# Patient Record
Sex: Male | Born: 1940 | Race: White | Hispanic: No | Marital: Married | Smoking: Former smoker
Health system: Southern US, Community
[De-identification: ages and names within clinical notes are randomized; demographics above are authoritative.]

## PROBLEM LIST (undated history)

## (undated) DIAGNOSIS — Z951 Presence of aortocoronary bypass graft: Secondary | ICD-10-CM

## (undated) DIAGNOSIS — E785 Hyperlipidemia, unspecified: Secondary | ICD-10-CM

## (undated) DIAGNOSIS — IMO0002 Reserved for concepts with insufficient information to code with codable children: Secondary | ICD-10-CM

## (undated) DIAGNOSIS — IMO0001 Reserved for inherently not codable concepts without codable children: Secondary | ICD-10-CM

## (undated) DIAGNOSIS — I350 Nonrheumatic aortic (valve) stenosis: Secondary | ICD-10-CM

## (undated) DIAGNOSIS — Z9289 Personal history of other medical treatment: Secondary | ICD-10-CM

## (undated) DIAGNOSIS — E119 Type 2 diabetes mellitus without complications: Secondary | ICD-10-CM

## (undated) DIAGNOSIS — C159 Malignant neoplasm of esophagus, unspecified: Secondary | ICD-10-CM

## (undated) DIAGNOSIS — E039 Hypothyroidism, unspecified: Secondary | ICD-10-CM

## (undated) DIAGNOSIS — K8020 Calculus of gallbladder without cholecystitis without obstruction: Secondary | ICD-10-CM

## (undated) DIAGNOSIS — I10 Essential (primary) hypertension: Secondary | ICD-10-CM

## (undated) DIAGNOSIS — I2510 Atherosclerotic heart disease of native coronary artery without angina pectoris: Secondary | ICD-10-CM

## (undated) HISTORY — DX: Hyperlipidemia, unspecified: E78.5

## (undated) HISTORY — DX: Type 2 diabetes mellitus without complications: E11.9

## (undated) HISTORY — DX: Atherosclerotic heart disease of native coronary artery without angina pectoris: I25.10

## (undated) HISTORY — DX: Personal history of other medical treatment: Z92.89

## (undated) HISTORY — DX: Hypothyroidism, unspecified: E03.9

## (undated) HISTORY — DX: Presence of aortocoronary bypass graft: Z95.1

## (undated) HISTORY — DX: Essential (primary) hypertension: I10

## (undated) HISTORY — DX: Nonrheumatic aortic (valve) stenosis: I35.0

## (undated) HISTORY — DX: Calculus of gallbladder without cholecystitis without obstruction: K80.20

---

## 1999-04-12 ENCOUNTER — Encounter: Payer: Self-pay | Admitting: *Deleted

## 1999-04-12 ENCOUNTER — Ambulatory Visit (HOSPITAL_COMMUNITY): Admission: RE | Admit: 1999-04-12 | Discharge: 1999-04-12 | Payer: Self-pay | Admitting: *Deleted

## 2000-07-09 HISTORY — PX: CORONARY ARTERY BYPASS GRAFT: SHX141

## 2000-11-01 ENCOUNTER — Ambulatory Visit (HOSPITAL_COMMUNITY): Admission: RE | Admit: 2000-11-01 | Discharge: 2000-11-01 | Payer: Self-pay | Admitting: *Deleted

## 2000-11-01 ENCOUNTER — Encounter: Payer: Self-pay | Admitting: *Deleted

## 2001-01-08 ENCOUNTER — Encounter: Payer: Self-pay | Admitting: Surgery

## 2001-01-13 ENCOUNTER — Inpatient Hospital Stay (HOSPITAL_COMMUNITY): Admission: RE | Admit: 2001-01-13 | Discharge: 2001-01-17 | Payer: Self-pay | Admitting: Surgery

## 2001-01-13 ENCOUNTER — Encounter: Payer: Self-pay | Admitting: Surgery

## 2001-01-14 ENCOUNTER — Encounter: Payer: Self-pay | Admitting: Surgery

## 2001-02-18 ENCOUNTER — Encounter (HOSPITAL_COMMUNITY): Admission: RE | Admit: 2001-02-18 | Discharge: 2001-05-19 | Payer: Self-pay | Admitting: *Deleted

## 2001-10-09 ENCOUNTER — Encounter: Payer: Self-pay | Admitting: *Deleted

## 2001-10-09 ENCOUNTER — Ambulatory Visit (HOSPITAL_COMMUNITY): Admission: RE | Admit: 2001-10-09 | Discharge: 2001-10-09 | Payer: Self-pay | Admitting: *Deleted

## 2007-03-20 ENCOUNTER — Ambulatory Visit: Payer: Self-pay | Admitting: Cardiology

## 2007-03-31 ENCOUNTER — Encounter: Payer: Self-pay | Admitting: Cardiology

## 2007-03-31 ENCOUNTER — Ambulatory Visit: Payer: Self-pay

## 2007-03-31 ENCOUNTER — Encounter: Payer: Self-pay | Admitting: Cardiovascular Disease

## 2007-04-02 ENCOUNTER — Ambulatory Visit: Payer: Self-pay

## 2008-02-03 ENCOUNTER — Ambulatory Visit: Payer: Self-pay | Admitting: Gastroenterology

## 2008-02-17 ENCOUNTER — Encounter: Payer: Self-pay | Admitting: Family Medicine

## 2008-02-17 ENCOUNTER — Ambulatory Visit: Payer: Self-pay | Admitting: Gastroenterology

## 2008-02-17 ENCOUNTER — Encounter: Payer: Self-pay | Admitting: Gastroenterology

## 2008-02-20 ENCOUNTER — Encounter: Payer: Self-pay | Admitting: Gastroenterology

## 2008-03-22 ENCOUNTER — Ambulatory Visit: Payer: Self-pay | Admitting: Cardiology

## 2008-04-13 DIAGNOSIS — I2510 Atherosclerotic heart disease of native coronary artery without angina pectoris: Secondary | ICD-10-CM

## 2008-04-13 DIAGNOSIS — I10 Essential (primary) hypertension: Secondary | ICD-10-CM

## 2008-04-13 HISTORY — DX: Essential (primary) hypertension: I10

## 2008-04-13 HISTORY — DX: Atherosclerotic heart disease of native coronary artery without angina pectoris: I25.10

## 2008-10-25 ENCOUNTER — Ambulatory Visit: Payer: Self-pay | Admitting: Internal Medicine

## 2009-01-25 ENCOUNTER — Encounter (INDEPENDENT_AMBULATORY_CARE_PROVIDER_SITE_OTHER): Payer: Self-pay | Admitting: *Deleted

## 2009-03-13 ENCOUNTER — Encounter: Payer: Self-pay | Admitting: Family Medicine

## 2009-04-05 ENCOUNTER — Encounter: Payer: Self-pay | Admitting: Family Medicine

## 2009-10-31 ENCOUNTER — Ambulatory Visit: Payer: Self-pay | Admitting: Family Medicine

## 2010-05-08 ENCOUNTER — Ambulatory Visit: Payer: Self-pay | Admitting: Family Medicine

## 2010-05-08 DIAGNOSIS — E039 Hypothyroidism, unspecified: Secondary | ICD-10-CM

## 2010-05-08 DIAGNOSIS — E7800 Pure hypercholesterolemia, unspecified: Secondary | ICD-10-CM

## 2010-05-08 DIAGNOSIS — E119 Type 2 diabetes mellitus without complications: Secondary | ICD-10-CM

## 2010-05-08 DIAGNOSIS — E785 Hyperlipidemia, unspecified: Secondary | ICD-10-CM

## 2010-05-08 DIAGNOSIS — E1169 Type 2 diabetes mellitus with other specified complication: Secondary | ICD-10-CM

## 2010-05-08 HISTORY — DX: Type 2 diabetes mellitus without complications: E11.9

## 2010-05-08 HISTORY — DX: Hyperlipidemia, unspecified: E78.5

## 2010-05-08 HISTORY — DX: Hypothyroidism, unspecified: E03.9

## 2010-05-08 LAB — CONVERTED CEMR LAB
Cholesterol, target level: 200 mg/dL
HDL goal, serum: 40 mg/dL
LDL Goal: 70 mg/dL

## 2010-05-11 ENCOUNTER — Telehealth: Payer: Self-pay | Admitting: Family Medicine

## 2010-05-15 ENCOUNTER — Ambulatory Visit: Payer: Self-pay | Admitting: Family Medicine

## 2010-05-15 LAB — CONVERTED CEMR LAB
ALT: 22 units/L (ref 0–53)
AST: 31 units/L (ref 0–37)
Albumin: 4.2 g/dL (ref 3.5–5.2)
Alkaline Phosphatase: 50 units/L (ref 39–117)
BUN: 22 mg/dL (ref 6–23)
Basophils Absolute: 0 10*3/uL (ref 0.0–0.1)
Basophils Relative: 0.6 % (ref 0.0–3.0)
Bilirubin Urine: NEGATIVE
Bilirubin, Direct: 0.2 mg/dL (ref 0.0–0.3)
Blood in Urine, dipstick: NEGATIVE
CO2: 28 meq/L (ref 19–32)
Calcium: 9.7 mg/dL (ref 8.4–10.5)
Chloride: 105 meq/L (ref 96–112)
Cholesterol: 110 mg/dL (ref 0–200)
Creatinine, Ser: 1.2 mg/dL (ref 0.4–1.5)
Eosinophils Absolute: 0.2 10*3/uL (ref 0.0–0.7)
Eosinophils Relative: 2.5 % (ref 0.0–5.0)
GFR calc non Af Amer: 67.39 mL/min (ref >60)
Glucose, Bld: 88 mg/dL (ref 70–99)
Glucose, Urine, Semiquant: NEGATIVE
HCT: 41.7 % (ref 39.0–52.0)
HDL: 31.3 mg/dL — ABNORMAL LOW (ref >39.00)
Hemoglobin: 14.2 g/dL (ref 13.0–17.0)
Ketones, urine, test strip: NEGATIVE
LDL Cholesterol: 54 mg/dL (ref 0–99)
Lymphocytes Relative: 20.2 % (ref 12.0–46.0)
Lymphs Abs: 1.4 10*3/uL (ref 0.7–4.0)
MCHC: 34.1 g/dL (ref 30.0–36.0)
MCV: 92.3 fL (ref 78.0–100.0)
Monocytes Absolute: 0.8 10*3/uL (ref 0.1–1.0)
Monocytes Relative: 10.7 % (ref 3.0–12.0)
Neutro Abs: 4.7 10*3/uL (ref 1.4–7.7)
Neutrophils Relative %: 66 % (ref 43.0–77.0)
Nitrite: NEGATIVE
PSA: 1.18 ng/mL (ref 0.10–4.00)
Platelets: 189 10*3/uL (ref 150.0–400.0)
Potassium: 4.3 meq/L (ref 3.5–5.1)
Protein, U semiquant: NEGATIVE
RBC: 4.52 M/uL (ref 4.22–5.81)
RDW: 12.9 % (ref 11.5–14.6)
Sodium: 142 meq/L (ref 135–145)
Specific Gravity, Urine: <1.005
TSH: 0.89 microintl units/mL (ref 0.35–5.50)
Total Bilirubin: 0.9 mg/dL (ref 0.3–1.2)
Total CHOL/HDL Ratio: 4
Total Protein: 6.9 g/dL (ref 6.0–8.3)
Triglycerides: 125 mg/dL (ref 0.0–149.0)
Urobilinogen, UA: 0.2
VLDL: 25 mg/dL (ref 0.0–40.0)
WBC Urine, dipstick: NEGATIVE
WBC: 7.1 10*3/uL (ref 4.5–10.5)
pH: 5

## 2010-05-22 ENCOUNTER — Ambulatory Visit: Payer: Self-pay | Admitting: Family Medicine

## 2010-07-14 ENCOUNTER — Encounter: Payer: Self-pay | Admitting: Family Medicine

## 2010-07-18 ENCOUNTER — Encounter: Payer: Self-pay | Admitting: Family Medicine

## 2010-07-31 ENCOUNTER — Ambulatory Visit: Admit: 2010-07-31 | Payer: Self-pay | Admitting: Family Medicine

## 2010-08-08 NOTE — Assessment & Plan Note (Signed)
Summary: BRAND NEW PT/TO EST/CJR   Vital Signs:  Patient profile:   70 year old male Height:      70.75 inches Weight:      268 pounds BMI:     37.78 Temp:     97.9 degrees F oral Pulse rate:   60 / minute Pulse rhythm:   regular Resp:     12 per minute BP sitting:   150 / 80  (left arm) Cuff size:   large  Vitals Entered By: Sid Falcon LPN (May 08, 2010 2:07 PM)  Nutrition Counseling: Patient's BMI is greater than 25 and therefore counseled on weight management options. CC: Hypertension Management, Lipid Management   History of Present Illness: Patient seen to establish care.  Past medical history significant for CAD with history of coronary artery bypass graft 2002. He also has history of hypothyroidism, hypertension, hyperlipidemia. Patient takes several medications and these are reviewed. He is compliant with all. Also prior history of left cataract surgery.  Family history reviewed. Significant for hyperlipidemia and hypertension. Mother with type 2 diabetes.  Patient also relates history of type 2 diabetes which has been managed with diet alone. last CPE about one year ago and old records reviewed.  He is a retired Charity fundraiser. He is married. Nonsmoker.  Hypertension History:      He denies headache, chest pain, palpitations, dyspnea with exertion, orthopnea, peripheral edema, visual symptoms, neurologic problems, syncope, and side effects from treatment.        Positive major cardiovascular risk factors include male age 42 years old or older, diabetes, hyperlipidemia, and hypertension.  Negative major cardiovascular risk factors include non-tobacco-user status.        Positive history for target organ damage include ASHD (either angina/prior MI/prior CABG).  Further assessment for target organ damage reveals no history of stroke/TIA or peripheral vascular disease.    Lipid Management History:      Positive NCEP/ATP III risk factors include male age 75 years old or  older, diabetes, hypertension, and ASHD (either angina/prior MI/prior CABG).  Negative NCEP/ATP III risk factors include non-tobacco-user status, no prior stroke/TIA, no peripheral vascular disease, and no history of aortic aneurysm.      Preventive Screening-Counseling & Management  Alcohol-Tobacco     Smoking Status: quit < 6 months     Packs/Day: 1.5     Year Started: 1962     Year Quit: 2001  Allergies (verified): No Known Drug Allergies  Past History:  Past Surgical History: Last updated: 04/13/2008 CABG 2002  Family History: Last updated: 05/08/2010 Family History of Coronary Artery Disease: in brother and mother Family History of Alcoholism/Addiction, colon CA,father Mother, diabetes type ll, hypertension  Social History: Last updated: 05/08/2010 Tobacco Use - Former. Quit 2002 Alcohol Use - yes occasional Retired Charity fundraiser Alcohol use-yes Quit smoking 2001 Married  Risk Factors: Smoking Status: quit < 6 months (05/08/2010) Packs/Day: 1.5 (05/08/2010)  Past Medical History: CAD Hyperlipidemia Hypertension Hypothyroidism radiation Type 2 DM diet managed PMH-FH-SH reviewed for relevance  Family History: Family History of Coronary Artery Disease: in brother and mother Family History of Alcoholism/Addiction, colon CA,father Mother, diabetes type ll, hypertension  Social History: Tobacco Use - Former. Quit 2002 Alcohol Use - yes occasional Retired Charity fundraiser Alcohol use-yes Quit smoking 2001 Married Smoking Status:  quit < 6 months Packs/Day:  1.5  Review of Systems  The patient denies anorexia, fever, weight loss, vision loss, chest pain, syncope, dyspnea on exertion, peripheral edema, prolonged cough, headaches, hemoptysis, abdominal  pain, melena, hematochezia, severe indigestion/heartburn, hematuria, muscle weakness, and depression.    Physical Exam  General:  Well-developed,well-nourished,in no acute distress; alert,appropriate and cooperative  throughout examination Eyes:  pupils equal, pupils round, and pupils reactive to light.   Mouth:  Oral mucosa and oropharynx without lesions or exudates.  Teeth in good repair. Neck:  No deformities, masses, or tenderness noted. Lungs:  Normal respiratory effort, chest expands symmetrically. Lungs are clear to auscultation, no crackles or wheezes. Heart:  normal rate, regular rhythm, and no gallop.  2/6 systolic murmur right upper sternal border and left sternal border Extremities:  No clubbing, cyanosis, edema, or deformity noted with normal full range of motion of all joints.   Neurologic:  alert & oriented X3 and cranial nerves II-XII intact.   Psych:  normally interactive, good eye contact, not anxious appearing, and not depressed appearing.     Impression & Recommendations:  Problem # 1:  CAD, NATIVE VESSEL (ICD-414.01)  His updated medication list for this problem includes:    Diovan Hct 80-12.5 Mg Tabs (Valsartan-hydrochlorothiazide) ..... Once daily    Metoprolol Tartrate 50 Mg Tabs (Metoprolol tartrate) ..... Once daily    Amlodipine Besy-benazepril Hcl 5-20 Mg Caps (Amlodipine besy-benazepril hcl) ..... Once daily  Problem # 2:  HYPERTENSION, BENIGN (ICD-401.1)  His updated medication list for this problem includes:    Diovan Hct 80-12.5 Mg Tabs (Valsartan-hydrochlorothiazide) ..... Once daily    Metoprolol Tartrate 50 Mg Tabs (Metoprolol tartrate) ..... Once daily    Amlodipine Besy-benazepril Hcl 5-20 Mg Caps (Amlodipine besy-benazepril hcl) ..... Once daily  Problem # 3:  HYPERLIPIDEMIA (ICD-272.4) refilled med and he will schedule lab. His updated medication list for this problem includes:    Vytorin 10-40 Mg Tabs (Ezetimibe-simvastatin) ..... Once daily  Problem # 4:  HYPOTHYROIDISM (ICD-244.9)  His updated medication list for this problem includes:    Synthroid 50 Mcg Tabs (Levothyroxine sodium) ..... Once daily    Synthroid 200 Mcg Tabs (Levothyroxine sodium)  ..... Once daily  Problem # 5:  DM (ICD-250.00)  His updated medication list for this problem includes:    Diovan Hct 80-12.5 Mg Tabs (Valsartan-hydrochlorothiazide) ..... Once daily    Amlodipine Besy-benazepril Hcl 5-20 Mg Caps (Amlodipine besy-benazepril hcl) ..... Once daily  Complete Medication List: 1)  Synthroid 50 Mcg Tabs (Levothyroxine sodium) .... Once daily 2)  Synthroid 200 Mcg Tabs (Levothyroxine sodium) .... Once daily 3)  Diovan Hct 80-12.5 Mg Tabs (Valsartan-hydrochlorothiazide) .... Once daily 4)  Vytorin 10-40 Mg Tabs (Ezetimibe-simvastatin) .... Once daily 5)  Metoprolol Tartrate 50 Mg Tabs (Metoprolol tartrate) .... Once daily 6)  Betamethasone Dipropionate 0.05 % Lotn (Betamethasone dipropionate) .... As needed 7)  Amlodipine Besy-benazepril Hcl 5-20 Mg Caps (Amlodipine besy-benazepril hcl) .... Once daily  Hypertension Assessment/Plan:      The patient's hypertensive risk group is category C: Target organ damage and/or diabetes.  Today's blood pressure is 150/80.    Lipid Assessment/Plan:      Based on NCEP/ATP III, the patient's risk factor category is "history of coronary disease, peripheral vascular disease, cerebrovascular disease, or aortic aneurysm along with either diabetes, current smoker, or LDL > 130 plus HDL < 40 plus triglycerides > 200".  The patient's lipid goals are as follows: Total cholesterol goal is 200; LDL cholesterol goal is 70; HDL cholesterol goal is 40; Triglyceride goal is 150.    Patient Instructions: 1)  Schedule complete physical examination Prescriptions: VYTORIN 10-40 MG TABS (EZETIMIBE-SIMVASTATIN) once daily  #90 x 3  Entered and Authorized by:   Evelena Peat MD   Signed by:   Evelena Peat MD on 05/08/2010   Method used:   Faxed to ...       [facility]       [address] [place], [place]  [address]       Ph: [phone]       Fax: [phone]   RxID:   [id number]    Orders Added: 1)  New  Patient Level III [84132]    Preventive Care Screening  Last Pneumovax:    Date:  07/09/2008    Results:  given   Last Tetanus Booster:    Date:  07/09/2008    Results:  Historical

## 2010-08-08 NOTE — Assessment & Plan Note (Signed)
Summary: cpx/ccm   Vital Signs:  Patient profile:   70 year old male Height:      71 inches Weight:      264 pounds Temp:     97.8 degrees F oral Pulse rate:   60 / minute Pulse rhythm:   regular Resp:     12 per minute BP sitting:   140 / 70  (left arm) Cuff size:   large  Vitals Entered By: Sid Falcon LPN (May 22, 2010 11:39 AM)  History of Present Illness: Here for CPE.  Has received flu vaccine.  Colonoscopy 2009. Recently started exercising 20-30 minutes most days of week. Has lost about 4-5 pounds since last visit.  Tetanus and PVX up to date.  PMH reviewed.  Hx CABG 2002.  Hyperlipidemia treated for several years with Vytorin 40 mg. Also on amlodipine but pt has never had any problems with myalgias on this combo.  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  Type 2 diabetes which has been diet controlled.  Hypertension and meds reviewed.  Compliant with all with no side effects.  Diabetes Management History:      He has not been enrolled in the "Diabetic Education Program".  He states understanding of dietary principles and is following his diet appropriately.  No sensory loss is reported.  Self foot exams are being performed.  He is checking home blood sugars.  He says that he is exercising.        Hypoglycemic symptoms are not occurring.  No hyperglycemic symptoms are reported.    Hypertension History:      He denies headache, chest pain, palpitations, dyspnea with exertion, orthopnea, PND, peripheral edema, visual symptoms, neurologic problems, syncope, and side effects from treatment.        Positive major cardiovascular risk factors include male age 45 years old or older, diabetes, hyperlipidemia, and hypertension.  Negative major cardiovascular risk factors include non-tobacco-user status.        Positive history for target organ damage include ASHD (either angina/prior MI/prior CABG).  Further assessment for target organ damage reveals no  history of stroke/TIA or peripheral vascular disease.    Lipid Management History:      Positive NCEP/ATP III risk factors include male age 68 years old or older, diabetes, HDL cholesterol less than 40, hypertension, and ASHD (either angina/prior MI/prior CABG).  Negative NCEP/ATP III risk factors include non-tobacco-user status, no prior stroke/TIA, no peripheral vascular disease, and no history of aortic aneurysm.      Preventive Screening-Counseling & Management  Caffeine-Diet-Exercise     Does Patient Exercise: yes  Allergies (verified): No Known Drug Allergies  Past History:  Past Medical History: Last updated: 05/08/2010 CAD Hyperlipidemia Hypertension Hypothyroidism radiation Type 2 DM diet managed  Past Surgical History: Last updated: 04/13/2008 CABG 2002  Family History: Last updated: 05/08/2010 Family History of Coronary Artery Disease: in brother and mother Family History of Alcoholism/Addiction, colon CA,father Mother, diabetes type ll, hypertension  Social History: Last updated: 05/08/2010 Tobacco Use - Former. Quit 2002 Alcohol Use - yes occasional Retired Charity fundraiser Alcohol use-yes Quit smoking 2001 Married  Risk Factors: Smoking Status: quit < 6 months (05/08/2010) Packs/Day: 1.5 (05/08/2010) PMH-FH-SH reviewed for relevance  Social History: Does Patient Exercise:  yes  Review of Systems  The patient denies anorexia, fever, weight gain, vision loss, decreased hearing, hoarseness, chest pain, syncope, dyspnea on exertion, peripheral edema, prolonged cough, headaches, hemoptysis, abdominal pain, melena, hematochezia, severe indigestion/heartburn, hematuria, incontinence, muscle weakness,  suspicious skin lesions, transient blindness, depression, and enlarged lymph nodes.    Physical Exam  General:  Well-developed,well-nourished,in no acute distress; alert,appropriate and cooperative throughout examination Head:  Normocephalic and atraumatic without  obvious abnormalities. No apparent alopecia or balding. Eyes:  pupils equal, pupils round, pupils reactive to light, no optic disk abnormalities, and no retinal abnormalitiies.   Ears:  External ear exam shows no significant lesions or deformities.  Otoscopic examination reveals clear canals, tympanic membranes are intact bilaterally without bulging, retraction, inflammation or discharge. Hearing is grossly normal bilaterally. Mouth:  Oral mucosa and oropharynx without lesions or exudates.  Teeth in good repair. Neck:  No deformities, masses, or tenderness noted. Lungs:  Normal respiratory effort, chest expands symmetrically. Lungs are clear to auscultation, no crackles or wheezes. Heart:  normal rate and regular rhythm.   Abdomen:  soft, non-tender, normal bowel sounds, no distention, no masses, no hepatomegaly, and no splenomegaly.   Rectal:  No external abnormalities noted. Normal sphincter tone. No rectal masses or tenderness. Prostate:  Prostate gland firm and smooth, no enlargement, nodularity, tenderness, mass, asymmetry or induration. Msk:  No deformity or scoliosis noted of thoracic or lumbar spine.   Extremities:  No clubbing, cyanosis, edema, or deformity noted with normal full range of motion of all joints.   Neurologic:  alert & oriented X3 and cranial nerves II-XII intact.   Skin:  no suspicious lesions.   Cervical Nodes:  No lymphadenopathy noted Psych:  normally interactive, good eye contact, not anxious appearing, and not depressed appearing.    Diabetes Management Exam:    Foot Exam (with socks and/or shoes not present):       Sensory-Pinprick/Light touch:          Left medial foot (L-4): normal          Left dorsal foot (L-5): normal          Left lateral foot (S-1): normal          Right medial foot (L-4): normal          Right dorsal foot (L-5): normal          Right lateral foot (S-1): normal       Sensory-Monofilament:          Left foot: normal          Right foot:  normal       Inspection:          Left foot: normal          Right foot: normal       Nails:          Left foot: normal          Right foot: normal    Eye Exam:       Eye Exam done here today          Results: normal   Impression & Recommendations:  Problem # 1:  Preventive Health Care (ICD-V70.0) immunizations up to date and recent colonoscopy.  Labs reviewed with pt. Needs to lose more weight and is encouraged to cont regular exercise. Discussed weight loss strategies.  Problem # 2:  DM (ICD-250.00)  His updated medication list for this problem includes:    Diovan Hct 80-12.5 Mg Tabs (Valsartan-hydrochlorothiazide) ..... Once daily    Amlodipine Besy-benazepril Hcl 5-20 Mg Caps (Amlodipine besy-benazepril hcl) ..... Once daily    Aspirin 325 Mg Tabs (Aspirin) ..... Once daily  Problem # 3:  HYPERLIPIDEMIA (ICD-272.4) discussed with pt.  Since no myalgias on this regimen for years, elected to  make no changes in this med. His updated medication list for this problem includes:    Vytorin 10-40 Mg Tabs (Ezetimibe-simvastatin) ..... Once daily  Problem # 4:  HYPOTHYROIDISM (ICD-244.9)  His updated medication list for this problem includes:    Synthroid 50 Mcg Tabs (Levothyroxine sodium) ..... Once daily    Synthroid 200 Mcg Tabs (Levothyroxine sodium) ..... Once daily  Problem # 5:  HYPERTENSION, BENIGN (ICD-401.1)  His updated medication list for this problem includes:    Diovan Hct 80-12.5 Mg Tabs (Valsartan-hydrochlorothiazide) ..... Once daily    Metoprolol Tartrate 50 Mg Tabs (Metoprolol tartrate) ..... Once daily    Amlodipine Besy-benazepril Hcl 5-20 Mg Caps (Amlodipine besy-benazepril hcl) ..... Once daily  Problem # 6:  CAD, NATIVE VESSEL (ICD-414.01)  His updated medication list for this problem includes:    Diovan Hct 80-12.5 Mg Tabs (Valsartan-hydrochlorothiazide) ..... Once daily    Metoprolol Tartrate 50 Mg Tabs (Metoprolol tartrate) ..... Once daily     Amlodipine Besy-benazepril Hcl 5-20 Mg Caps (Amlodipine besy-benazepril hcl) ..... Once daily    Aspirin 325 Mg Tabs (Aspirin) ..... Once daily  Complete Medication List: 1)  Synthroid 50 Mcg Tabs (Levothyroxine sodium) .... Once daily 2)  Synthroid 200 Mcg Tabs (Levothyroxine sodium) .... Once daily 3)  Diovan Hct 80-12.5 Mg Tabs (Valsartan-hydrochlorothiazide) .... Once daily 4)  Vytorin 10-40 Mg Tabs (Ezetimibe-simvastatin) .... Once daily 5)  Metoprolol Tartrate 50 Mg Tabs (Metoprolol tartrate) .... Once daily 6)  Betamethasone Dipropionate 0.05 % Lotn (Betamethasone dipropionate) .... As needed 7)  Amlodipine Besy-benazepril Hcl 5-20 Mg Caps (Amlodipine besy-benazepril hcl) .... Once daily 8)  Aspirin 325 Mg Tabs (Aspirin) .... Once daily 9)  Multi Vitamin Mens Tabs (Multiple vitamin) .... Once daily  Diabetes Management Assessment/Plan:      The following lipid goals have been established for the patient: Total cholesterol goal of 200; LDL cholesterol goal of 70; HDL cholesterol goal of 40; Triglyceride goal of 150.    Hypertension Assessment/Plan:      The patient's hypertensive risk group is category C: Target organ damage and/or diabetes.  Today's blood pressure is 140/70.    Lipid Assessment/Plan:      Based on NCEP/ATP III, the patient's risk factor category is "history of coronary disease, peripheral vascular disease, cerebrovascular disease, or aortic aneurysm along with either diabetes, current smoker, or LDL > 130 plus HDL < 40 plus triglycerides > 200".  The patient's lipid goals are as follows: Total cholesterol goal is 200; LDL cholesterol goal is 70; HDL cholesterol goal is 40; Triglyceride goal is 150.    Patient Instructions: 1)  Please schedule a follow-up appointment in 3 months .  2)  HgBA1c prior to visit  ICD-9: 250.00 3)  It is important that you exercise reguarly at least 20 minutes 5 times a week. If you develop chest pain, have severe difficulty breathing, or  feel very tired, stop exercising immediately and seek medical attention.  4)  You need to lose weight. Consider a lower calorie diet and regular exercise.  Prescriptions: AMLODIPINE BESY-BENAZEPRIL HCL 5-20 MG CAPS (AMLODIPINE BESY-BENAZEPRIL HCL) once daily  #90 x 3   Entered and Authorized by:   Evelena Peat MD   Signed by:   Evelena Peat MD on 05/22/2010   Method used:   Faxed to ...       [facility] ([facility]       [address]  [place], [place]  [address]       Ph: [phone]       Fax: [phone]   RxID:   [id number] METOPROLOL TARTRATE 50 MG TABS (METOPROLOL TARTRATE) once daily  #90 x 3   Entered and Authorized by:   Evelena Peat MD   Signed by:   Evelena Peat MD on 05/22/2010   Method used:   Faxed to ...       [facility] ([facility])       [address] [place], [place]  [address]       Ph: [phone]       Fax: [phone]   RxID:   [id number] DIOVAN HCT 80-12.5 MG TABS (VALSARTAN-HYDROCHLOROTHIAZIDE) once daily  #90 x 3   Entered and Authorized by:   Evelena Peat MD   Signed by:   Evelena Peat MD on 05/22/2010   Method used:   Faxed to ...       [facility] (mail[facility]order)       [address] [place], [place]  [address]       Ph: [phone]       Fax: [phone]   RxID:   [id number] SYNTHROID 200 MCG TABS (LEVOTHYROXINE SODIUM) once daily  #90 x 3   Entered and Authorized by:   Evelena Peat MD   Signed by:   Evelena Peat MD on 05/22/2010   Method used:   Faxed to ...       [facility] ([facility])       [address] [place], [place]  [address]       Ph: [phone]       Fax: [phone]   RxID:   [id number] SYNTHROID 50 MCG TABS (LEVOTHYROXINE SODIUM) once daily  #90 x 3   Entered and Authorized by:   Evelena Peat MD   Signed by:   Evelena Peat MD on 05/22/2010   Method used:   Faxed to ...       [facility] (mail[facility]order)       [address] [place], [place]  [address]       Ph:  [phone]       Fax: [phone]   RxID:   [id number]    Orders Added: 1)  Est. Patient 65& > [99397] 2)  Est. Patient Level IV [19509]   Immunization History:  Influenza Immunization History:    Influenza:  historical (04/19/2010)   Immunization History:  Influenza Immunization History:    Influenza:  Historical (04/19/2010)

## 2010-08-08 NOTE — Progress Notes (Signed)
Summary: [facility] med question  Phone Note From Pharmacy   Caller: [facility] Call For: Araina Butrick  Summary of Call: VM from Pharmacy calling with drug interaction precaution notice, Vytorin & Amlodipine.  Is it OK to take both together? [phone] Ref # [id number] Initial call taken by: Sid Falcon LPN,  May 11, 2010 1:15 PM  Follow-up for Phone Call        pt came to [place] on this regimen.  Since he has had no myalgias, I think he is OK to stay on this regimen. Follow-up by: Evelena Peat MD,  May 11, 2010 5:25 PM  Additional Follow-up for Phone Call Additional follow up Details #1::        Pharmacy informed, rx filled Additional Follow-up by: Sid Falcon LPN,  May 12, 2010 12:03 PM

## 2010-08-08 NOTE — Procedures (Signed)
Summary: Colonoscopy Report/[facility]  Colonoscopy Report/ [facility]   Imported By: Maryln Gottron 05/11/2010 09:50:33  _____________________________________________________________________  External Attachment:    Type:   Image     Comment:   External Document

## 2010-08-08 NOTE — Letter (Signed)
Summary: Annual Physical Exam/Dr. Bradd Canary  Annual Physical Exam/Dr. Bradd Canary   Imported By: Maryln Gottron 05/11/2010 09:53:45  _____________________________________________________________________  External Attachment:    Type:   Image     Comment:   External Document

## 2010-09-13 ENCOUNTER — Other Ambulatory Visit: Payer: Self-pay

## 2010-09-20 ENCOUNTER — Ambulatory Visit: Payer: Self-pay | Admitting: Family Medicine

## 2010-11-21 NOTE — Assessment & Plan Note (Signed)
[facility]                            CARDIOLOGY OFFICE NOTE   NAME:[name] [name] [name]                     MRN:          [id number]  DATE:03/20/2007                            DOB:          1941-01-02    The patient is a very pleasant 70 year old gentleman with a past medical  history of coronary artery disease, hypertension, hyperlipidemia, who  presents for re-establishment.  The patient does have a cardiac history.  In 2002, the patient underwent coronary artery bypass grafting secondary  to coronary artery disease.  At that time, he had a LIMA to the LAD,  saphenous vein graft to the obtuse marginal, and a sequential saphenous  vein graft to the posterior descending artery and posterior lateral  branch.  His most recent Myoview was performed on September 26, 2001.  The  ejection fraction was 55%.  There was a question of slight ischemia at  the apex.  His most recent echo was performed on October 25, 2000 and  showed normal LV function and aortic sclerosis.  The patient has not  been seen in this office since March of 2003.  He recently saw Dr. Artis [name]  and it was recommended that he reestablish.  Note, the patient denies  any dyspnea on exertion, orthopnea, PND, pedal edema, palpitations, pre-  syncope, syncope, or exertional chest pain.  He has no known drug  allergies.   PRESENT MEDICATIONS:  1. Diovan/hydrochlorothiazide 80/12.5 mg p.o. daily.  2. Aspirin 325 mg p.o. daily.  3. Multivitamin.  4. Amlodipine/benazepril 5/20 daily.  5. Synthroid 200 mcg p.o. daily.  6. Vytorin 10/40 daily.  7. Toprol 50 mg p.o. daily.   SOCIAL HISTORY:  He does not smoke at present, although he did up until  2002 at the time of his bypass surgery.  He does occasionally consume  alcohol.   FAMILY HISTORY:  Positive for coronary artery disease in his brother and  his mother.   PAST MEDICAL HISTORY:  Significant for hypertension, hyperlipidemia, but  there is no  diabetes mellitus.  He has a history of hyperthyroidism  treated with radiation, and now is hypothyroid.  He has a history of  coronary artery disease, as outlined in the HPI.  There is no other past  medical history noted.   REVIEW OF SYSTEMS:  He denies any headaches, fevers, or chills.  There  is no productive cough or hemoptysis.  There is no dysphagia,  odynophagia, melena, or hematochezia.  There is no dysuria or hematuria.  There is no rash or seizure activity.  There is no orthopnea, PND, or  pedal edema.  There is no claudication noted.  The remaining systems are  negative.   PHYSICAL EXAM:  Blood pressure 145/83, pulse 67.  He weighs 295 pounds.  He is well-developed and obese.  He is in no acute distress.  SKIN:  Warm and dry.  He does not appear to be depressed and there is no  peripheral clubbing.  BACK:  Normal.  HEENT:  Normal with normal eyelids.  NECK:  Supple with a normal upstroke bilaterally and I cannot  appreciate  bruits.  There is no jugular venous distension and no thyromegaly is  noted.  CHEST:  Clear to auscultation with normal expansion.  CARDIOVASCULAR:  Regular rate and rhythm.  There is a 2/6 systolic  murmur at the left sternal border.  S2 is not diminished.  There is no  S3 or S4.  ABDOMEN:  Nontender, nondistended.  Positive bowel sounds.  No  hepatosplenomegaly.  No mass appreciated.  There is no abdominal bruit.  He has 2+ femoral pulses bilaterally.  No bruits.  EXTREMITIES:  No edema and I can palpate no cords.  His distal pulses  are diminished.  NEUROLOGIC:  Grossly intact.   ELECTROCARDIOGRAM:  Sinus rhythm at a rate of 63.  The axis is normal.  There are inferior lateral T wave changes, which is new compared to July  2003.   DIAGNOSES:  1. Coronary artery disease status post coronary artery bypass grafting      - the patient is not having chest pain or shortness of breath, but      his electrocardiogram is much different compared to 5  years ago.      It has also been 6 years since his previous bypass surgery.  We      will plan to proceed with a stress Myoview.  If it shows normal      perfusion, then we will continue with medical therapy, including      his aspirin, ACE inhibitor, beta blocker, and Statin.  2. Hypertension - his blood pressure is borderline and we will track      this and adjust his regimen as indicated.  3. Hyperlipidemia - he will continue on his Vytorin for now.  Note, he      did have recent laboratories checked by Dr. Artis [name].  At that time,      his renal function was normal.  He also had normal liver functions.      Total cholesterol was 135 with an LDL of 62.  His HDL was minimally      decreased at 30.  4. Murmur - we will check an echocardiogram.   The patient will continue with risk factor modification.  We discussed  the importance of diet and exercise.  If the above studies are  unremarkable, then we will see him back in 1 year.     [name] Frieze Jens Som, MD, [facility]  Electronically Signed    BSC/MedQ  DD: 03/20/2007  DT: 03/20/2007  Job #: [id number]   cc:   [name] [name]. [name] [name], M.D.

## 2010-11-21 NOTE — Assessment & Plan Note (Signed)
[facility]                            CARDIOLOGY OFFICE NOTE   NAME:Fier, Burnis Kingfisher                     MRN:          [id number]  DATE:03/22/2008                            DOB:          03/29/1941    Mr. Axel is a very pleasant 70 year old gentleman who has a history of  coronary artery disease status post coronary artery bypassing graft,  hypertension, and hyperlipidemia.  When I last saw him on March 20, 2007, we scheduled him to have a followup Myoview.  This was performed  on March 31, 2007.  This showed an ejection fraction of 51%, and  there was no ischemia or infarction.  The patient also had an  echocardiogram performed secondary to a murmur at that time.  He was  found to have normal LV function and mild aortic stenosis with a mean  gradient of 10 mmHg.  There was trivial aortic insufficiency.  The left  atrium was mildly dilated.  Since that time, he is doing well from a  symptomatic standpoint.  He denies any dyspnea, chest pain,  palpitations, or syncope.  There is no pedal edema.   MEDICATIONS:  1. Diovan HCT 80/12.5 mg p.o. daily.  2. Aspirin 325 mg p.o. daily.  3. Multivitamin daily.  4. Amlodipine.  5. Benazepril 5/20 daily.  6. Synthroid 200 mcg p.o. daily.  7. Vytorin 10/40 daily.  8. Toprol 50 mg p.o. daily.  9. ACTOplus met 15/850 b.i.d.   PHYSICAL EXAMINATION:  VITAL SIGNS:  Today, shows a blood pressure of  136/68 and his pulse is 49.  HEENT:  Normal.  NECK:  Supple.  There are no bruits noted.  CHEST:  Clear.  CARDIOVASCULAR:  Regular.  ABDOMEN:  No tenderness.  EXTREMITIES:  Shows no edema.   His electrocardiogram shows a sinus bradycardia rate of 49.  The axis is  normal.  There are nonspecific T-wave changes.   DIAGNOSES:  1. Coronary artery disease status post coronary bypassing graft - Mr.      Chabot is doing well with no chest pain or shortness of breath.      His Myoview approximately 1 year  ago showed no ischemia.  We will      continue with medical therapy to include his aspirin, angiotensin-      converting enzyme inhibitor, beta-blocker, and statin.  2. Hypertension - His blood pressure is adequately controlled on his      present medications.  Dr. Artis Flock is following his renal function.  3. Hyperlipidemia - He will continue his Vytorin and this is being      followed by Dr. Artis Flock as well.  A goal LDL should be less than 70      given his history of coronary disease.  4. History of mild aortic stenosis - He will need a follow up      echocardiogram in 1 year when I see him back.  5. He will continue his risk factor modification including diet and      exercise.  He should not smoke.     Madolyn Frieze Crenshaw,  MD, [facility]  Electronically Signed    BSC/MedQ  DD: 03/22/2008  DT: 03/23/2008  Job #: [id number]   cc:   Quita Skye. Artis Flock, M.D.

## 2010-11-24 NOTE — Cardiovascular Report (Signed)
[place]  Patient:    [name], [name]                      MRN: [id number] Proc. Date: 11/01/00 Adm. Date:  [id number] Disc. Date: [id number] Attending:  Daisey Must CC:         Einar Crow, M.D.  Cecil Cranker, M.D. [facility]  Cardiac Catheterization Laboratory   Cardiac Catheterization  PROCEDURES PERFORMED:  Left heart catheterization with coronary angiography and left ventriculography.  INDICATIONS:  [name] is a 70 year old male with a history of coronary artery disease.  He has been asymptomatic but has had abnormal stress test in the past.  He recently underwent a stress Cardiolite scan on April 19.  This showed significant ECG abnormalities with exercise.  There is also mild left ventricular dilatation and dysfunction and evidence of ischemia in the distal inferolateral wall and apex.  This was felt to be a significant interval change compared to previous stress test.  He was thus referred for cardiac catheterization.  DESCRIPTION OF PROCEDURE:  A 6 French sheath was placed in the right femoral artery.  Standard Judkins 6 French catheters were utilized.  Contrast was Omnipaque.  There were no complications.  RESULTS:  HEMODYNAMICS:  Left ventricular pressure 140/24.  Aortic pressure 140/65.  No aortic valve gradient.  LEFT VENTRICULOGRAM:  Wall motion is normal.  Ejection fraction is calculated at 66%.  There is no mitral regurgitation.  CORONARY ARTERIOGRAPHY:  (Right dominant).  Left main:  Left main has moderate diffuse disease with 30% stenosis proximally and 30% stenosis distally.  In some views there appears to be an eccentric diffuse 50% stenosis, however, in other views the extent of disease in the left main does not appear as severe.  Left anterior descending:  The left anterior descending artery has a 40% stenosis at its origin.  The distal vessel has a 20% stenosis.  The LAD gives rise to three  relatively normal sized diagonal branches.  Left circumflex:  The left circumflex has a shelflike plaque at its origin with a 60% stenosis proximally.  There appears to be a stump of what may be an occluded ramus intermedius here, however, there is no collateral filling see of any distal vessel.  Therefore, this may be a portion of just plaque in the left circumflex.  The circumflex gives rise to a large branching first obtuse marginal which has a tubular 60% stenosis.  The second obtuse marginal is small.  Right coronary artery:  The right coronary artery is diffusely diseased. There is 60% stenosis in the proximal vessel, a long 70% stenosis in the mid vessel and a 70% stenosis in the distal vessel at the acute margin.  The right coronary artery gives rise to a normal sized acute marginal, normal sized posterior descending artery and a small posterolateral branch.  IMPRESSIONS: 1. Normal left ventricular systolic function. 2. Diffuse but moderate three-vessel coronary artery disease as    described.  PLAN:  The results of the patients stress Cardiolite are worrisome suggestive of three-vessel disease.  However, the severity of the patients disease appears to be moderate angiographically.  Furthermore, the patient has been asymptomatic.  At this point, the plan is to review the images with Dr. Corinda Gubler to determine the best long-term therapy.  Alternatives will be continued medical therapy versus coronary artery bypass surgery. DD:  11/01/00 TD:  11/03/00 Job: [id number]

## 2010-11-24 NOTE — Discharge Summary (Signed)
[place]. [facility]  Patient:    [name], [name]                      MRN: [id number] Adm. Date:  [id number] Disc. Date: 01/17/01 Attending:  Cleatrice Burke Dictator:   Tollie Pizza. Thomasena Edis, P.A.-C. CC:         Cecil Cranker, M.D. [facility]  Dr. Dareen Piano, [facility], [place], [place]   Discharge Summary  ADMITTING DIAGNOSES: 1. Three-vessel coronary artery disease. 2. Left main coronary disease. 3. Hypertension. 4. Hypercholesterolemia. 5. 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  PROCEDURE:  Coronary artery bypass graft x 4 with the following grafts: Saphenous vein graft sequentially to posterior descending and posterolateral arteries; left internal mammary artery to the left anterior descending.  HISTORY OF PRESENT ILLNESS:  The patient is a 70 year old male patient of Dr. Graceann Congress who was noted approximately 18 months ago to have an abnormal EKG on routine physical exam.  He had been asymptomatic, but further workup was undertaken.  He underwent a stress Cardiolite study in April 2002, which showed significant EKG changes with exercise.  He went for cardiac catheterization on November 01, 2000, and was found to have moderate left main coronary disease along with three-vessel coronary artery disease.  Since he has been asymptomatic, he has been followed since then and underwent a stress echocardiogram in May 2002, which showed dramatic ST changes with exercise. It was felt that it would be in his best interest to proceed with surgical intervention at this time in light of his significant left main disease despite the fact that he had been asymptomatic.  He was seen in the office by Dr. Laneta Simmers and was scheduled for the OR.  HOSPITAL COURSE:  He was admitted on January 13, 2001, and taken to the operating room where he underwent CABG x 4 with the above-noted grafts.  He tolerated the procedure well and was  transferred to SICU in stable condition.  He was extubated shortly after surgery.  On postop day #1, he was hemodynamically stable, awake, alert and was able to be moved to the floor.  Since that time, he has had an uneventful postoperative course.  He has been ambulating in the halls without difficulty.  He is tolerating a regular diet and is having normal bowel and bladder function.  He has remained afebrile and all vital signs have been stable.  His incisions are healing well at this time and his labs are within normal limits.  He is back to within two pounds of his preoperative weight.  It is felt that if he continues stable, he may be discharged in the a.m. on postop day #4.  DISCHARGE MEDICATIONS: 1. Enteric coated aspirin 325 mg daily. 2. Lopressor 50 mg b.i.d. 3. Zocor 40 mg q.d. 4. Synthroid 225 mcg q.d. 5. Tylox one to two q.4h. p.r.n. pain.  ACTIVITY:  He should refrain from driving or heavy lifting of anything more than 10 pounds.  He may continue daily walking and deep breathing exercises.  DIET:  Continue low fat, low sodium diet.  SPECIAL INSTRUCTIONS:  He may shower and clean his incision daily with soap and water.  He is asked to call our office if he experiences any redness, swelling or drainage of the incision sites or fever greater than 101.  His discharge instructions have been reviewed with him and a written copy will be sent home with him as  well.  He asked to call our office with any difficulties in the interim.  FOLLOWUP:  He will see Dr. Hardie Lora. on January 27, 2001, at 2:30 p.m.   He will have a chest x-ray done at that time.  He will then follow up with Dr. Laneta Simmers on February 11, 2001, at 9:15 a.m. in the [facility] office. DD:  01/16/01 TD:  01/16/01 Job: [id number]

## 2010-11-24 NOTE — Op Note (Signed)
[place]. [facility]  Patient:    [name], [name]                      MRN: [id number] Adm. Date:  [id number] Attending:  Cleatrice Burke CC:         Cecil Cranker, M.D. [facility] Cardiac Catheterization Lab   Operative Report  PREOPERATIVE DIAGNOSIS:  Left main and severe three-vessel coronary artery disease.  POSTOPERATIVE DIAGNOSIS:  Left main and severe three-vessel coronary artery disease.  PROCEDURE:  Median sternotomy, extracorporeal circulation, coronary artery bypass graft surgery x 4 using a left internal mammary artery graft to the left anterior descending coronary artery, with a saphenous vein graft to the obtuse marginal branch of the left circumflex coronary artery, and a sequential saphenous vein graft to the posterior descending and posterolateral branches of the right coronary artery.  SURGEON:  Alleen Borne, M.D.  ASSISTANT:  Adair Patter, P.A.  ANESTHESIA:  General endotracheal.  CLINICAL HISTORY:  This patient is a 70 year old gentleman with a history of coronary disease diagnosed about 18 months ago.  He was asymptomatic but had an abnormal electrocardiogram that prompted further workup.  He underwent a stress Cardiolite exam on October 25, 2000, which showed significant EKG abnormalities with exercise.  There was also mild left ventricular dilatation and dysfunction and evidence of ischemia in the distal inferolateral wall and apex that were felt to be different than his previous stress test.  He subsequently underwent cardiac catheterization on November 01, 2000.  This showed moderate left main and three-vessel disease.  The left main had about 30% proximal stenosis and 30% distal stenosis.  In some views there may have been up to 50% stenosis.  The LAD had 40% ostial stenosis.  The left circumflex had 60% ostial stenosis.  There was a large first marginal branch that had 60% stenosis.  The right coronary artery was  diffusely diseased with 60% proximal, 70% mid-, and 70% distal stenosis.  Left ventricular ejection fraction was 66% with no mitral regurgitation.  Since he was asymptomatic with only moderate stenoses, he underwent a stress echocardiogram on Nov 30, 2000.  This showed dramatic ST segment abnormalities during exercise with nondiagnostic echocardiographic changes.  After review of the angiogram and the other studies, it was felt that the best treatment was to proceed with coronary artery bypass graft surgery.  I discussed the operative procedure with him and his wife, including the alternatives, benefits, and risks, including bleeding, possible blood transfusion, infection, stroke, myocardial infarction, and death.  They understood and agreed to proceed.  DESCRIPTION OF PROCEDURE:  The patient was taken to the operating room and placed on the table in supine position.  After induction of general endotracheal anesthesia, a Foley catheter was placed in the bladder using sterile technique.  Then the chest, abdomen, and both lower extremities were prepped and draped in the usual sterile manner.  The chest was entered through a median sternotomy incision and the pericardium opened in the midline. Examination of the heart showed good ventricular contractility.  The ascending aorta had no palpable plaques in it.  Then the left internal mammary artery was harvested from the chest wall as a pedicle graft.  This was a medium-caliber vessel with excellent blood flow through it.  At the same time, a segment of greater saphenous vein was harvested from the right lower leg.  This vein was of medium size and good quality.  Then  the patient was heparinized and when an adequate activated clotting time was achieved, the distal ascending aorta was cannulated using a 22 French aortic cannula for aortic inflow.  Venous outflow was achieved using a two-stage venous cannula through the right atrial appendage.   An antegrade cardioplegia and vent cannula was inserted in the aortic root.  The patient was placed on cardiopulmonary bypass and distal coronary arteries identified.  The LAD was a large, graftable vessel.  The obtuse marginal vessel was a medium-sized, graftable vessel.  The posterior descending and posterolateral branches of the right coronary artery were medium-sized, graftable vessels.  Then the aorta was crossclamped and 500 cc of cold blood antegrade cardioplegia was administered in the aortic root with quick arrest of the heart.  Systemic hypothermia at 20 degrees Centigrade and topical hypothermia with iced saline was used.  A temperature probe was placed in the septum and an insulating pad in the pericardium.  The first distal anastomosis was performed to the obtuse marginal branch.  The internal diameter was 1.6 mm.  The conduit used was a segment of greater saphenous vein and the anastomosis performed in an end-to-side manner using continuous 7-0 Prolene suture.  Flow was measured through the graft and was excellent.  Then a dose of cardioplegia was given down the vein graft and in the aortic root.  The second distal anastomosis was performed to the posterior descending branch of the right coronary artery.  The internal diameter was 1.6 mm.  The conduit used was a second segment of greater saphenous vein and the anastomosis performed in a sequential side-to-side manner using continuous 7-0 Prolene suture.  Flow was measured through the graft and was excellent.  The third distal anastomosis was performed to the posterolateral branch of the right coronary artery.  The internal diameter was 1.6 mm.  The conduit used was the same segment of greater saphenous vein and the anastomosis performed in a sequential end-to-side manner using continuous 7-0 Prolene suture.  Flow was measured through the graft and was excellent.  Then another dose of cardioplegia was given down the vein  grafts and in the aortic root.   The fourth distal anastomosis was performed to the midportion of the left anterior descending coronary artery.  The internal diameter was about 2.5 mm. The conduit used was the left internal mammary graft, and this was brought through an opening in the left pericardium anterior to the phrenic nerve.  It was anastomosed to the LAD in an end-to-side manner using continuous 8-0 Prolene suture.  The pedicle was tacked to the epicardium with 6-0 Prolene sutures.  The patient was rewarmed to 37 degrees Centigrade and the clamp removed from the mammary pedicle.  There was rapid warming of the ventricular septum and return of spontaneous ventricular fibrillation.  The crossclamp was removed with a time of 64 minutes, and the patient defibrillated into sinus rhythm.  A partial occlusion clamp was placed on the aortic root, and the two proximal vein graft anastomoses were performed in end-to-side manner using continuous 6-0 Prolene suture.  The clamp was removed, the vein grafts de-aired, and the clamps removed from them.  The proximal and distal anastomoses appeared hemostatic and the alignment of the grafts satisfactory.  Graft markers were placed around the proximal anastomoses.  Two temporary right ventricular and right atrial pacing wires were placed and brought out through the skin.  When the patient had rewarmed to 37 degrees Centigrade, he was weaned from cardiopulmonary bypass on no  inotropic agents.  Total bypass time was 101 minutes.  Cardiac function appeared good with cardiac output of 5 L/min. Protamine was given, and the venous and aortic cannulas were removed without difficulty.  Hemostasis was achieved.  Three chest tubes were placed, with two in the posterior pericardium, one in the left pleural space, and one in the anterior mediastinum.  The pericardium was reapproximated to the heart.  The sternum was closed with #6 stainless steel wires.   Fascia was closed with continuous #1 Vicryl suture.  Subcutaneous tissue was closed using continuous 2-0 Vicryl, and the skin with 3-0 Vicryl subcuticular closure.  Lower extremity vein harvest site was closed in layers in a similar manner.  The sponge, needle, and instrument counts were correct according to the scrub nurse.  Dry sterile dressings were applied over the incisions, around the chest tubes, which were hooked to Pleuravac suction.  The patient remained hemodynamically stable and was transported to the SICU in guarded but stable condition. DD:  01/13/01 TD:  01/13/01 Job: [id number]

## 2010-11-28 ENCOUNTER — Other Ambulatory Visit: Payer: Self-pay

## 2010-12-05 ENCOUNTER — Ambulatory Visit: Payer: Self-pay | Admitting: Family Medicine

## 2011-01-30 ENCOUNTER — Other Ambulatory Visit: Payer: Self-pay

## 2011-02-06 ENCOUNTER — Ambulatory Visit: Payer: Self-pay | Admitting: Family Medicine

## 2011-04-23 ENCOUNTER — Other Ambulatory Visit: Payer: Self-pay

## 2011-04-30 ENCOUNTER — Ambulatory Visit: Payer: Self-pay | Admitting: Family Medicine

## 2011-08-24 ENCOUNTER — Other Ambulatory Visit (INDEPENDENT_AMBULATORY_CARE_PROVIDER_SITE_OTHER): Payer: BC Managed Care – PPO

## 2011-08-24 DIAGNOSIS — Z0000 Encounter for general adult medical examination without abnormal findings: Secondary | ICD-10-CM

## 2011-08-24 LAB — POCT URINALYSIS DIPSTICK
Bilirubin, UA: NEGATIVE
Leukocytes, UA: NEGATIVE
pH, UA: 5.5

## 2011-08-24 LAB — CBC WITH DIFFERENTIAL/PLATELET
Basophils Absolute: 0 10*3/uL (ref 0.0–0.1)
Eosinophils Absolute: 0.1 10*3/uL (ref 0.0–0.7)
HCT: 40.3 % (ref 39.0–52.0)
Hemoglobin: 13.8 g/dL (ref 13.0–17.0)
Lymphs Abs: 1.4 10*3/uL (ref 0.7–4.0)
MCHC: 34.2 g/dL (ref 30.0–36.0)
Monocytes Absolute: 0.8 10*3/uL (ref 0.1–1.0)
Monocytes Relative: 9.2 % (ref 3.0–12.0)
Neutro Abs: 6.1 10*3/uL (ref 1.4–7.7)
Platelets: 192 10*3/uL (ref 150.0–400.0)
RDW: 12.4 % (ref 11.5–14.6)

## 2011-08-24 LAB — BASIC METABOLIC PANEL
BUN: 33 mg/dL — ABNORMAL HIGH (ref 6–23)
CO2: 27 mEq/L (ref 19–32)
GFR: 60.97 mL/min (ref >60.00)
Glucose, Bld: 303 mg/dL — ABNORMAL HIGH (ref 70–99)
Potassium: 4.7 mEq/L (ref 3.5–5.1)
Sodium: 135 mEq/L (ref 135–145)

## 2011-08-24 LAB — MICROALBUMIN / CREATININE URINE RATIO
Creatinine,U: 91.1 mg/dL
Microalb Creat Ratio: 4.4 mg/g (ref 0.0–30.0)
Microalb, Ur: 4 mg/dL — ABNORMAL HIGH (ref 0.0–1.9)

## 2011-08-24 LAB — LIPID PANEL
Cholesterol: 153 mg/dL (ref 0–200)
HDL: 37.6 mg/dL — ABNORMAL LOW (ref >39.00)
Triglycerides: 352 mg/dL — ABNORMAL HIGH (ref 0.0–149.0)
VLDL: 70.4 mg/dL — ABNORMAL HIGH (ref 0.0–40.0)

## 2011-08-24 LAB — HEPATIC FUNCTION PANEL
AST: 19 U/L (ref 0–37)
Albumin: 4.1 g/dL (ref 3.5–5.2)
Total Bilirubin: 0.8 mg/dL (ref 0.3–1.2)

## 2011-08-24 LAB — TSH: TSH: 3.08 u[IU]/mL (ref 0.35–5.50)

## 2011-08-30 ENCOUNTER — Encounter: Payer: Self-pay | Admitting: Family Medicine

## 2011-08-30 ENCOUNTER — Ambulatory Visit (INDEPENDENT_AMBULATORY_CARE_PROVIDER_SITE_OTHER): Payer: BC Managed Care – PPO | Admitting: Family Medicine

## 2011-08-30 VITALS — BP 132/64 | HR 72 | Temp 98.3°F | Resp 12 | Ht 71.0 in | Wt 265.0 lb

## 2011-08-30 DIAGNOSIS — E039 Hypothyroidism, unspecified: Secondary | ICD-10-CM

## 2011-08-30 DIAGNOSIS — I10 Essential (primary) hypertension: Secondary | ICD-10-CM

## 2011-08-30 DIAGNOSIS — Z0000 Encounter for general adult medical examination without abnormal findings: Secondary | ICD-10-CM

## 2011-08-30 DIAGNOSIS — E119 Type 2 diabetes mellitus without complications: Secondary | ICD-10-CM

## 2011-08-30 DIAGNOSIS — E785 Hyperlipidemia, unspecified: Secondary | ICD-10-CM

## 2011-08-30 DIAGNOSIS — I2510 Atherosclerotic heart disease of native coronary artery without angina pectoris: Secondary | ICD-10-CM

## 2011-08-30 MED ORDER — METOPROLOL TARTRATE 50 MG PO TABS: 50.0000 mg | ORAL_TABLET | Freq: Every day | ORAL | Status: DC

## 2011-08-30 MED ORDER — EZETIMIBE-SIMVASTATIN 10-40 MG PO TABS: 1.0000 | ORAL_TABLET | Freq: Every day | ORAL | Status: DC

## 2011-08-30 MED ORDER — VALSARTAN-HYDROCHLOROTHIAZIDE 80-12.5 MG PO TABS: 1.0000 | ORAL_TABLET | Freq: Every day | ORAL | Status: DC

## 2011-08-30 MED ORDER — LEVOTHYROXINE SODIUM 50 MCG PO TABS: 50.0000 ug | ORAL_TABLET | Freq: Every day | ORAL | Status: DC

## 2011-08-30 MED ORDER — METFORMIN HCL 500 MG PO TABS: 500.0000 mg | ORAL_TABLET | Freq: Two times a day (BID) | ORAL | Status: DC

## 2011-08-30 MED ORDER — AMLODIPINE BESY-BENAZEPRIL HCL 5-20 MG PO CAPS: 1.0000 | ORAL_CAPSULE | Freq: Every day | ORAL | Status: DC

## 2011-08-30 MED ORDER — GLUCOSE BLOOD VI STRP: ORAL_STRIP | Status: DC

## 2011-08-30 MED ORDER — LEVOTHYROXINE SODIUM 200 MCG PO TABS: 200.0000 ug | ORAL_TABLET | Freq: Every day | ORAL | Status: DC

## 2011-08-30 NOTE — Progress Notes (Signed)
Subjective:    Patient ID: [name], male    DOB: [birth year], 71 y.o.   MRN: [id number]  HPI  Patient is here for well visit /complete physical and follow up medical problems. He has medical problems including history of CAD with bypass several years ago, hypertension, hyperlipidemia, hypothyroidism, and past history of type 2 diabetes. He was placed on some sort of oral medication several years ago by previous physician but lost a lot of weight and eventually stopped this.  He's had several weeks of increased thirst and urine frequency about 15 pounds weight loss since December. Not monitoring blood sugars. Scheduled for eye exam this Monday.  No history of shingles vaccine. Other immunizations up to date. Colonoscopy up-to-date. No regular exercise.  Blood pressures have been stable by home readings. No orthostasis. Denies recent chest pains. No dizziness.  Past Medical History  Diagnosis Date  . HYPOTHYROIDISM 05/08/2010  . DM 05/08/2010  . HYPERLIPIDEMIA 05/08/2010  . HYPERTENSION, BENIGN 04/13/2008  . CAD, NATIVE VESSEL 04/13/2008   Past Surgical History  Procedure Date  . Coronary artery bypass graft 2002    reports that he quit smoking about 12 years ago. His smoking use included Cigarettes. He has a 30 pack-year smoking history. He quit smokeless tobacco use about 11 years ago. He reports that he drinks alcohol. His drug history not on file. family history includes Alcohol abuse in his father; Cancer in his father; Coronary artery disease in his brother and mother; Diabetes in his mother; Diabetes type II in his mother; and Hypertension in his mother. No Known Allergies    Review of Systems  Constitutional: Positive for unexpected weight change. Negative for fever, activity change, appetite change and fatigue.  HENT: Negative for ear pain, congestion and trouble swallowing.   Eyes: Negative for pain and visual disturbance.  Respiratory: Negative for cough, shortness  of breath and wheezing.   Cardiovascular: Negative for chest pain and palpitations.  Gastrointestinal: Negative for nausea, vomiting, abdominal pain, diarrhea, constipation, blood in stool, abdominal distention and rectal pain.  Genitourinary: Positive for frequency. Negative for dysuria, hematuria and testicular pain.  Musculoskeletal: Negative for joint swelling and arthralgias.  Skin: Negative for rash.  Neurological: Negative for dizziness, syncope and headaches.  Hematological: Negative for adenopathy.  Psychiatric/Behavioral: Negative for confusion and dysphoric mood.       Objective:   Physical Exam  Constitutional: He is oriented to person, place, and time. He appears well-developed and well-nourished.  HENT:  Right Ear: External ear normal.  Left Ear: External ear normal.  Mouth/Throat: Oropharynx is clear and moist.  Neck: Neck supple. No thyromegaly present.  Cardiovascular: Normal rate and regular rhythm.   Pulmonary/Chest: Effort normal and breath sounds normal. No respiratory distress. He has no wheezes. He has no rales.  Abdominal: Soft. He exhibits no distension and no mass. There is no tenderness. There is no rebound and no guarding.  Musculoskeletal: He exhibits no edema.       Feet- normal sensation. Good distal pulses. Generally very dry but no calluses or ulcerations.  Lymphadenopathy:    He has no cervical adenopathy.  Neurological: He is alert and oriented to person, place, and time.  Skin: No rash noted.  Psychiatric: He has a normal mood and affect. His behavior is normal.          Assessment & Plan:  #1 health maintenance. Check coverage for shingles vaccine. Other immunizations up to date. Colonoscopy up to date #2 type 2  diabetes with very poor control. Currently not on medication. A1c over 15%. Start metformin 500 mg twice a day. Home glucose monitor with instructions for use. Record several readings and followup in 2 weeks. Dietary issues discussed.   #3 hypertension stable refill medications for one year  #4 hyperlipidemia. He has exacerbation with hypertriglyceridemia related to poor diabetes control. This should improve with diabetes control. LDL at goal #5 hypothyroidism. TSH at goal. Refill medication for one year  #6 history of CAD. Needs to reestablish with cardiologist. Discuss at followup

## 2011-08-30 NOTE — Patient Instructions (Addendum)
Scale back sugars and starches Check on insurance coverage for shingles vaccine Get eye exam as scheduled. Monitor blood sugars at least few times per week and bring with f/u visit.  Diabetes, Type 2 Diabetes is a long-lasting (chronic) disease. In type 2 diabetes, the pancreas does not make enough insulin (a hormone), and the body does not respond normally to the insulin that is made. This type of diabetes was also previously called adult-onset diabetes. It usually occurs after the age of 93, but it can occur at any age.  CAUSES  Type 2 diabetes happens because the pancreasis not making enough insulin or your body has trouble using the insulin that your pancreas does make properly. SYMPTOMS   Drinking more than usual.   Urinating more than usual.   Blurred vision.   Dry, itchy skin.   Frequent infections.   Feeling more tired than usual (fatigue).  DIAGNOSIS The diagnosis of type 2 diabetes is usually made by one of the following tests:  Fasting blood glucose test. You will not eat for at least 8 hours and then take a blood test.   Random blood glucose test. Your blood glucose (sugar) is checked at any time of the day regardless of when you ate.   Oral glucose tolerance test (OGTT). Your blood glucose is measured after you have not eaten (fasted) and then after you drink a glucose containing beverage.  TREATMENT   Healthy eating.   Exercise.   Medicine, if needed.   Monitoring blood glucose.   Seeing your caregiver regularly.  HOME CARE INSTRUCTIONS   Check your blood glucose at least once a day. More frequent monitoring may be necessary, depending on your medicines and on how well your diabetes is controlled. Your caregiver will advise you.   Take your medicine as directed by your caregiver.   Do not smoke.   Make wise food choices. Ask your caregiver for information. Weight loss can improve your diabetes.   Learn about low blood glucose (hypoglycemia) and how to  treat it.   Get your eyes checked regularly.   Have a yearly physical exam. Have your blood pressure checked and your blood and urine tested.   Wear a pendant or bracelet saying that you have diabetes.   Check your feet every night for cuts, sores, blisters, and redness. Let your caregiver know if you have any problems.  SEEK MEDICAL CARE IF:   You have problems keeping your blood glucose in target range.   You have problems with your medicines.   You have symptoms of an illness that do not improve after 24 hours.   You have a sore or wound that is not healing.   You notice a change in vision or a new problem with your vision.   You have a fever.  MAKE SURE YOU:  Understand these instructions.   Will watch your condition.   Will get help right away if you are not doing well or get worse.  Document Released: 06/25/2005 Document Revised: 03/08/2011 Document Reviewed: 12/11/2010 [facility] Patient Information 2012 [place], [place].

## 2011-09-14 ENCOUNTER — Ambulatory Visit (INDEPENDENT_AMBULATORY_CARE_PROVIDER_SITE_OTHER): Payer: BC Managed Care – PPO | Admitting: Family Medicine

## 2011-09-14 ENCOUNTER — Encounter: Payer: Self-pay | Admitting: Family Medicine

## 2011-09-14 VITALS — BP 130/68 | Temp 98.2°F | Wt 264.0 lb

## 2011-09-14 DIAGNOSIS — E119 Type 2 diabetes mellitus without complications: Secondary | ICD-10-CM

## 2011-09-14 DIAGNOSIS — Z299 Encounter for prophylactic measures, unspecified: Secondary | ICD-10-CM

## 2011-09-14 MED ORDER — ZOSTER VACCINE LIVE 19400 UNT/0.65ML ~~LOC~~ SOLR: 0.6500 mL | Freq: Once | SUBCUTANEOUS | Status: DC

## 2011-09-14 NOTE — Progress Notes (Signed)
  Subjective:    Patient ID: [name], male    DOB: Oct 16, 1940, 71 y.o.   MRN: [id number]  HPI  Followup from recent physical. Patient has type 2 diabetes, CAD history, hypertension and hypothyroidism.  Diabetes poorly controlled with A1c 15%. Had fasting blood sugars around 350. He start back on metformin and has had tremendous improvement with fasting from 110 and 120. Overall feels much better with increased energy. Walking 1 and1/2 miles per day on treadmill. He and wife  also made some dietary changes with reduction in simple sugars and carbohydrates. He has dyslipidemia with elevated triglycerides and explained poor sugar control is exacerbating that.  Past Medical History  Diagnosis Date  . HYPOTHYROIDISM 05/08/2010  . DM 05/08/2010  . HYPERLIPIDEMIA 05/08/2010  . HYPERTENSION, BENIGN 04/13/2008  . CAD, NATIVE VESSEL 04/13/2008   Past Surgical History  Procedure Date  . Coronary artery bypass graft 2002    reports that he quit smoking about 12 years ago. His smoking use included Cigarettes. He has a 30 pack-year smoking history. He quit smokeless tobacco use about 11 years ago. He reports that he drinks alcohol. His drug history not on file. family history includes Alcohol abuse in his father; Cancer in his father; Coronary artery disease in his brother and mother; Diabetes in his mother; Diabetes type II in his mother; and Hypertension in his mother. No Known Allergies    Review of Systems  Constitutional: Negative for fatigue.  Eyes: Negative for visual disturbance.  Respiratory: Negative for cough, chest tightness and shortness of breath.   Cardiovascular: Negative for chest pain, palpitations and leg swelling.  Neurological: Negative for dizziness, syncope, weakness, light-headedness and headaches.       Objective:   Physical Exam  Constitutional: He appears well-developed and well-nourished.  Neck: Neck supple. No thyromegaly present.  Cardiovascular: Normal  rate and regular rhythm.   Pulmonary/Chest: Effort normal and breath sounds normal. No respiratory distress. He has no wheezes. He has no rales.  Musculoskeletal: He exhibits no edema.  Lymphadenopathy:    He has no cervical adenopathy.          Assessment & Plan:  Type 2 diabetes. Improved control since starting back metformin. Followup in 3 months and repeat A1c and lipids then. Patient requesting to get back with cardiologist after next followup visit. Sooner if he has any chest pain or other symptoms Prevention-pt received shingles vaccine

## 2011-12-05 ENCOUNTER — Other Ambulatory Visit: Payer: Self-pay | Admitting: *Deleted

## 2011-12-05 MED ORDER — METFORMIN HCL 500 MG PO TABS: 500.0000 mg | ORAL_TABLET | Freq: Two times a day (BID) | ORAL | Status: DC

## 2011-12-17 ENCOUNTER — Ambulatory Visit (INDEPENDENT_AMBULATORY_CARE_PROVIDER_SITE_OTHER): Payer: BC Managed Care – PPO | Admitting: Family Medicine

## 2011-12-17 ENCOUNTER — Encounter: Payer: Self-pay | Admitting: Family Medicine

## 2011-12-17 VITALS — BP 112/68 | Temp 98.1°F | Wt 256.0 lb

## 2011-12-17 DIAGNOSIS — E119 Type 2 diabetes mellitus without complications: Secondary | ICD-10-CM

## 2011-12-17 DIAGNOSIS — I2510 Atherosclerotic heart disease of native coronary artery without angina pectoris: Secondary | ICD-10-CM

## 2011-12-17 DIAGNOSIS — E785 Hyperlipidemia, unspecified: Secondary | ICD-10-CM

## 2011-12-17 NOTE — Progress Notes (Signed)
  Subjective:    Patient ID: [name], male    DOB: April 14, 1941, 71 y.o.   MRN: [id number]  HPI  Patient seen for medical followup. He presented several months ago with extremely elevated blood sugar and A1c 15%. Started metformin 500 mg twice daily and patient started regular exercise program. Has lost some additional weight. His blood sugars have averaged 98 over the past 90 days. He is symptomatically improved. Recent eye exam no retinopathy. He presented with some initial neuropathy type symptoms and these have resolved as blood sugars have improved. Recent dyslipidemia with elevated triglycerides which are likely improved with improvement in blood sugar control.  Patient has prior history of bypass. He has lost touch with cardiologist and needs to be referred back. He denies any recent chest pains. Tolerating exercise without difficulty. Blood pressure well controlled. No orthostasis. Nonsmoker  Past Medical History  Diagnosis Date  . HYPOTHYROIDISM 05/08/2010  . DM 05/08/2010  . HYPERLIPIDEMIA 05/08/2010  . HYPERTENSION, BENIGN 04/13/2008  . CAD, NATIVE VESSEL 04/13/2008   Past Surgical History  Procedure Date  . Coronary artery bypass graft 2002    reports that he quit smoking about 12 years ago. His smoking use included Cigarettes. He has a 30 pack-year smoking history. He quit smokeless tobacco use about 11 years ago. He reports that he drinks alcohol. His drug history not on file. family history includes Alcohol abuse in his father; Cancer in his father; Coronary artery disease in his brother and mother; Diabetes in his mother; Diabetes type II in his mother; and Hypertension in his mother. No Known Allergies    Review of Systems  Constitutional: Negative for fatigue.  Eyes: Negative for visual disturbance.  Respiratory: Negative for cough, chest tightness and shortness of breath.   Cardiovascular: Negative for chest pain, palpitations and leg swelling.  Neurological:  Negative for dizziness, syncope, weakness, light-headedness and headaches.       Objective:   Physical Exam  Constitutional: He is oriented to person, place, and time. He appears well-developed and well-nourished.  Neck: Neck supple. No thyromegaly present.  Cardiovascular: Normal rate and regular rhythm.   Pulmonary/Chest: Effort normal and breath sounds normal. No respiratory distress. He has no wheezes. He has no rales.  Musculoskeletal: He exhibits no edema.       Feet reveal no skin lesions. Good distal foot pulses. Good capillary refill. No calluses. Normal sensation with monofilament testing   Neurological: He is alert and oriented to person, place, and time.          Assessment & Plan:  #1 type 2 diabetes. Greatly improved by home readings. Recheck A1c  #2 history of CAD. Needs referral back to cardiologist and we will set up  #3 dyslipidemia. Repeat lipids at physical next year. Continue statin therapy

## 2011-12-18 NOTE — Progress Notes (Signed)
Quick Note:  Pt informed ______ 

## 2012-02-07 ENCOUNTER — Ambulatory Visit (INDEPENDENT_AMBULATORY_CARE_PROVIDER_SITE_OTHER): Payer: BC Managed Care – PPO | Admitting: Cardiology

## 2012-02-07 ENCOUNTER — Encounter: Payer: Self-pay | Admitting: Cardiology

## 2012-02-07 VITALS — BP 130/74 | HR 49 | Ht 71.75 in | Wt 253.0 lb

## 2012-02-07 DIAGNOSIS — I359 Nonrheumatic aortic valve disorder, unspecified: Secondary | ICD-10-CM

## 2012-02-07 DIAGNOSIS — I10 Essential (primary) hypertension: Secondary | ICD-10-CM

## 2012-02-07 DIAGNOSIS — E785 Hyperlipidemia, unspecified: Secondary | ICD-10-CM

## 2012-02-07 DIAGNOSIS — I350 Nonrheumatic aortic (valve) stenosis: Secondary | ICD-10-CM

## 2012-02-07 DIAGNOSIS — I2510 Atherosclerotic heart disease of native coronary artery without angina pectoris: Secondary | ICD-10-CM

## 2012-02-07 NOTE — Assessment & Plan Note (Signed)
Blood pressure is controlled. Continue present medications. He is mildly bradycardic but is asymptomatic. We will consider decreasing his Toprol in the future if needed.

## 2012-02-07 NOTE — Assessment & Plan Note (Signed)
Plan schedule followup echocardiogram as his aortic stenosis sounds to have progressed on examination.

## 2012-02-07 NOTE — Progress Notes (Signed)
HPI: pleasant male for management of coronary artery disease. The patient is status post coronary artery bypassing graft. Last Myoview in September of 2008 showed an ejection fraction of 51% and no ischemia or infarction. Echocardiogram in September of 2008 showed normal LV function and mild aortic stenosis with a mean gradient of 10 mm of mercury. There was trace aortic insufficiency. There was mild left atrial enlargement. The patient has not been seen since September of 2009. He denies dyspnea, chest pain, palpitations or syncope.  Current Outpatient Prescriptions  Medication Sig Dispense Refill  . amLODipine-benazepril (LOTREL) 5-20 MG per capsule Take 1 capsule by mouth daily.  90 capsule  3  . aspirin 325 MG tablet Take 325 mg by mouth daily.        . betamethasone dipropionate 0.05 % lotion Apply topically as needed.        . Blood Glucose Monitoring Suppl (ONE TOUCH BASIC SYSTEM) W/DEVICE KIT by Does not apply route. Dispensed 08/30/11      . ezetimibe-simvastatin (VYTORIN) 10-40 MG per tablet Take 1 tablet by mouth daily.  90 tablet  3  . glucose blood test strip Test daily as directed prn  100 each  3  . levothyroxine (SYNTHROID, LEVOTHROID) 50 MCG tablet Take 1 tablet (50 mcg total) by mouth daily.  90 tablet  3  . metFORMIN (GLUCOPHAGE) 500 MG tablet Take 1 tablet (500 mg total) by mouth 2 (two) times daily with a meal.  180 tablet  0  . metoprolol (LOPRESSOR) 50 MG tablet Take 1 tablet (50 mg total) by mouth daily.  90 tablet  3  . Multiple Vitamin (MULTIVITAMIN) capsule Take 1 capsule by mouth daily.        . valsartan-hydrochlorothiazide (DIOVAN-HCT) 80-12.5 MG per tablet Take 1 tablet by mouth daily.  90 tablet  3  . levothyroxine (SYNTHROID, LEVOTHROID) 200 MCG tablet Take 1 tablet (200 mcg total) by mouth daily.  90 tablet  3   Current Facility-Administered Medications  Medication Dose Route Frequency Provider Last Rate Last Dose  . zoster vaccine live (PF) (ZOSTAVAX) injection  19,400 Units  0.65 mL Subcutaneous Once Kristian Covey, MD        No Known Allergies  Past Medical History  Diagnosis Date  . HYPOTHYROIDISM 05/08/2010  . DM 05/08/2010  . HYPERLIPIDEMIA 05/08/2010  . HYPERTENSION, BENIGN 04/13/2008  . CAD, NATIVE VESSEL 04/13/2008  . Aortic stenosis     Past Surgical History  Procedure Date  . Coronary artery bypass graft 2002    History   Social History  . Marital Status: Married    Spouse Name: N/A    Number of Children: N/A  . Years of Education: N/A   Occupational History  . Not on file.   Social History Main Topics  . Smoking status: Former Smoker -- 1.0 packs/day for 30 years    Types: Cigarettes    Quit date: 07/30/1999  . Smokeless tobacco: Former Neurosurgeon    Quit date: 07/18/2000  . Alcohol Use: Yes  . Drug Use: Not on file  . Sexually Active: Not on file     regular exercise    Other Topics Concern  . Not on file   Social History Narrative  . No narrative on file    Family History  Problem Relation Age of Onset  . Coronary artery disease Mother   . Diabetes type II Mother     diabetes type ll  . Hypertension Mother   . Diabetes  Mother     type ll  . Coronary artery disease Brother   . Alcohol abuse Father   . Cancer Father     colon    ROS: no fevers or chills, productive cough, hemoptysis, dysphasia, odynophagia, melena, hematochezia, dysuria, hematuria, rash, seizure activity, orthopnea, PND, pedal edema, claudication. Remaining systems are negative.  Physical Exam:   Blood pressure 130/74, pulse 49, height 5' 11.75" (1.822 m), weight 114.76 kg (253 lb).  General:  Well developed/well nourished in NAD Skin warm/dry Patient not depressed No peripheral clubbing Back-normal HEENT-normal/normal eyelids Neck supple/normal carotid upstroke bilaterally; no JVD; no thyromegaly chest - CTA/ normal expansion, previous sternotomy CV - RRR/normal S1; no rubs or gallops;  PMI nondisplaced, 2/6 systolic murmur  left sternal border radiating to the carotids. S2 is mildly diminished. Abdomen -NT/ND, no HSM, no mass, + bowel sounds, no bruit 2+ femoral pulses, no bruits Ext-no edema, chords, 2+ DP Neuro-grossly nonfocal  ECG sinus bradycardia at a rate of 49. Inferior lateral T-wave inversion.

## 2012-02-07 NOTE — Assessment & Plan Note (Signed)
Continue statin. Lipids and liver monitored by primary care. 

## 2012-02-07 NOTE — Assessment & Plan Note (Signed)
Plan continue aspirin and statin. Arrange myoview for risk stratification.

## 2012-02-07 NOTE — Patient Instructions (Addendum)
Your physician wants you to follow-up in: ONE YEAR WITH DR CRENSHAW You will receive a reminder letter in the mail two months in advance. If you don't receive a letter, please call our office to schedule the follow-up appointment.   Your physician has requested that you have en exercise stress myoview. For further information please visit [email or web address]. Please follow instruction sheet, as given.   Your physician has requested that you have an echocardiogram. Echocardiography is a painless test that uses sound waves to create images of your heart. It provides your doctor with information about the size and shape of your heart and how well your heart's chambers and valves are working. This procedure takes approximately one hour. There are no restrictions for this procedure.   

## 2012-02-12 ENCOUNTER — Ambulatory Visit (HOSPITAL_COMMUNITY): Payer: BC Managed Care – PPO | Attending: Cardiology | Admitting: Radiology

## 2012-02-12 VITALS — BP 130/53 | Ht 71.5 in | Wt 251.0 lb

## 2012-02-12 DIAGNOSIS — R9431 Abnormal electrocardiogram [ECG] [EKG]: Secondary | ICD-10-CM

## 2012-02-12 DIAGNOSIS — I10 Essential (primary) hypertension: Secondary | ICD-10-CM | POA: Insufficient documentation

## 2012-02-12 DIAGNOSIS — E119 Type 2 diabetes mellitus without complications: Secondary | ICD-10-CM | POA: Insufficient documentation

## 2012-02-12 DIAGNOSIS — E785 Hyperlipidemia, unspecified: Secondary | ICD-10-CM | POA: Insufficient documentation

## 2012-02-12 DIAGNOSIS — Z8249 Family history of ischemic heart disease and other diseases of the circulatory system: Secondary | ICD-10-CM | POA: Insufficient documentation

## 2012-02-12 DIAGNOSIS — Z951 Presence of aortocoronary bypass graft: Secondary | ICD-10-CM | POA: Insufficient documentation

## 2012-02-12 DIAGNOSIS — Z87891 Personal history of nicotine dependence: Secondary | ICD-10-CM | POA: Insufficient documentation

## 2012-02-12 DIAGNOSIS — I2510 Atherosclerotic heart disease of native coronary artery without angina pectoris: Secondary | ICD-10-CM

## 2012-02-12 DIAGNOSIS — I359 Nonrheumatic aortic valve disorder, unspecified: Secondary | ICD-10-CM | POA: Insufficient documentation

## 2012-02-12 MED ORDER — TECHNETIUM TC 99M TETROFOSMIN IV KIT
33.0000 | PACK | Freq: Once | INTRAVENOUS | Status: AC | PRN
  Administered 2012-02-12: 33 via INTRAVENOUS

## 2012-02-12 MED ORDER — TECHNETIUM TC 99M TETROFOSMIN IV KIT
11.0000 | PACK | Freq: Once | INTRAVENOUS | Status: AC | PRN
  Administered 2012-02-12: 11 via INTRAVENOUS

## 2012-02-12 NOTE — Progress Notes (Signed)
[facility] SITE 3 NUCLEAR MED [address] [place] [address] [phone]  Cardiology Nuclear Med Study  [name] is a 71 y.o. male     MRN : [id number]     DOB: 1941/06/10  Procedure Date: 02/12/2012  Nuclear Med Background Indication for Stress Test:  Evaluation for Ischemia and Graft Patency History:  2002 Heart Cath:3V Dz CABG x4,  03/2007 MPS: (-) ischemia EF: 51% - ECHO: EF: 60% mild AS Cardiac Risk Factors: Family History - CAD, History of Smoking, Hypertension, Lipids and NIDDM  Symptoms:  n/a   Nuclear Pre-Procedure Caffeine/Decaff Intake:  None > 12 hrs NPO After: 6:30pm   Lungs:  clear O2 Sat: 95% on room air. IV 0.9% NS with Angio Cath:  20g  IV Site: R Antecubital x 1, tolerated well IV Started by:  Irean Hong, RN  Chest Size (in):  46 Cup Size: n/a  Height: 5' 11.5" (1.816 m)  Weight:  251 lb (113.853 kg)  BMI:  Body mass index is 34.52 kg/(m^2). Tech Comments:  Held lopressor x 24 hrs    Nuclear Med Study 1 or 2 day study: 1 day  Stress Test Type:  Stress  Reading MD: Cassell Clement, MD  Order Authorizing Provider:  Olga Millers, MD  Resting Radionuclide: Technetium 74m Tetrofosmin  Resting Radionuclide Dose: 11.0 mCi   Stress Radionuclide:  Technetium 52m Tetrofosmin  Stress Radionuclide Dose: 32.8 mCi           Stress Protocol Rest HR: 55 Stress HR: 155  Rest BP: 130/53 Stress BP: 200/60  Exercise Time (min): 7:00 METS: 8.50   Predicted Max HR: 152 bpm % Max HR: 101.97 bpm Rate Pressure Product: 21308   Dose of Adenosine (mg):  n/a Dose of Lexiscan: n/a mg  Dose of Atropine (mg): n/a Dose of Dobutamine: n/a mcg/kg/min (at max HR)  Stress Test Technologist: Milana Na, EMT-P  Nuclear Technologist:  Doyne Keel, CNMT     Rest Procedure:  Myocardial perfusion imaging was performed at rest 45 minutes following the intravenous administration of Technetium 55m Tetrofosmin. Rest ECG: NSR with non-specific ST-T  wave changes  Stress Procedure:  The patient performed treadmill exercise using a Bruce  Protocol for 7:00 minutes. The patient stopped due to fatigue and denied any chest pain.  There were non specific ST-T wave changes, and a rare pvc.  Technetium 67m Tetrofosmin was injected at peak exercise and myocardial perfusion imaging was performed after a brief delay. Stress ECG: No significant change from baseline ECG  QPS Raw Data Images:  Normal; no motion artifact; normal heart/lung ratio. Stress Images:  Normal homogeneous uptake in all areas of the myocardium. Rest Images:  Normal homogeneous uptake in all areas of the myocardium. Subtraction (SDS):  No evidence of ischemia. Transient Ischemic Dilatation (Normal <1.22):  1.04 Lung/Heart Ratio (Normal <0.45):  0.32  Quantitative Gated Spect Images QGS EDV:  131 ml QGS ESV:  57 ml  Impression Exercise Capacity:  Good exercise capacity. BP Response:  Hypertensive blood pressure response. Clinical Symptoms:  No chest pain. ECG Impression:  Baseline ST depression in inferolateral leads increases during stress. Comparison with Prior Nuclear Study: No images to compare  Overall Impression:  Low risk stress nuclear study. No evidence of ischemia or scar. Hypertensive response to exercise. Resting STT changes increase during exercise.  LV Ejection Fraction: 56%.  LV Wall Motion:  NL LV Function; NL Wall Motion  [employer]

## 2012-02-13 ENCOUNTER — Ambulatory Visit (HOSPITAL_COMMUNITY): Payer: BC Managed Care – PPO | Attending: Cardiovascular Disease | Admitting: Radiology

## 2012-02-13 DIAGNOSIS — E119 Type 2 diabetes mellitus without complications: Secondary | ICD-10-CM | POA: Insufficient documentation

## 2012-02-13 DIAGNOSIS — I517 Cardiomegaly: Secondary | ICD-10-CM | POA: Insufficient documentation

## 2012-02-13 DIAGNOSIS — Z87891 Personal history of nicotine dependence: Secondary | ICD-10-CM | POA: Insufficient documentation

## 2012-02-13 DIAGNOSIS — E669 Obesity, unspecified: Secondary | ICD-10-CM | POA: Insufficient documentation

## 2012-02-13 DIAGNOSIS — I359 Nonrheumatic aortic valve disorder, unspecified: Secondary | ICD-10-CM | POA: Insufficient documentation

## 2012-02-13 DIAGNOSIS — E785 Hyperlipidemia, unspecified: Secondary | ICD-10-CM | POA: Insufficient documentation

## 2012-02-13 DIAGNOSIS — I10 Essential (primary) hypertension: Secondary | ICD-10-CM | POA: Insufficient documentation

## 2012-02-13 DIAGNOSIS — I2510 Atherosclerotic heart disease of native coronary artery without angina pectoris: Secondary | ICD-10-CM | POA: Insufficient documentation

## 2012-02-13 NOTE — Progress Notes (Signed)
Echocardiogram performed.  

## 2012-03-18 ENCOUNTER — Encounter: Payer: Self-pay | Admitting: Family Medicine

## 2012-03-18 ENCOUNTER — Ambulatory Visit (INDEPENDENT_AMBULATORY_CARE_PROVIDER_SITE_OTHER): Payer: BC Managed Care – PPO | Admitting: Family Medicine

## 2012-03-18 VITALS — BP 140/64 | Temp 98.0°F | Wt 258.0 lb

## 2012-03-18 DIAGNOSIS — E119 Type 2 diabetes mellitus without complications: Secondary | ICD-10-CM

## 2012-03-18 DIAGNOSIS — E785 Hyperlipidemia, unspecified: Secondary | ICD-10-CM

## 2012-03-18 DIAGNOSIS — E039 Hypothyroidism, unspecified: Secondary | ICD-10-CM

## 2012-03-18 DIAGNOSIS — I10 Essential (primary) hypertension: Secondary | ICD-10-CM

## 2012-03-18 NOTE — Progress Notes (Signed)
  Subjective:    Patient ID: [name], male    DOB: 1941/06/18, 71 y.o.   MRN: [id number]  HPI  Medical followup. Patient has type 2 diabetes, history of CAD, hypertension, hyperlipidemia, and hypothyroidism. Last visit, his blood sugars had improved tremendously with A1c 15.5 down to 6.4. Patient has made dietary changes and is walking regularly. He is on metformin 500 mg twice a day. His 90 day average for blood sugars over the past 90 days is 100. No symptoms of hyperglycemia.  Blood pressure medications reviewed. No orthostasis. No recent chest pains. Thyroid and lipids were checked last February. His triglycerides were elevated and we explained this is likely exacerbated by his diabetes. Will plan to get followup labs including lipid panel and TSH in February at his physical.  His former employer offers free flu vaccine and he plans to get through them. Patient getting regular eye exams every 6 months. He had some brief neuropathy type symptoms and feet initially with diagnosis but since improved blood sugar control these have entirely resolved  Past Medical History  Diagnosis Date  . HYPOTHYROIDISM 05/08/2010  . DM 05/08/2010  . HYPERLIPIDEMIA 05/08/2010  . HYPERTENSION, BENIGN 04/13/2008  . CAD, NATIVE VESSEL 04/13/2008  . Aortic stenosis    Past Surgical History  Procedure Date  . Coronary artery bypass graft 2002    reports that he quit smoking about 12 years ago. His smoking use included Cigarettes. He has a 30 pack-year smoking history. He quit smokeless tobacco use about 11 years ago. He reports that he drinks alcohol. His drug history not on file. family history includes Alcohol abuse in his father; Cancer in his father; Coronary artery disease in his brother and mother; Diabetes in his mother; Diabetes type II in his mother; and Hypertension in his mother. No Known Allergies   Review of Systems  Constitutional: Negative for appetite change, fatigue and unexpected  weight change.  Eyes: Negative for visual disturbance.  Respiratory: Negative for cough, chest tightness and shortness of breath.   Cardiovascular: Negative for chest pain, palpitations and leg swelling.  Neurological: Negative for dizziness, syncope, weakness, light-headedness and headaches.       Objective:   Physical Exam  Constitutional: He appears well-developed and well-nourished. No distress.  Neck: Neck supple. No thyromegaly present.  Cardiovascular: Normal rate and regular rhythm.   Pulmonary/Chest: Effort normal and breath sounds normal. No respiratory distress. He has no wheezes. He has no rales.  Musculoskeletal: He exhibits no edema.       Feet reveal no skin lesions. Good distal foot pulses. Good capillary refill. No calluses. Normal sensation with monofilament testing   Lymphadenopathy:    He has no cervical adenopathy.          Assessment & Plan:  #1 type 2 diabetes. Improved control. Recheck A1c. If good today good every 6 months. Continue regular eye exams. #2 hypertension stable. Continue exercise and weight loss efforts  #3 history of hypothyroidism. Recheck TSH at followup in February.

## 2012-03-19 NOTE — Progress Notes (Signed)
Quick Note:  Pt informed ______ 

## 2012-03-26 ENCOUNTER — Other Ambulatory Visit: Payer: Self-pay | Admitting: Family Medicine

## 2012-09-08 ENCOUNTER — Other Ambulatory Visit (INDEPENDENT_AMBULATORY_CARE_PROVIDER_SITE_OTHER): Payer: BC Managed Care – PPO

## 2012-09-08 DIAGNOSIS — Z0000 Encounter for general adult medical examination without abnormal findings: Secondary | ICD-10-CM

## 2012-09-08 LAB — CBC WITH DIFFERENTIAL/PLATELET
Basophils Relative: 0.4 % (ref 0.0–3.0)
Eosinophils Absolute: 0.2 10*3/uL (ref 0.0–0.7)
Eosinophils Relative: 2.1 % (ref 0.0–5.0)
HCT: 42.1 % (ref 39.0–52.0)
Hemoglobin: 14 g/dL (ref 13.0–17.0)
Lymphs Abs: 1.4 10*3/uL (ref 0.7–4.0)
MCHC: 33.3 g/dL (ref 30.0–36.0)
MCV: 90.8 fl (ref 78.0–100.0)
Monocytes Absolute: 0.8 10*3/uL (ref 0.1–1.0)
Neutro Abs: 6 10*3/uL (ref 1.4–7.7)
Neutrophils Relative %: 71.5 % (ref 43.0–77.0)
RBC: 4.64 Mil/uL (ref 4.22–5.81)
WBC: 8.4 10*3/uL (ref 4.5–10.5)

## 2012-09-08 LAB — POCT URINALYSIS DIPSTICK
Bilirubin, UA: NEGATIVE
Blood, UA: NEGATIVE
Ketones, UA: NEGATIVE
Nitrite, UA: NEGATIVE
Protein, UA: NEGATIVE
pH, UA: 5.5

## 2012-09-08 LAB — MICROALBUMIN / CREATININE URINE RATIO
Creatinine,U: 49.1 mg/dL
Microalb, Ur: 2.1 mg/dL — ABNORMAL HIGH (ref 0.0–1.9)

## 2012-09-08 LAB — HEPATIC FUNCTION PANEL
ALT: 20 U/L (ref 0–53)
Albumin: 4 g/dL (ref 3.5–5.2)
Bilirubin, Direct: 0.1 mg/dL (ref 0.0–0.3)
Total Protein: 7.1 g/dL (ref 6.0–8.3)

## 2012-09-08 LAB — LIPID PANEL
HDL: 30.2 mg/dL — ABNORMAL LOW (ref >39.00)
Total CHOL/HDL Ratio: 4
VLDL: 30.8 mg/dL (ref 0.0–40.0)

## 2012-09-08 LAB — BASIC METABOLIC PANEL
BUN: 27 mg/dL — ABNORMAL HIGH (ref 6–23)
Creatinine, Ser: 1.3 mg/dL (ref 0.4–1.5)
GFR: 60.78 mL/min (ref >60.00)
Glucose, Bld: 99 mg/dL (ref 70–99)
Potassium: 4.3 mEq/L (ref 3.5–5.1)

## 2012-09-08 LAB — PSA: PSA: 1.4 ng/mL (ref 0.10–4.00)

## 2012-09-15 ENCOUNTER — Ambulatory Visit (INDEPENDENT_AMBULATORY_CARE_PROVIDER_SITE_OTHER): Payer: BC Managed Care – PPO | Admitting: Family Medicine

## 2012-09-15 ENCOUNTER — Encounter: Payer: Self-pay | Admitting: Family Medicine

## 2012-09-15 VITALS — BP 130/70 | HR 72 | Temp 98.0°F | Resp 12 | Ht 71.5 in | Wt 263.0 lb

## 2012-09-15 DIAGNOSIS — E785 Hyperlipidemia, unspecified: Secondary | ICD-10-CM

## 2012-09-15 DIAGNOSIS — E119 Type 2 diabetes mellitus without complications: Secondary | ICD-10-CM

## 2012-09-15 DIAGNOSIS — Z0000 Encounter for general adult medical examination without abnormal findings: Secondary | ICD-10-CM

## 2012-09-15 DIAGNOSIS — E039 Hypothyroidism, unspecified: Secondary | ICD-10-CM

## 2012-09-15 DIAGNOSIS — I10 Essential (primary) hypertension: Secondary | ICD-10-CM

## 2012-09-15 MED ORDER — METFORMIN HCL 500 MG PO TABS: 500.0000 mg | ORAL_TABLET | Freq: Two times a day (BID) | ORAL | Status: DC

## 2012-09-15 MED ORDER — BETAMETHASONE DIPROPIONATE 0.05 % EX LOTN: TOPICAL_LOTION | CUTANEOUS | Status: DC | PRN

## 2012-09-15 MED ORDER — METOPROLOL TARTRATE 50 MG PO TABS: 50.0000 mg | ORAL_TABLET | Freq: Every day | ORAL | Status: DC

## 2012-09-15 MED ORDER — LEVOTHYROXINE SODIUM 200 MCG PO TABS: 200.0000 ug | ORAL_TABLET | Freq: Every day | ORAL | Status: DC

## 2012-09-15 MED ORDER — EZETIMIBE-SIMVASTATIN 10-40 MG PO TABS: 1.0000 | ORAL_TABLET | Freq: Every day | ORAL | Status: DC

## 2012-09-15 MED ORDER — AMLODIPINE BESY-BENAZEPRIL HCL 5-20 MG PO CAPS: 1.0000 | ORAL_CAPSULE | Freq: Every day | ORAL | Status: DC

## 2012-09-15 MED ORDER — VALSARTAN-HYDROCHLOROTHIAZIDE 80-12.5 MG PO TABS: 1.0000 | ORAL_TABLET | Freq: Every day | ORAL | Status: DC

## 2012-09-15 NOTE — Patient Instructions (Addendum)
Continue to work on weight loss Continue monitoring of blood sugars Let's plan to repeat thyroid functions at followup in 6 months

## 2012-09-15 NOTE — Progress Notes (Signed)
Subjective:    Patient ID: [name], male    DOB: [birth year], 72 y.o.   MRN: [id number]  HPI Complete physical and medical followup  Past medical history significant for hypothyroidism, type 2 diabetes, hyperlipidemia, hypertension, and CAD. Bypass several years ago. No chest pains. Blood sugars been very stable. Recent eye exam. History of cataracts. No diabetic retinopathy. Recent A1c 6.1%. Compliant with all medications.  Immunizations up-to-date. Last colonoscopy 2009. Needs repeat this year. Father had colon cancer history. Patient watching diet closely. No consistent exercise.  Past Medical History  Diagnosis Date  . HYPOTHYROIDISM 05/08/2010  . DM 05/08/2010  . HYPERLIPIDEMIA 05/08/2010  . HYPERTENSION, BENIGN 04/13/2008  . CAD, NATIVE VESSEL 04/13/2008  . Aortic stenosis    Past Surgical History  Procedure Laterality Date  . Coronary artery bypass graft  2002    reports that he quit smoking about 13 years ago. His smoking use included Cigarettes. He has a 30 pack-year smoking history. He quit smokeless tobacco use about 12 years ago. He reports that  drinks alcohol. His drug history is not on file. family history includes Alcohol abuse in his father; Cancer in his father; Coronary artery disease in his brother and mother; Diabetes in his mother; Diabetes type II in his mother; and Hypertension in his mother. No Known Allergies    Review of Systems  Constitutional: Negative for fever, activity change, appetite change and fatigue.  HENT: Negative for ear pain, congestion, trouble swallowing and voice change.   Eyes: Negative for pain and visual disturbance.  Respiratory: Negative for cough, shortness of breath and wheezing.   Cardiovascular: Negative for chest pain and palpitations.  Gastrointestinal: Negative for nausea, vomiting, abdominal pain, diarrhea, constipation, blood in stool, abdominal distention and rectal pain.  Genitourinary: Negative for dysuria,  hematuria and testicular pain.  Musculoskeletal: Negative for joint swelling and arthralgias.  Skin: Negative for rash.  Neurological: Negative for dizziness, syncope and headaches.  Hematological: Negative for adenopathy.  Psychiatric/Behavioral: Negative for confusion and dysphoric mood.       Objective:   Physical Exam  Constitutional: He is oriented to person, place, and time. He appears well-developed and well-nourished. No distress.  HENT:  Head: Normocephalic and atraumatic.  Right Ear: External ear normal.  Left Ear: External ear normal.  Mouth/Throat: Oropharynx is clear and moist.  Eyes: Conjunctivae and EOM are normal. Pupils are equal, round, and reactive to light.  Neck: Normal range of motion. Neck supple. No thyromegaly present.  Cardiovascular: Normal rate and regular rhythm.   Pulmonary/Chest: No respiratory distress. He has no wheezes. He has no rales.  Abdominal: Soft. Bowel sounds are normal. He exhibits no distension and no mass. There is no tenderness. There is no rebound and no guarding.  Musculoskeletal: He exhibits no edema.  Lymphadenopathy:    He has no cervical adenopathy.  Neurological: He is alert and oriented to person, place, and time. He displays normal reflexes. No cranial nerve deficit.  Skin: No rash noted.  Psychiatric: He has a normal mood and affect.          Assessment & Plan:  Complete physical. Labs reviewed. Thyroid slightly over replaced. Reduce levothyroxin to total of 225 mcg daily. Recheck TSH within 6 months. Diabetes well controlled. Continue metformin. Work on weight loss Refilled all chronic meds for one year.   Hypothyroidism.  Adjusted thyroid as above.  Repeat within 6 months. Type 2 diabetes   Hx of good control. Hyperlipidemia.  Refill Vytorin for one  year.  We discussed issues of possible interaction with Simvastatin and Amlodipine and he has been on this combination for years per cardiology and has no side effects (no  myalgias). We have elected to continue current regimen.   Hypertension. Well controlled.  Refilled medications.

## 2012-10-08 ENCOUNTER — Telehealth: Payer: Self-pay | Admitting: Family Medicine

## 2012-10-08 MED ORDER — LEVOTHYROXINE SODIUM 25 MCG PO TABS: 25.0000 ug | ORAL_TABLET | Freq: Every day | ORAL | Status: DC

## 2012-10-08 NOTE — Telephone Encounter (Signed)
Pt needs new rx levothyroxine 25 mcg #90 with refills call into [facility]

## 2012-11-14 ENCOUNTER — Other Ambulatory Visit: Payer: Self-pay | Admitting: Family Medicine

## 2012-12-16 ENCOUNTER — Encounter: Payer: Self-pay | Admitting: Family Medicine

## 2012-12-25 ENCOUNTER — Encounter: Payer: Self-pay | Admitting: *Deleted

## 2012-12-31 ENCOUNTER — Encounter: Payer: Self-pay | Admitting: Gastroenterology

## 2013-01-12 ENCOUNTER — Encounter: Payer: Self-pay | Admitting: Gastroenterology

## 2013-02-13 ENCOUNTER — Other Ambulatory Visit: Payer: Self-pay | Admitting: Family Medicine

## 2013-03-06 ENCOUNTER — Ambulatory Visit (INDEPENDENT_AMBULATORY_CARE_PROVIDER_SITE_OTHER): Payer: BC Managed Care – PPO | Admitting: Cardiology

## 2013-03-06 ENCOUNTER — Encounter: Payer: Self-pay | Admitting: Cardiology

## 2013-03-06 VITALS — BP 145/62 | HR 52 | Ht 71.5 in | Wt 257.8 lb

## 2013-03-06 DIAGNOSIS — R0989 Other specified symptoms and signs involving the circulatory and respiratory systems: Secondary | ICD-10-CM

## 2013-03-06 DIAGNOSIS — I2510 Atherosclerotic heart disease of native coronary artery without angina pectoris: Secondary | ICD-10-CM

## 2013-03-06 NOTE — Progress Notes (Signed)
HPI: Pleasant male for fu of coronary artery disease. The patient is status post coronary artery bypassing graft. Last Myoview in August of 2013 showed an ejection fraction of 56%. There was no scar or ischemia. Echocardiogram in August of 2013 showed normal LV function. There was mild to moderate aortic stenosis with a mean gradient of 20 mm mercury. There was mild left atrial enlargement. I last saw him in August of 2013. Since that time, he denies dyspnea, chest pain, palpitations or syncope.   Current Outpatient Prescriptions  Medication Sig Dispense Refill  . amLODipine-benazepril (LOTREL) 5-20 MG per capsule Take 1 capsule by mouth daily.  90 capsule  3  . aspirin 325 MG tablet Take 325 mg by mouth daily.        . betamethasone dipropionate 0.05 % lotion Apply topically as needed.  60 mL  1  . Blood Glucose Monitoring Suppl (ONE TOUCH BASIC SYSTEM) W/DEVICE KIT by Does not apply route. Dispensed 08/30/11      . ezetimibe-simvastatin (VYTORIN) 10-40 MG per tablet Take 1 tablet by mouth daily.  90 tablet  3  . levothyroxine (SYNTHROID, LEVOTHROID) 200 MCG tablet Take 1 tablet (200 mcg total) by mouth daily.  90 tablet  3  . levothyroxine (SYNTHROID, LEVOTHROID) 25 MCG tablet Take 1 tablet (25 mcg total) by mouth daily.  90 tablet  3  . metFORMIN (GLUCOPHAGE) 500 MG tablet Take 1 tablet (500 mg total) by mouth 2 (two) times daily with a meal.  180 tablet  3  . metoprolol (LOPRESSOR) 50 MG tablet Take 1 tablet (50 mg total) by mouth daily.  90 tablet  3  . Multiple Vitamin (MULTIVITAMIN) capsule Take 1 capsule by mouth daily.        Letta Pate VERIO test strip TEST DAILY AS NEEDED  100 each  3  . valsartan-hydrochlorothiazide (DIOVAN-HCT) 80-12.5 MG per tablet Take 1 tablet by mouth daily.  90 tablet  3   No current facility-administered medications for this visit.     Past Medical History  Diagnosis Date  . HYPOTHYROIDISM 05/08/2010  . DM 05/08/2010  . HYPERLIPIDEMIA 05/08/2010  .  HYPERTENSION, BENIGN 04/13/2008  . CAD, NATIVE VESSEL 04/13/2008  . Aortic stenosis     Past Surgical History  Procedure Laterality Date  . Coronary artery bypass graft  2002    History   Social History  . Marital Status: Married    Spouse Name: N/A    Number of Children: N/A  . Years of Education: N/A   Occupational History  . Not on file.   Social History Main Topics  . Smoking status: Former Smoker -- 1.00 packs/day for 30 years    Types: Cigarettes    Quit date: 07/30/1999  . Smokeless tobacco: Former Neurosurgeon    Quit date: 07/18/2000  . Alcohol Use: Yes  . Drug Use: Not on file  . Sexual Activity: Not on file     Comment: regular exercise    Other Topics Concern  . Not on file   Social History Narrative  . No narrative on file    ROS: no fevers or chills, productive cough, hemoptysis, dysphasia, odynophagia, melena, hematochezia, dysuria, hematuria, rash, seizure activity, orthopnea, PND, pedal edema, claudication. Remaining systems are negative.  Physical Exam: Well-developed well-nourished in no acute distress.  Skin is warm and dry.  HEENT is normal.  Neck is supple.  Chest is clear to auscultation with normal expansion.  Cardiovascular exam is regular rate and rhythm.  2/6 systolic murmur left sternal border. S2 is not diminished. Abdominal exam nontender or distended. No masses palpated. Positive bruit Extremities show no edema. neuro grossly intact  ECG sinus bradycardia at a rate of 52. Left ventricular hypertrophy with repolarization abnormality.

## 2013-03-06 NOTE — Assessment & Plan Note (Signed)
Blood pressure controlled.continue present medications. Potassium and renal function monitored by primary care.

## 2013-03-06 NOTE — Assessment & Plan Note (Signed)
Continue aspirin and statin. 

## 2013-03-06 NOTE — Assessment & Plan Note (Signed)
Schedule ultrasound to exclude aneurysm. 

## 2013-03-06 NOTE — Assessment & Plan Note (Signed)
On exam his aortic stenosis does not sound severe. Mild to moderate on most recent echocardiogram. Plan repeat study when he returns in one year.

## 2013-03-06 NOTE — Assessment & Plan Note (Signed)
Continue statin. Lipids and liver monitored by primary care. 

## 2013-03-06 NOTE — Patient Instructions (Addendum)
Your physician wants you to follow-up in: ONE YEAR WITH DR CRENSHAW You will receive a reminder letter in the mail two months in advance. If you don't receive a letter, please call our office to schedule the follow-up appointment.   Your physician has requested that you have an abdominal aorta duplex. During this test, an ultrasound is used to evaluate the aorta. Allow 30 minutes for this exam. Do not eat after midnight the day before and avoid carbonated beverages  

## 2013-03-16 ENCOUNTER — Encounter (INDEPENDENT_AMBULATORY_CARE_PROVIDER_SITE_OTHER): Payer: BC Managed Care – PPO

## 2013-03-16 DIAGNOSIS — R0989 Other specified symptoms and signs involving the circulatory and respiratory systems: Secondary | ICD-10-CM

## 2013-03-18 ENCOUNTER — Encounter: Payer: Self-pay | Admitting: Family Medicine

## 2013-03-18 ENCOUNTER — Ambulatory Visit (INDEPENDENT_AMBULATORY_CARE_PROVIDER_SITE_OTHER): Payer: BC Managed Care – PPO | Admitting: Family Medicine

## 2013-03-18 VITALS — BP 132/66 | HR 51 | Temp 97.6°F | Wt 263.0 lb

## 2013-03-18 DIAGNOSIS — E039 Hypothyroidism, unspecified: Secondary | ICD-10-CM

## 2013-03-18 DIAGNOSIS — E785 Hyperlipidemia, unspecified: Secondary | ICD-10-CM

## 2013-03-18 DIAGNOSIS — E119 Type 2 diabetes mellitus without complications: Secondary | ICD-10-CM

## 2013-03-18 DIAGNOSIS — I10 Essential (primary) hypertension: Secondary | ICD-10-CM

## 2013-03-18 LAB — HEMOGLOBIN A1C: Hgb A1c MFr Bld: 6 % (ref 4.6–6.5)

## 2013-03-18 LAB — TSH: TSH: 0.22 u[IU]/mL — ABNORMAL LOW (ref 0.35–5.50)

## 2013-03-18 NOTE — Progress Notes (Signed)
  Subjective:    Patient ID: [name], male    DOB: [birth year], 72 y.o.   MRN: [id number]  HPI Patient here for medical followup.  Type 2 diabetes. Last A1c 6.1%. Fasting blood sugars consistently run 110-120. No symptoms of hyperglycemia. Remains on metformin. Walking about 2 miles per day. Denies any recent chest pains.  He plans to see ophthalmologist next week.  Saw cardiologist recently. No change in medications. Hypertension has been well controlled. Lipids were well controlled at followup last March. Recent abdominal aortic aneurysm screen negative. He has history of heart murmur and plans for repeat ultrasound next year.  Hypothyroidism with slightly over- replaced recently with TSH 0.15. We adjusted medication and he needs followup at this time. He does not have any overt symptoms of hyperthyroidism.  Lab Results  Component Value Date   HGBA1C 6.1 09/08/2012     Past Medical History  Diagnosis Date  . HYPOTHYROIDISM 05/08/2010  . DM 05/08/2010  . HYPERLIPIDEMIA 05/08/2010  . HYPERTENSION, BENIGN 04/13/2008  . CAD, NATIVE VESSEL 04/13/2008  . Aortic stenosis    Past Surgical History  Procedure Laterality Date  . Coronary artery bypass graft  2002    reports that he quit smoking about 13 years ago. His smoking use included Cigarettes. He has a 30 pack-year smoking history. He quit smokeless tobacco use about 12 years ago. He reports that  drinks alcohol. His drug history is not on file. family history includes Alcohol abuse in his father; Colon cancer in his father; Coronary artery disease in his brother and mother; Diabetes in his mother; Diabetes type II in his mother; Hypertension in his mother. No Known Allergies   Review of Systems  Constitutional: Negative for appetite change, fatigue and unexpected weight change.  Eyes: Negative for visual disturbance.  Respiratory: Negative for cough, chest tightness and shortness of breath.   Cardiovascular: Negative for  chest pain, palpitations and leg swelling.  Endocrine: Negative for polydipsia and polyuria.  Genitourinary: Negative for dysuria.  Neurological: Negative for dizziness, syncope, weakness, light-headedness and headaches.       Objective:   Physical Exam  Constitutional: He is oriented to person, place, and time. He appears well-developed and well-nourished. No distress.  HENT:  Head: Normocephalic and atraumatic.  Eyes: Conjunctivae and EOM are normal. Pupils are equal, round, and reactive to light.  Neck: Normal range of motion. Neck supple. No thyromegaly present.  Cardiovascular: Normal rate and regular rhythm.   Murmur heard. Pulmonary/Chest: No respiratory distress. He has no wheezes. He has no rales.  Musculoskeletal: He exhibits no edema.  Lymphadenopathy:    He has no cervical adenopathy.  Neurological: He is alert and oriented to person, place, and time.  Skin: No rash noted.  Psychiatric: He has a normal mood and affect.          Assessment & Plan:  #1 type 2 diabetes. History of good control. Recheck A1c today. Continue regular eye checkups. #2 hypothyroidism. Recently over replaced. Recheck TSH today #3 hypertension. Adequate control. Continue current medications. #4 health maintenance. He plans to get flu vaccine through work. Pneumovax is up to date.

## 2013-03-26 ENCOUNTER — Ambulatory Visit (AMBULATORY_SURGERY_CENTER): Payer: BC Managed Care – PPO | Admitting: *Deleted

## 2013-03-26 VITALS — Ht 71.0 in | Wt 266.6 lb

## 2013-03-26 DIAGNOSIS — Z86010 Personal history of colonic polyps: Secondary | ICD-10-CM

## 2013-03-26 MED ORDER — MOVIPREP 100 G PO SOLR: ORAL | Status: DC

## 2013-03-26 NOTE — Progress Notes (Signed)
No allergies to eggs or soy. No problems with anesthesia.  Pt had poor prep colonoscopy 2009.  Pt given instructions for 2 day prep

## 2013-03-27 ENCOUNTER — Encounter: Payer: Self-pay | Admitting: Gastroenterology

## 2013-04-09 ENCOUNTER — Encounter: Payer: Self-pay | Admitting: Gastroenterology

## 2013-04-09 ENCOUNTER — Ambulatory Visit (AMBULATORY_SURGERY_CENTER): Payer: BC Managed Care – PPO | Admitting: Gastroenterology

## 2013-04-09 VITALS — BP 121/68 | HR 50 | Temp 97.0°F | Resp 18 | Ht 71.0 in | Wt 266.0 lb

## 2013-04-09 DIAGNOSIS — Z86010 Personal history of colonic polyps: Secondary | ICD-10-CM

## 2013-04-09 DIAGNOSIS — Z1211 Encounter for screening for malignant neoplasm of colon: Secondary | ICD-10-CM

## 2013-04-09 LAB — GLUCOSE, CAPILLARY: Glucose-Capillary: 106 mg/dL — ABNORMAL HIGH (ref 70–99)

## 2013-04-09 MED ORDER — SODIUM CHLORIDE 0.9 % IV SOLN: 500.0000 mL | INTRAVENOUS | Status: DC

## 2013-04-09 NOTE — Op Note (Signed)
[facility] [address] [place], [address]   COLONOSCOPY PROCEDURE REPORT  PATIENT: [name], [name]  MR#: [id number] BIRTHDATE: [birth year] , 72  yrs. old GENDER: Male ENDOSCOPIST: Mardella Layman, MD, [facility] REFERRED BY: PROCEDURE DATE:  04/09/2013 PROCEDURE:   Colonoscopy, screening First Screening Colonoscopy - Avg.  risk and is 50 yrs.  old or older - No.      History of Adenoma - Now for follow-up colonoscopy & has been > or = to 3 yrs.  N/A ASA CLASS:   Class III INDICATIONS:average risk screening. MEDICATIONS: propofol (Diprivan) 200mg  IV  DESCRIPTION OF PROCEDURE:   After the risks benefits and alternatives of the procedure were thoroughly explained, informed consent was obtained.  A digital rectal exam revealed no abnormalities of the rectum.   The LB [id number]  endoscope was introduced through the anus and advanced to the cecum, which was identified by both the appendix and ileocecal valve. No adverse events experienced.   The quality of the prep was adequate, using MoviPrep  The instrument was then slowly withdrawn as the colon was fully examined.      COLON FINDINGS: A normal appearing cecum, ileocecal valve, and appendiceal orifice were identified.  The ascending, hepatic flexure, transverse, splenic flexure, descending, sigmoid colon and rectum appeared unremarkable.  No polyps or cancers were seen. Small hyperplastic rectal nodules present and not biopsied. Retroflexed views revealed no abnormalities. The time to cecum=4 minutes 45 seconds.  Withdrawal time=6 minutes 37 seconds.  The scope was withdrawn and the procedure completed. COMPLICATIONS: There were no complications.  ENDOSCOPIC IMPRESSION: Normal colon .Marland Kitchen...no polyps noted...  RECOMMENDATIONS: 1.  Continue current medications 2.  Given your age, you will not need another colonoscopy for colon cancer screening or polyp surveillance.  These types of tests usually stop  around the age 64.   eSigned:  Mardella Layman, MD, [facility] 04/09/2013 9:16 AM   cc: Evelena Peat, MD

## 2013-04-09 NOTE — Progress Notes (Signed)
Patient did not experience any of the following events: a burn prior to discharge; a fall within the facility; wrong site/side/patient/procedure/implant event; or a hospital transfer or hospital admission upon discharge from the facility. (G8907) Patient did not have preoperative order for IV antibiotic SSI prophylaxis. (G8918)  

## 2013-04-09 NOTE — Patient Instructions (Addendum)

## 2013-04-10 ENCOUNTER — Telehealth: Payer: Self-pay

## 2013-04-10 NOTE — Telephone Encounter (Signed)
  Follow up Call-  Call back number 04/09/2013  Post procedure Call Back phone  # [phone]  Permission to leave phone message Yes     Patient questions:  Do you have a fever, pain , or abdominal swelling? no Pain Score  0 *  Have you tolerated food without any problems? yes  Have you been able to return to your normal activities? yes  Do you have any questions about your discharge instructions: Diet   no Medications  no Follow up visit  no  Do you have questions or concerns about your Care? no  Actions: * If pain score is 4 or above: No action needed, pain <4.

## 2013-07-14 ENCOUNTER — Ambulatory Visit: Payer: Self-pay | Admitting: Family Medicine

## 2013-09-15 ENCOUNTER — Ambulatory Visit: Payer: BC Managed Care – PPO | Admitting: Family Medicine

## 2013-09-28 LAB — HM DIABETES EYE EXAM

## 2013-10-08 ENCOUNTER — Other Ambulatory Visit: Payer: Self-pay | Admitting: Family Medicine

## 2013-10-27 ENCOUNTER — Ambulatory Visit (INDEPENDENT_AMBULATORY_CARE_PROVIDER_SITE_OTHER): Payer: BC Managed Care – PPO | Admitting: Family Medicine

## 2013-10-27 ENCOUNTER — Encounter: Payer: Self-pay | Admitting: Family Medicine

## 2013-10-27 VITALS — BP 136/70 | HR 69 | Temp 97.6°F | Wt 278.0 lb

## 2013-10-27 DIAGNOSIS — E785 Hyperlipidemia, unspecified: Secondary | ICD-10-CM

## 2013-10-27 DIAGNOSIS — E039 Hypothyroidism, unspecified: Secondary | ICD-10-CM

## 2013-10-27 DIAGNOSIS — E119 Type 2 diabetes mellitus without complications: Secondary | ICD-10-CM

## 2013-10-27 DIAGNOSIS — I10 Essential (primary) hypertension: Secondary | ICD-10-CM

## 2013-10-27 DIAGNOSIS — L409 Psoriasis, unspecified: Secondary | ICD-10-CM | POA: Insufficient documentation

## 2013-10-27 DIAGNOSIS — L408 Other psoriasis: Secondary | ICD-10-CM

## 2013-10-27 LAB — LIPID PANEL
Cholesterol: 120 mg/dL (ref 0–200)
HDL: 33.9 mg/dL — ABNORMAL LOW (ref >39.00)
LDL Cholesterol: 37 mg/dL (ref 0–99)
Total CHOL/HDL Ratio: 4
Triglycerides: 246 mg/dL — ABNORMAL HIGH (ref 0.0–149.0)
VLDL: 49.2 mg/dL — ABNORMAL HIGH (ref 0.0–40.0)

## 2013-10-27 LAB — HEPATIC FUNCTION PANEL
ALT: 23 U/L (ref 0–53)
AST: 26 U/L (ref 0–37)
Albumin: 4.2 g/dL (ref 3.5–5.2)
Alkaline Phosphatase: 44 U/L (ref 39–117)
BILIRUBIN DIRECT: 0 mg/dL (ref 0.0–0.3)
BILIRUBIN TOTAL: 0.8 mg/dL (ref 0.3–1.2)
Total Protein: 7.8 g/dL (ref 6.0–8.3)

## 2013-10-27 LAB — BASIC METABOLIC PANEL
BUN: 30 mg/dL — AB (ref 6–23)
CO2: 25 mEq/L (ref 19–32)
Calcium: 9.7 mg/dL (ref 8.4–10.5)
Chloride: 105 mEq/L (ref 96–112)
Creatinine, Ser: 1.4 mg/dL (ref 0.4–1.5)
GFR: 51.87 mL/min — AB (ref >60.00)
GLUCOSE: 107 mg/dL — AB (ref 70–99)
Potassium: 4.6 mEq/L (ref 3.5–5.1)
SODIUM: 139 meq/L (ref 135–145)

## 2013-10-27 LAB — HEMOGLOBIN A1C: Hgb A1c MFr Bld: 6.4 % (ref 4.6–6.5)

## 2013-10-27 LAB — TSH: TSH: 0.79 u[IU]/mL (ref 0.35–5.50)

## 2013-10-27 LAB — HM DIABETES FOOT EXAM: HM Diabetic Foot Exam: NORMAL

## 2013-10-27 MED ORDER — BETAMETHASONE DIPROPIONATE 0.05 % EX CREA: TOPICAL_CREAM | Freq: Two times a day (BID) | CUTANEOUS | Status: DC

## 2013-10-27 NOTE — Progress Notes (Signed)
Pre visit review using our clinic review tool, if applicable. No additional management support is needed unless otherwise documented below in the visit note. 

## 2013-10-27 NOTE — Progress Notes (Signed)
   Subjective:    Patient ID: [name], male    DOB: August 17, 1940, 73 y.o.   MRN: [id number]  HPI Patient seen for medical followup. His chronic problems include obesity, history of CAD with bypass back in 2001, type 2 diabetes, hyperlipidemia, hypertension, aortic stenosis, and hypothyroidism. He has unfortunately gained significant weight during the past several months. Less active. His weight is up 15 pounds. Fasting blood sugars mostly around 120-130. He remains on metformin and other medications which are all reviewed. Denies any recent chest pains.  Hypothyroidism and recently over replaced. We've been scaling back his medication. Needs followup labs for that.  History of psoriasis previously treated by dermatologist. He is requesting betamethasone cream. He currently has lotion. His psoriasis is been well controlled.  Past Medical History  Diagnosis Date  . HYPOTHYROIDISM 05/08/2010  . DM 05/08/2010  . HYPERLIPIDEMIA 05/08/2010  . HYPERTENSION, BENIGN 04/13/2008  . CAD, NATIVE VESSEL 04/13/2008  . Aortic stenosis    Past Surgical History  Procedure Laterality Date  . Coronary artery bypass graft  2002    reports that he quit smoking about 14 years ago. His smoking use included Cigarettes. He has a 30 pack-year smoking history. He quit smokeless tobacco use about 13 years ago. He reports that he drinks alcohol. He reports that he does not use illicit drugs. family history includes Alcohol abuse in his father; Colon cancer (age of onset: 13) in his father; Coronary artery disease in his brother and mother; Diabetes in his mother; Diabetes type II in his mother; Hypertension in his mother. No Known Allergies    Review of Systems  Constitutional: Negative for fatigue.  Eyes: Negative for visual disturbance.  Respiratory: Negative for cough, chest tightness and shortness of breath.   Cardiovascular: Negative for chest pain, palpitations and leg swelling.  Endocrine: Negative for  polydipsia and polyuria.  Neurological: Negative for dizziness, syncope, weakness, light-headedness and headaches.       Objective:   Physical Exam  Constitutional: He appears well-developed and well-nourished.  Neck: Neck supple. No thyromegaly present.  Cardiovascular: Normal rate.   Murmur heard. Pulmonary/Chest: Effort normal and breath sounds normal. No respiratory distress. He has no wheezes. He has no rales.  Musculoskeletal: He exhibits no edema.  Skin:  Feet reveal no skin lesions. Good distal foot pulses. Good capillary refill. No calluses. Normal sensation with monofilament testing           Assessment & Plan:  #1 type 2 diabetes. History of excellent control. Suspect worsening control recently with weight gain. Recheck A1c. Patient prefers lifestyle modification versus additional medication if A1c is up #2 hypertension which is stable. Continue current medications #3 history of psoriasis. Betamethasone cream prescribed for as needed use. #4 dyslipidemia. Check lipid and hepatic panel. #5 hypothyroidism. Recheck TSH

## 2013-10-28 ENCOUNTER — Telehealth: Payer: Self-pay | Admitting: Family Medicine

## 2013-10-28 NOTE — Telephone Encounter (Signed)
Relevant patient education mailed to patient.  

## 2013-10-29 ENCOUNTER — Other Ambulatory Visit: Payer: Self-pay | Admitting: Family Medicine

## 2013-11-06 ENCOUNTER — Telehealth: Payer: Self-pay

## 2013-11-06 NOTE — Telephone Encounter (Signed)
Relevant patient education assigned to patient using Emmi. ° °

## 2013-12-15 ENCOUNTER — Other Ambulatory Visit: Payer: Self-pay | Admitting: Family Medicine

## 2014-02-25 ENCOUNTER — Other Ambulatory Visit: Payer: Self-pay | Admitting: Family Medicine

## 2014-03-04 ENCOUNTER — Ambulatory Visit (INDEPENDENT_AMBULATORY_CARE_PROVIDER_SITE_OTHER): Payer: BC Managed Care – PPO | Admitting: Cardiology

## 2014-03-04 ENCOUNTER — Encounter: Payer: Self-pay | Admitting: Cardiology

## 2014-03-04 ENCOUNTER — Encounter: Payer: Self-pay | Admitting: *Deleted

## 2014-03-04 VITALS — BP 130/58 | HR 57 | Ht 71.0 in | Wt 277.0 lb

## 2014-03-04 DIAGNOSIS — I10 Essential (primary) hypertension: Secondary | ICD-10-CM

## 2014-03-04 DIAGNOSIS — E785 Hyperlipidemia, unspecified: Secondary | ICD-10-CM

## 2014-03-04 DIAGNOSIS — I2510 Atherosclerotic heart disease of native coronary artery without angina pectoris: Secondary | ICD-10-CM

## 2014-03-04 DIAGNOSIS — I350 Nonrheumatic aortic (valve) stenosis: Secondary | ICD-10-CM

## 2014-03-04 DIAGNOSIS — I359 Nonrheumatic aortic valve disorder, unspecified: Secondary | ICD-10-CM

## 2014-03-04 NOTE — Assessment & Plan Note (Signed)
Plan repeat echocardiogram. 

## 2014-03-04 NOTE — Progress Notes (Signed)
HPI: FU coronary artery disease. The patient is status post coronary artery bypassing graft. Last Myoview in August of 2013 showed an ejection fraction of 56%. There was no scar or ischemia. Echocardiogram in August of 2013 showed normal LV function. There was mild to moderate aortic stenosis with a mean gradient of 20 mm mercury. There was mild left atrial enlargement. Abd ultrasound 9/14 showed no aneurysm. Since I last saw him, He denies dyspnea, chest pain, palpitations or syncope.   Current Outpatient Prescriptions  Medication Sig Dispense Refill  . amLODipine-benazepril (LOTREL) 5-20 MG per capsule TAKE ONE CAPSULE BY MOUTH EVERY DAY  90 capsule  3  . aspirin 325 MG tablet Take 325 mg by mouth daily.        . betamethasone dipropionate (DIPROLENE) 0.05 % cream Apply topically 2 (two) times daily.  30 g  6  . Blood Glucose Monitoring Suppl ([facility]) W/DEVICE KIT by Does not apply route. Dispensed 08/30/11      . levothyroxine (SYNTHROID, LEVOTHROID) 200 MCG tablet TAKE 1 TABLET BY MOUTH EVERY DAY  90 tablet  3  . metFORMIN (GLUCOPHAGE) 500 MG tablet TAKE 1 TABLET BY MOUTH 2 (TWO) TIMES DAILY WITH A MEAL.  180 tablet  3  . metoprolol (LOPRESSOR) 50 MG tablet TAKE 1 TABLET BY MOUTH EVERY DAY  90 tablet  1  . Multiple Vitamin (MULTIVITAMIN) capsule Take 1 capsule by mouth daily.        Glory Rosebush VERIO test strip TEST DAILY AS NEEDED  100 each  3  . valsartan-hydrochlorothiazide (DIOVAN-HCT) 80-12.5 MG per tablet Take 1 tablet by mouth daily.  90 tablet  3  . valsartan-hydrochlorothiazide (DIOVAN-HCT) 80-12.5 MG per tablet TAKE 1 TABLET BY MOUTH DAILY.  90 tablet  1   No current facility-administered medications for this visit.     Past Medical History  Diagnosis Date  . HYPOTHYROIDISM 05/08/2010  . DM 05/08/2010  . HYPERLIPIDEMIA 05/08/2010  . HYPERTENSION, BENIGN 04/13/2008  . CAD, NATIVE VESSEL 04/13/2008  . Aortic stenosis     Past Surgical History    Procedure Laterality Date  . Coronary artery bypass graft  2002    History   Social History  . Marital Status: Married    Spouse Name: N/A    Number of Children: N/A  . Years of Education: N/A   Occupational History  . Not on file.   Social History Main Topics  . Smoking status: Former Smoker -- 1.00 packs/day for 30 years    Types: Cigarettes    Quit date: 07/30/1999  . Smokeless tobacco: Former Systems developer    Quit date: 07/18/2000  . Alcohol Use: Yes     Comment: rare  . Drug Use: No  . Sexual Activity: Not on file     Comment: regular exercise    Other Topics Concern  . Not on file   Social History Narrative  . No narrative on file    ROS: no fevers or chills, productive cough, hemoptysis, dysphasia, odynophagia, melena, hematochezia, dysuria, hematuria, rash, seizure activity, orthopnea, PND, pedal edema, claudication. Remaining systems are negative.  Physical Exam: Well-developed well-nourished in no acute distress.  Skin is warm and dry.  HEENT is normal.  Neck is supple.  Chest is clear to auscultation with normal expansion.  Cardiovascular exam is regular rate and rhythm. 3/6 systolic murmur left sternal border. Abdominal exam nontender or distended. No masses palpated. Extremities show no edema. neuro grossly intact  ECG Sinus rhythm at a rate of 57. Anterior lateral T-wave inversion.

## 2014-03-04 NOTE — Assessment & Plan Note (Signed)
Continue statin. Lipids and liver monitored by primary care. 

## 2014-03-04 NOTE — Patient Instructions (Signed)

## 2014-03-04 NOTE — Assessment & Plan Note (Signed)
Blood pressure controlled. Continue present medications. 

## 2014-03-04 NOTE — Assessment & Plan Note (Signed)
Continue aspirin and statin. 

## 2014-03-09 ENCOUNTER — Ambulatory Visit (HOSPITAL_COMMUNITY)
Admission: RE | Admit: 2014-03-09 | Discharge: 2014-03-09 | Disposition: A | Discharge status: Home / Self Care | Payer: BC Managed Care – PPO | Source: Ambulatory Visit | Attending: Cardiology | Admitting: Cardiology

## 2014-03-09 DIAGNOSIS — E785 Hyperlipidemia, unspecified: Secondary | ICD-10-CM | POA: Diagnosis not present

## 2014-03-09 DIAGNOSIS — I359 Nonrheumatic aortic valve disorder, unspecified: Secondary | ICD-10-CM

## 2014-03-09 DIAGNOSIS — I350 Nonrheumatic aortic (valve) stenosis: Secondary | ICD-10-CM

## 2014-03-09 DIAGNOSIS — I10 Essential (primary) hypertension: Secondary | ICD-10-CM | POA: Diagnosis not present

## 2014-03-09 DIAGNOSIS — I2510 Atherosclerotic heart disease of native coronary artery without angina pectoris: Secondary | ICD-10-CM | POA: Diagnosis not present

## 2014-03-09 DIAGNOSIS — E119 Type 2 diabetes mellitus without complications: Secondary | ICD-10-CM | POA: Diagnosis not present

## 2014-03-09 DIAGNOSIS — Z87891 Personal history of nicotine dependence: Secondary | ICD-10-CM | POA: Diagnosis not present

## 2014-03-09 DIAGNOSIS — Z951 Presence of aortocoronary bypass graft: Secondary | ICD-10-CM | POA: Diagnosis not present

## 2014-03-09 NOTE — Progress Notes (Signed)
2D Echocardiogram Complete.  03/09/2014   [name], [place]

## 2014-03-29 ENCOUNTER — Encounter: Payer: Self-pay | Admitting: Family Medicine

## 2014-03-30 ENCOUNTER — Other Ambulatory Visit: Payer: Self-pay

## 2014-03-30 ENCOUNTER — Telehealth: Payer: Self-pay

## 2014-03-30 NOTE — Telephone Encounter (Signed)
Changed medication list per patient request through mychart.

## 2014-04-04 ENCOUNTER — Other Ambulatory Visit: Payer: Self-pay | Admitting: Family Medicine

## 2014-04-12 ENCOUNTER — Other Ambulatory Visit: Payer: Self-pay | Admitting: Family Medicine

## 2014-04-28 ENCOUNTER — Encounter: Payer: Self-pay | Admitting: Family Medicine

## 2014-04-28 ENCOUNTER — Encounter: Payer: Self-pay | Admitting: Internal Medicine

## 2014-04-28 ENCOUNTER — Other Ambulatory Visit: Payer: Self-pay | Admitting: Family Medicine

## 2014-04-28 ENCOUNTER — Ambulatory Visit (INDEPENDENT_AMBULATORY_CARE_PROVIDER_SITE_OTHER): Payer: BC Managed Care – PPO | Admitting: Family Medicine

## 2014-04-28 VITALS — BP 130/70 | HR 57 | Temp 97.6°F | Wt 276.0 lb

## 2014-04-28 DIAGNOSIS — R1310 Dysphagia, unspecified: Secondary | ICD-10-CM

## 2014-04-28 DIAGNOSIS — I10 Essential (primary) hypertension: Secondary | ICD-10-CM

## 2014-04-28 DIAGNOSIS — E119 Type 2 diabetes mellitus without complications: Secondary | ICD-10-CM

## 2014-04-28 LAB — HEMOGLOBIN A1C: Hgb A1c MFr Bld: 7.1 % — ABNORMAL HIGH (ref 4.6–6.5)

## 2014-04-28 MED ORDER — METOPROLOL TARTRATE 50 MG PO TABS: ORAL_TABLET | ORAL | Status: DC

## 2014-04-28 MED ORDER — GLUCOSE BLOOD VI STRP: ORAL_STRIP | Status: DC

## 2014-04-28 NOTE — Progress Notes (Signed)
Pre visit review using our clinic review tool, if applicable. No additional management support is needed unless otherwise documented below in the visit note. 

## 2014-04-28 NOTE — Patient Instructions (Addendum)
Avoid using NSAIDS for ankle pain due to possible damage to the kidneys with having diabetes.  Ice the ankle 3 times a day for 15-20 min at a time.  Protect the skin so it doesn't get too cold.  Use tylenol for pain as needed.

## 2014-04-28 NOTE — Progress Notes (Signed)
Subjective:    Patient ID: [name], male    DOB: 03/08/1941, 73 y.o.   MRN: [id number]  HPI 73 year old male presents today for 6 month follow-up.  He states that all chronic issues are well controlled right now.  He has had several months of food becoming stuck while eating.  This always happens at the beginning of a meal where he is eating solid foods such as meat and other starches.  He is able to swallow the food and then it lodges in the chest.  He states that he has always eaten very fast with little chewing.  He is trying to be more cautious of this now.  Liquids cause no issues.  Denies weight loss, change in appetite, choking, reflux or vomiting.  Denies ever having an issue like this in the past.  Has never seen GI or had an endoscopy. Also complains of left ankle pain x 5 weeks.  Pain is on the lateral ankle radiating to the top of the foot.  No pain when walking; that seems to loosen it up some.  Pain is worse at night and first thing in the morning until he starts moving.  Denies swelling or precipitating injury.  He has tried ibuprofen a few times for pain relief.    Past Medical History  Diagnosis Date  . HYPOTHYROIDISM 05/08/2010  . DM 05/08/2010  . HYPERLIPIDEMIA 05/08/2010  . HYPERTENSION, BENIGN 04/13/2008  . CAD, NATIVE VESSEL 04/13/2008  . Aortic stenosis    Past Surgical History  Procedure Laterality Date  . Coronary artery bypass graft  2002    reports that he quit smoking about 14 years ago. His smoking use included Cigarettes. He has a 30 pack-year smoking history. He quit smokeless tobacco use about 13 years ago. He reports that he drinks alcohol. He reports that he does not use illicit drugs. family history includes Alcohol abuse in his father; Colon cancer (age of onset: 61) in his father; Coronary artery disease in his brother and mother; Diabetes in his mother; Diabetes type II in his mother; Hypertension in his mother. No Known  Allergies    Review of Systems  Constitutional: Negative for fever, activity change, appetite change and unexpected weight change.  HENT: Positive for postnasal drip. Negative for congestion, ear pain and sore throat.        Has seasonal allergies.  Has used nasal spray in the past.  Eyes: Negative for photophobia and visual disturbance.       Follows with optometry due to the diabetes.  Respiratory: Negative for cough, choking and chest tightness.        When the food becomes lodged, he denies SOB or choking.  He occasionally tries to burp to move the food along.  Cardiovascular: Negative for chest pain.  Gastrointestinal: Negative for nausea, vomiting, abdominal pain, diarrhea, constipation, blood in stool and abdominal distention.  Endocrine: Negative.   Genitourinary: Negative for dysuria and frequency.       Objective:   Physical Exam  Constitutional: He is oriented to person, place, and time. He appears well-developed and well-nourished. No distress.  Neck: Neck supple. No tracheal deviation present. No thyromegaly present.  Cardiovascular: Normal rate, regular rhythm and intact distal pulses.   Murmur heard. Has a known aortic stenosis murmur that radiates to the carotids and is followed by cardiology  Pulmonary/Chest: Effort normal and breath sounds normal. No respiratory distress. He has no wheezes. He has no rales.  Abdominal:  Soft. He exhibits no distension. There is no tenderness.  Musculoskeletal: He exhibits no edema.  L ankle tenderness over later and dorsal aspect.  Pain radiates to top of foot and just above the ankle.  Decreased ROM with inversion and dorsiflexion.   Neurological: He is alert and oriented to person, place, and time.  Skin: He is not diaphoretic.          Assessment & Plan:  Assessment: 1. Diabetes Mellitus Type II- well controlled 2. Solid food dysphagia.  DDx: esophageal stricture, malignancy  3. Tendonitis to the ankle.  DDx:  osteoarthritis 4. Hyperlipidemia 5. Hypertension, benign   Plan: 1. Continue on prescribed medications as directed (metformin).  Continue to monitor blood glucose at home.  Last A1C was 6.4; check again today. 2. Patient referred to GI for endoscopy. No red flags for malignancy such as weight loss and loss of appetite. 3. Ice the ankle 3 times a day for 15-20 minutes.  Tylenol for pain PRN.  Patient was advised to avoid NSAID use due to possible kidney damage with the diabetes.  If pain does not resolve, patient was told to return and an x-ray would be ordered. 4. Continue on prescribed medications (vytorin).  Last labs checked were pertinent for increased TG, which is chronic.  5. Continue on prescribed medications (amlodipine-benzepril, metoprolol).  Follows with cardiology.  Joseph Art PA-S  Agree with assesement above.  He does not have any appetite or weight changes to suggest malignancy.  Needs further evaluation and referral to GI made.    Carolann Littler MD

## 2014-04-29 ENCOUNTER — Telehealth: Payer: Self-pay | Admitting: Family Medicine

## 2014-04-29 NOTE — Telephone Encounter (Signed)
emmi emailed °

## 2014-04-30 ENCOUNTER — Encounter: Payer: Self-pay | Admitting: Internal Medicine

## 2014-06-29 ENCOUNTER — Encounter: Payer: Self-pay | Admitting: Internal Medicine

## 2014-06-29 ENCOUNTER — Ambulatory Visit (INDEPENDENT_AMBULATORY_CARE_PROVIDER_SITE_OTHER): Payer: BC Managed Care – PPO | Admitting: Internal Medicine

## 2014-06-29 VITALS — BP 122/68 | HR 56 | Ht 71.0 in | Wt 266.8 lb

## 2014-06-29 DIAGNOSIS — R1310 Dysphagia, unspecified: Secondary | ICD-10-CM

## 2014-06-29 DIAGNOSIS — R1319 Other dysphagia: Secondary | ICD-10-CM

## 2014-06-29 DIAGNOSIS — R1314 Dysphagia, pharyngoesophageal phase: Secondary | ICD-10-CM

## 2014-06-29 DIAGNOSIS — R05 Cough: Secondary | ICD-10-CM

## 2014-06-29 DIAGNOSIS — R059 Cough, unspecified: Secondary | ICD-10-CM

## 2014-06-29 DIAGNOSIS — Z1211 Encounter for screening for malignant neoplasm of colon: Secondary | ICD-10-CM

## 2014-06-29 MED ORDER — PANTOPRAZOLE SODIUM 40 MG PO TBEC: 40.0000 mg | DELAYED_RELEASE_TABLET | Freq: Every day | ORAL | Status: DC

## 2014-06-29 NOTE — Patient Instructions (Signed)
You have been scheduled for an endoscopy. Please follow written instructions given to you at your visit today. If you use inhalers (even only as needed), please bring them with you on the day of your procedure. Your physician has requested that you go to www.startemmi.com and enter the access code given to you at your visit today. This web site gives a general overview about your procedure. However, you should still follow specific instructions given to you by our office regarding your preparation for the procedure.  Pantoprazole 40mg  once a day will be sent to your pharmacy

## 2014-06-29 NOTE — Progress Notes (Signed)
Subjective:    Patient ID: [name], male    DOB: 06-29-1941, 73 y.o.   MRN: [id number]  HPI [name] is a 73 yo male with PMH of diabetes, hypertension, hyperlipidemia, hypothyroidism, CAD status post CABG, mild aortic stenosis, family history of colon cancer in his father at age 71 who is seen to evaluate solid food dysphagia at the request of Dr. Elease Hashimoto.  He was an established patient with Dr. Sharlett Iles prior to his retirement and his last colonoscopy was on 04/09/2013. This examination was normal without polyps. No record of prior adenomatous colon polyp. Over the last 6 months or so he has developed solid food dysphagia. This is worse with foods such as breads and meats. He has tried to alter the way he is eating including taking smaller bites and chewing his food more completely. This has helped with his swallowing. No liquid food dysphagia and no odynophagia. It sounds like he is having very transient esophageal impaction where the food will stick in his chest and he is unable to eat or drink further until he feels the food pass. This is never lasted more than 15 minutes and after this he tends to swallow better for the rest of that meal. He denies heartburn but has noted some morning cough and increased mucus and phlegm in his throat. Denies hoarseness. Denies dyspnea or chest pain. Has lost about 4 pounds but reports a good appetite and denies early satiety. Bowel movements have been regular occurring 2-3 times daily without blood in his stool or melena. No fevers or chills or other complaint today.   Review of Systems As per history of present illness, otherwise negative  Current Medications, Allergies, Past Medical History, Past Surgical History, Family History and Social History were reviewed in [employer] record.     Objective:   Physical Exam BP 122/68 mmHg  Pulse 56  Ht 5\' 11"  (1.803 m)  Wt 266 lb 12.8 oz (121.02 kg)  BMI 37.23  kg/m2 Constitutional: Well-developed and well-nourished. No distress. HEENT: Normocephalic and atraumatic. Oropharynx is clear and moist. No oropharyngeal exudate. Conjunctivae are normal.  No scleral icterus. Neck: Neck supple. Trachea midline. Cardiovascular: Normal rate, regular rhythm and intact distal pulses. 2/6 SEM Pulmonary/chest: Effort normal and breath sounds normal. No wheezing, rales or rhonchi. Abdominal: Soft, obese, nontender, nondistended. Bowel sounds active throughout. There are no masses palpable. No hepatosplenomegaly. Extremities: no clubbing, cyanosis, or edema Lymphadenopathy: No cervical adenopathy noted. Neurological: Alert and oriented to person place and time. Skin: Skin is warm and dry. No rashes noted. Psychiatric: Normal mood and affect. Behavior is normal.   Colonoscopy 2014, 2009 -- reviewed, distal hyperplastic polyp 2009    Assessment & Plan:  73 yo male with PMH of diabetes, hypertension, hyperlipidemia, hypothyroidism, CAD status post CABG, mild aortic stenosis, family history of colon cancer in his father at age 64 who is seen to evaluate solid food dysphagia at the request of Dr. Elease Hashimoto.  1. Esophageal dysphagia -- I recommended direct visualization with endoscopy. He may require dilation. We discussed the risks and benefits of upper endoscopy with dilation and he is agreeable to proceed. My suspicion is that this is a benign stricture but endoscopy will also rule out malignant process. I'm starting him on pantoprazole 40 mg daily to control likely GERD with atypical symptoms mainly coughing. He also may have some esophagitis contributing to dysphagia and I would like this to improve before endoscopy and dilation of possible.  Further recommendations after upper endoscopy.  2. CRC screening/family history of colon cancer -- multiple prior colonoscopies without history of adenomatous polyp. His father's colon cancer was very late in life at age 56. We  discussed that this information along with his lack of prior adenomatous colon polyps argues very strongly against any genetic polyp syndrome or elevated risk for colon cancer. He understands this. Based on this discussion his next colonoscopy would be recommended in 2024 around age 71. This will be based on his overall health condition at that time. He understands and is comfortable with this plan

## 2014-07-12 ENCOUNTER — Telehealth: Payer: Self-pay

## 2014-07-12 ENCOUNTER — Encounter: Payer: Self-pay | Admitting: Internal Medicine

## 2014-07-12 ENCOUNTER — Ambulatory Visit (AMBULATORY_SURGERY_CENTER): Payer: BLUE CROSS/BLUE SHIELD | Admitting: Internal Medicine

## 2014-07-12 ENCOUNTER — Other Ambulatory Visit (INDEPENDENT_AMBULATORY_CARE_PROVIDER_SITE_OTHER): Payer: BC Managed Care – PPO

## 2014-07-12 ENCOUNTER — Other Ambulatory Visit: Payer: Self-pay

## 2014-07-12 VITALS — BP 161/90 | HR 77 | Temp 98.0°F | Resp 16 | Ht 71.0 in | Wt 266.0 lb

## 2014-07-12 DIAGNOSIS — R1319 Other dysphagia: Secondary | ICD-10-CM

## 2014-07-12 DIAGNOSIS — K2289 Other specified disease of esophagus: Secondary | ICD-10-CM

## 2014-07-12 DIAGNOSIS — K228 Other specified diseases of esophagus: Secondary | ICD-10-CM

## 2014-07-12 DIAGNOSIS — R1310 Dysphagia, unspecified: Secondary | ICD-10-CM

## 2014-07-12 DIAGNOSIS — K229 Disease of esophagus, unspecified: Secondary | ICD-10-CM

## 2014-07-12 DIAGNOSIS — R1314 Dysphagia, pharyngoesophageal phase: Secondary | ICD-10-CM

## 2014-07-12 HISTORY — PX: ESOPHAGOGASTRODUODENOSCOPY ENDOSCOPY: SHX5814

## 2014-07-12 LAB — CBC WITH DIFFERENTIAL/PLATELET
Basophils Absolute: 0 10*3/uL (ref 0.0–0.1)
Basophils Relative: 0.4 % (ref 0.0–3.0)
EOS ABS: 0.2 10*3/uL (ref 0.0–0.7)
EOS PCT: 1.9 % (ref 0.0–5.0)
HEMATOCRIT: 40.5 % (ref 39.0–52.0)
Hemoglobin: 13.3 g/dL (ref 13.0–17.0)
Lymphocytes Relative: 16.8 % (ref 12.0–46.0)
Lymphs Abs: 1.4 10*3/uL (ref 0.7–4.0)
MCHC: 32.9 g/dL (ref 30.0–36.0)
MCV: 88.6 fl (ref 78.0–100.0)
MONOS PCT: 9.7 % (ref 3.0–12.0)
Monocytes Absolute: 0.8 10*3/uL (ref 0.1–1.0)
NEUTROS ABS: 5.9 10*3/uL (ref 1.4–7.7)
NEUTROS PCT: 71.2 % (ref 43.0–77.0)
PLATELETS: 204 10*3/uL (ref 150.0–400.0)
RBC: 4.58 Mil/uL (ref 4.22–5.81)
RDW: 12.4 % (ref 11.5–15.5)
WBC: 8.3 10*3/uL (ref 4.0–10.5)

## 2014-07-12 LAB — COMPREHENSIVE METABOLIC PANEL
ALK PHOS: 47 U/L (ref 39–117)
ALT: 23 U/L (ref 0–53)
AST: 24 U/L (ref 0–37)
Albumin: 4 g/dL (ref 3.5–5.2)
BUN: 16 mg/dL (ref 6–23)
CO2: 27 mEq/L (ref 19–32)
CREATININE: 1.3 mg/dL (ref 0.4–1.5)
Calcium: 9.6 mg/dL (ref 8.4–10.5)
Chloride: 104 mEq/L (ref 96–112)
GFR: 56.78 mL/min — ABNORMAL LOW (ref >60.00)
Glucose, Bld: 122 mg/dL — ABNORMAL HIGH (ref 70–99)
Potassium: 4.4 mEq/L (ref 3.5–5.1)
Sodium: 140 mEq/L (ref 135–145)
TOTAL PROTEIN: 7.1 g/dL (ref 6.0–8.3)
Total Bilirubin: 0.8 mg/dL (ref 0.2–1.2)

## 2014-07-12 LAB — GLUCOSE, CAPILLARY
GLUCOSE-CAPILLARY: 121 mg/dL — AB (ref 70–99)
Glucose-Capillary: 115 mg/dL — ABNORMAL HIGH (ref 70–99)

## 2014-07-12 MED ORDER — SODIUM CHLORIDE 0.9 % IV SOLN: 500.0000 mL | INTRAVENOUS | Status: DC

## 2014-07-12 NOTE — Patient Instructions (Signed)
You will go to the basement lab today for CBC and CMP.  A CTscan of the chest and abdomen will be tomorrow at [facility] at 2:30 pm. On arrival home place the contrast given in the [facility]. Dr. Vena Rua nurse will call you with Surgical and oncology consults.  Continue your PPI 20-30 prior to your morning meal.     YOU HAD AN ENDOSCOPIC PROCEDURE TODAY AT [facility]: Refer to the procedure report that was given to you for any specific questions about what was found during the examination.  If the procedure report does not answer your questions, please call your gastroenterologist to clarify.  If you requested that your care partner not be given the details of your procedure findings, then the procedure report has been included in a sealed envelope for you to review at your convenience later.  YOU SHOULD EXPECT: Some feelings of bloating in the abdomen. Passage of more gas than usual.  Walking can help get rid of the air that was put into your GI tract during the procedure and reduce the bloating. If you had a lower endoscopy (such as a colonoscopy or flexible sigmoidoscopy) you may notice spotting of blood in your stool or on the toilet paper. If you underwent a bowel prep for your procedure, then you may not have a normal bowel movement for a few days.  DIET: Your first meal following the procedure should be a light meal and then it is ok to progress to your normal diet.  A half-sandwich or bowl of soup is an example of a good first meal.  Heavy or fried foods are harder to digest and may make you feel nauseous or bloated.  Likewise meals heavy in dairy and vegetables can cause extra gas to form and this can also increase the bloating.  Drink plenty of fluids but you should avoid alcoholic beverages for 24 hours.  ACTIVITY: Your care partner should take you home directly after the procedure.  You should plan to take it easy, moving slowly for the rest of the day.  You can resume  normal activity the day after the procedure however you should NOT DRIVE or use heavy machinery for 24 hours (because of the sedation medicines used during the test).    SYMPTOMS TO REPORT IMMEDIATELY: A gastroenterologist can be reached at any hour.  During normal business hours, 8:30 AM to 5:00 PM Monday through Friday, call [phone].  After hours and on weekends, please call the GI answering service at [phone] who will take a message and have the physician on call contact you.   Following upper endoscopy (EGD)  Vomiting of blood or coffee ground material  New chest pain or pain under the shoulder blades  Painful or persistently difficult swallowing  New shortness of breath  Fever of 100F or higher  Black, tarry-looking stools  FOLLOW UP: If any biopsies were taken you will be contacted by phone or by letter within the next 1-3 weeks.  Call your gastroenterologist if you have not heard about the biopsies in 3 weeks.  Our staff will call the home number listed on your records the next business day following your procedure to check on you and address any questions or concerns that you may have at that time regarding the information given to you following your procedure. This is a courtesy call and so if there is no answer at the home number and we have not heard from you through  the emergency physician on call, we will assume that you have returned to your regular daily activities without incident.  SIGNATURES/CONFIDENTIALITY: You and/or your care partner have signed paperwork which will be entered into your electronic medical record.  These signatures attest to the fact that that the information above on your After Visit Summary has been reviewed and is understood.  Full responsibility of the confidentiality of this discharge information lies with you and/or your care-partner.

## 2014-07-12 NOTE — Progress Notes (Signed)
Called to room to assist during endoscopic procedure.  Patient ID and intended procedure confirmed with present staff. Received instructions for my participation in the procedure from the performing physician.  

## 2014-07-12 NOTE — Op Note (Signed)
[facility]  [address] [place], [address]   ENDOSCOPY PROCEDURE REPORT  PATIENT: [name], [name]  MR#: #[id number] BIRTHDATE: 1941-03-31 , 73  yrs. old GENDER: male ENDOSCOPIST: Jerene Bears, MD REFERRED BY:  Carolann Littler, M.D. PROCEDURE DATE:  07/12/2014 PROCEDURE:  EGD w/ biopsy ASA CLASS:     Class III INDICATIONS:  dysphagia. MEDICATIONS: Monitored anesthesia care and Propofol 200 mg IV TOPICAL ANESTHETIC: none  DESCRIPTION OF PROCEDURE: After the risks benefits and alternatives of the procedure were thoroughly explained, informed consent was obtained.  The LB [id number] endoscope was introduced through the mouth and advanced to the second portion of the duodenum , Without limitations.  The instrument was slowly withdrawn as the mucosa was fully examined.  ESOPHAGUS: A half circumferential fungating mass was found in the distal esophagus at 38 cm extending to the GE junction at 43 cm. The lesion narrows the esophageal lumen, but the standard adult upper endoscope passes the tumor without difficulty.  Multiple biopsies were performed using cold forceps. There appears to be circumferential Barrett's mucosa extending up to the top of the tumor.  STOMACH: The mucosa of the stomach appeared normal with small hiatal hernia.  DUODENUM: Mild duodenal inflammation was found in the duodenal bulb and sweep.  The 2nd portion of the duodenum was normal.  Retroflexed views revealed a hiatal hernia.     The scope was then withdrawn from the patient and the procedure completed.  COMPLICATIONS: There were no immediate complications.  ENDOSCOPIC IMPRESSION: 1.   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  Small hiatal hernia 2.   The mucosa of the stomach appeared normal 3.   Mild peptic duodenitis was found in the duodenal bulb  RECOMMENDATIONS: 1.  Await  biopsy results 2.  Continue taking your PPI (antiacid medicine) once daily.  It is best to be taken 20-30 minutes prior to breakfast meal. 3.  CBC and CMP today followed by CT scan chest, abdomen and pelvis with contrast. 4.  Surgical and oncology referral 5.  Soft diet  eSigned:  Jerene Bears, MD 07/12/2014 9:16 AM        JT:[id number] Elease Hashimoto, MD and The Patient

## 2014-07-12 NOTE — Telephone Encounter (Signed)
Pt scheduled for CT of CAP at [facility] CT 07/13/14@2 :30pm. Pt to be NPO after 10:30am, drink bottle 1 of contrast at 12:30pm and bottle 2 at 1:30pm. Pt to hold metformin today and have labs done today. [place] RN to notify pt of appt date, time, and instructions.

## 2014-07-12 NOTE — Progress Notes (Signed)
Procedure ends, to recovery, report given and VSS. 

## 2014-07-13 ENCOUNTER — Telehealth: Payer: Self-pay | Admitting: Internal Medicine

## 2014-07-13 ENCOUNTER — Ambulatory Visit (INDEPENDENT_AMBULATORY_CARE_PROVIDER_SITE_OTHER)
Admission: RE | Admit: 2014-07-13 | Discharge: 2014-07-13 | Disposition: A | Discharge status: Home / Self Care | Payer: BLUE CROSS/BLUE SHIELD | Source: Ambulatory Visit | Attending: Internal Medicine | Admitting: Internal Medicine

## 2014-07-13 ENCOUNTER — Telehealth: Payer: Self-pay | Admitting: *Deleted

## 2014-07-13 ENCOUNTER — Other Ambulatory Visit: Payer: Self-pay

## 2014-07-13 DIAGNOSIS — K2289 Other specified disease of esophagus: Secondary | ICD-10-CM

## 2014-07-13 DIAGNOSIS — K228 Other specified diseases of esophagus: Secondary | ICD-10-CM

## 2014-07-13 DIAGNOSIS — K229 Disease of esophagus, unspecified: Secondary | ICD-10-CM

## 2014-07-13 DIAGNOSIS — C159 Malignant neoplasm of esophagus, unspecified: Secondary | ICD-10-CM

## 2014-07-13 MED ORDER — IOHEXOL 300 MG/ML  SOLN
100.0000 mL | Freq: Once | INTRAMUSCULAR | Status: AC | PRN
  Administered 2014-07-13: 100 mL via INTRAVENOUS

## 2014-07-13 NOTE — Telephone Encounter (Signed)
Discussed pathology result from yesterday's upper endoscopy Esophageal tumor consistent with adenocarcinoma CT scan chest abdomen and pelvis scheduled for later today Referral to Dr. Servando Snare with CT surgery and Dr. Benay Spice with oncology We discussed staging and potential treatment options, all of which will depend on stage. He may need EUS He is understandably nervous and upset but seems to be handling the news as well as possible. His wife was also on the phone. I'll will notify him when I have CT results Time provided for questions and answers and he thanked me for the call

## 2014-07-13 NOTE — Telephone Encounter (Signed)
Referral in epic for Dr. Benay Spice and Dr. Servando Snare.   Pt scheduled to see Dr. Servando Snare 07/20/14@4pm , pt to arrive at 3:45pm. Pt to bring ins card and med list. Instructed pt to call 07/19/14 to confirm the appt. Wife verbalized understanding and is aware of appt.

## 2014-07-13 NOTE — Telephone Encounter (Signed)
  Follow up Call-  Call back number 07/12/2014 04/09/2013  Post procedure Call Back phone  # [phone]  Permission to leave phone message Yes Yes     Patient questions:  Do you have a fever, pain , or abdominal swelling? No. Pain Score  0 *  Have you tolerated food without any problems? Yes.    Have you been able to return to your normal activities? Yes.    Do you have any questions about your discharge instructions: Diet   No. Medications  No. Follow up visit  No.  Do you have questions or concerns about your Care? No.  Actions: * If pain score is 4 or above: No action needed, pain <4.pt's wife said pt did fine after procedure yesterday and is fine this morning ,but that he didn't have any appetite when got home yesterday. Denies nausea or vomiting or fever. CT scan has been scheduled and pt says Dr Hilarie Fredrickson to call them today with pathology results.

## 2014-07-15 ENCOUNTER — Other Ambulatory Visit: Payer: Self-pay

## 2014-07-15 ENCOUNTER — Encounter: Payer: Self-pay | Admitting: Internal Medicine

## 2014-07-15 DIAGNOSIS — C159 Malignant neoplasm of esophagus, unspecified: Secondary | ICD-10-CM

## 2014-07-16 ENCOUNTER — Encounter (HOSPITAL_COMMUNITY): Payer: Self-pay | Admitting: *Deleted

## 2014-07-16 ENCOUNTER — Telehealth: Payer: Self-pay | Admitting: Oncology

## 2014-07-16 ENCOUNTER — Telehealth: Payer: Self-pay | Admitting: *Deleted

## 2014-07-16 NOTE — Telephone Encounter (Signed)
Call from [facility] in [place], pt has an endoscopy scheduled 1/14. Sees Dr. Servando Snare 1/12. Next available appt is 1/19. Reviewed with Dr. Benay Spice: 1/19 is OK for radiation appt.

## 2014-07-16 NOTE — Telephone Encounter (Signed)
S/W PATIENT AND GAVE NP APPT FOR 01/19 @ 1:30 W/DR. SHERRILL REFERRING DR. Ulice Dash PYRTLE DX- ESOPHAGEAL CA    REFERRAL MADE TO [facility]

## 2014-07-20 ENCOUNTER — Institutional Professional Consult (permissible substitution) (INDEPENDENT_AMBULATORY_CARE_PROVIDER_SITE_OTHER): Payer: BLUE CROSS/BLUE SHIELD | Admitting: Cardiothoracic Surgery

## 2014-07-20 ENCOUNTER — Other Ambulatory Visit: Payer: Self-pay | Admitting: *Deleted

## 2014-07-20 ENCOUNTER — Encounter: Payer: Self-pay | Admitting: Cardiothoracic Surgery

## 2014-07-20 ENCOUNTER — Other Ambulatory Visit: Payer: BC Managed Care – PPO

## 2014-07-20 VITALS — BP 147/71 | HR 79 | Resp 20 | Ht 71.0 in | Wt 253.0 lb

## 2014-07-20 DIAGNOSIS — C159 Malignant neoplasm of esophagus, unspecified: Secondary | ICD-10-CM

## 2014-07-20 DIAGNOSIS — K8020 Calculus of gallbladder without cholecystitis without obstruction: Secondary | ICD-10-CM

## 2014-07-20 DIAGNOSIS — Z951 Presence of aortocoronary bypass graft: Secondary | ICD-10-CM

## 2014-07-20 HISTORY — DX: Presence of aortocoronary bypass graft: Z95.1

## 2014-07-20 HISTORY — DX: Calculus of gallbladder without cholecystitis without obstruction: K80.20

## 2014-07-20 NOTE — Progress Notes (Signed)
[name]       [place] [address]             [phone]                    Emanuell Joseph [facility] [place] Medical Record #[id number] Date of Birth: 12-13-1940  Referring: [name] Bears, MD Primary Care: [name] Post, MD  Chief Complaint:    Chief Complaint  Patient presents with  . Esophageal Cancer    sugical eval, Chest/ABD/Pelvis CT 07/13/14, upper endoscopy 07/12/2014    History of Present Illness:    [name] [facility] 74 y.o. 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 The pateint had recent endoscopy because 4-5 months of increasing epigastric pain and trouble swallowing., no blood in stool. Still taking solid food .    Current Activity/ Functional Status:  Patient is independent with mobility/ambulation, transfers, ADL's, IADL's.   Zubrod Score: At the time of surgery this patient's most appropriate activity status/level should be described as: [x]     0    Normal activity, no symptoms []     1    Restricted in physical strenuous activity but ambulatory, able to [name] out light work []     2    Ambulatory and capable of self care, unable to [name] work activities, up and about               >50 % of waking hours                              []     3    Only limited self care, in bed greater than 50% of waking hours []     4    Completely disabled, no self care, confined to bed or chair []     5    Moribund   Past Medical History  Diagnosis Date  . HYPOTHYROIDISM 05/08/2010  . DM 05/08/2010  . HYPERLIPIDEMIA 05/08/2010  . HYPERTENSION, BENIGN 04/13/2008  . CAD, NATIVE VESSEL 04/13/2008  . Aortic stenosis   . Gall bladder stones 07/20/2014  . 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 Coronary artery bypass graft  2002  CABG by Dr Cyndia Bent in 2002  Family History  Problem Relation Age  of Onset  . Coronary artery disease Mother   . Diabetes type II Mother     diabetes type ll  . Hypertension Mother   . Diabetes Mother     type ll  . Coronary artery disease Brother   . Alcohol abuse Father   . Colon cancer Father 53   Father  Died 86  With colon cancer, mother died 36  With RA 68 weeks after cabg never was discharged home 2 sons healthy  History   Social History  . Marital Status: Married    Spouse Name: N/A    Number of Children: N/A  . Years of Education: N/A   Occupational History  . Retired English as a second language teacher    Social History Main Topics  . Smoking status: Former Smoker -- 1.00 packs/day for 30 years    Types: Cigarettes    Quit date: 07/30/1999  . Smokeless tobacco: Former Systems developer    Quit date:  07/18/2000  . Alcohol Use: 0.0 oz/week    0 Not specified per week     Comment: rare  . Drug Use: No  . Sexual Activity: Not on file     Comment: regular exercise      History  Smoking status  . Former Smoker -- 1.00 packs/day for 30 years  . Types: Cigarettes  . Quit date: 07/30/1999  Smokeless tobacco  . Former Systems developer  . Quit date: 07/18/2000    History  Alcohol Use  . 0.0 oz/week  . 0 Not specified per week    Comment: rare     No Known Allergies  Current Outpatient Prescriptions  Medication Sig Dispense Refill  . amLODipine-benazepril (LOTREL) 5-20 MG per capsule Take 1 capsule by mouth every morning.    Marland Kitchen aspirin 325 MG tablet Take 325 mg by mouth daily.      . betamethasone dipropionate (DIPROLENE) 0.05 % cream Apply topically 2 (two) times daily. 30 g 6  . ezetimibe-simvastatin (VYTORIN) 10-40 MG per tablet Take 1 tablet by mouth every morning.    Marland Kitchen levothyroxine (SYNTHROID, LEVOTHROID) 200 MCG tablet Take 200 mcg by mouth daily before breakfast.    . metFORMIN (GLUCOPHAGE) 500 MG tablet Take 500 mg by mouth 2 (two) times daily with a meal.    . metoprolol (LOPRESSOR) 50 MG tablet Take 50 mg by mouth every morning.    . Multiple Vitamin  (MULTIVITAMIN) capsule Take 1 capsule by mouth daily.      . pantoprazole (PROTONIX) 40 MG tablet Take 1 tablet (40 mg total) by mouth daily. 30 tablet 3  . valsartan-hydrochlorothiazide (DIOVAN-HCT) 80-12.5 MG per tablet Take 1 tablet by mouth daily. 90 tablet 3   No current facility-administered medications for this visit.     Review of Systems:     Cardiac Review of Systems: Y or N  Chest Pain [ N   ]  Resting SOB [ N  ] Exertional SOB  [ N ]  Orthopnea [ N ]   Pedal Edema Aqua.Slicker   ]    Palpitations Aqua.Slicker  ] Syncope  [  ]   Presyncope [   ]  General Review of Systems: [Y] = yes [  ]=no Constitional: recent weight change [Y  ];  Wt loss over the last 3 months [  12lbs ] anorexia [  ]; fatigue [  ]; nausea [  ]; night sweats [  ]; fever [  ]; or chills [  ];          Dental: poor dentition[  ]; Last Dentist visit:   Eye : blurred vision [  ]; diplopia [   ]; vision changes [  ];  Amaurosis fugax[  ]; Resp: cough [  ];  wheezing[ N ];  hemoptysis[  ]; shortness of breath[  ]; paroxysmal nocturnal dyspnea[ N ]; GU: kidney stones [  ]; hematuria[  ];   dysuria [  ];  nocturia[  ];  history of     obstruction [  ]; urinary frequency [  ]             Skin: rash, swelling[  ];, hair loss[  ];  peripheral edema[  ];  or itching[  ]; Musculosketetal: myalgias[  N];  joint swelling[ [place]];  joint erythema[Y  ];  joint pain[  ];  back pain[  ];  Heme/Lymph: bruising[  ];  bleeding[  ];  anemia[  ];  Neuro: TIA[  ];  headaches[  ];  stroke[  ];  vertigo[  ];  seizures[  ];   paresthesias[  ];  difficulty walking[  ];  Psych:depression[  ]; anxiety[  ];  Endocrine: diabetes[Y  ];  thyroid dysfunction[  ];  Immunizations: Flu up to date [ Y ]; Pneumococcal up to date Jazmín.Cullens  ];  Other:  Physical Exam: BP 147/71 mmHg  Pulse 79  Resp 20  Ht 5\' 11"  (1.803 m)  Wt 253 lb (114.76 kg)  BMI 35.30 kg/m2  SpO2 98%  PHYSICAL EXAMINATION:  General appearance: alert, cooperative, appears stated age and no  distress Neurologic: intact Heart: regular rate and rhythm, S1, S2 normal, early systolic 2/6  murmur, click, rub or gallop Lungs: clear to auscultation bilaterally Abdomen: soft, non-tender; bowel sounds normal; no masses,  no organomegaly Extremities: extremities normal, atraumatic, no cyanosis or edema and Homans sign is negative, no sign of DVT No carotid bruits Full brachial radial, femoral pedal pulses No cervical axillary or supraclavicular adneopathy   Diagnostic Studies & Laboratory data:     Recent Radiology Findings:  CT chest abdomen 07/13/2014   CLINICAL DATA:  Dysphasia for several months.  Esophageal mass.  EXAM: CT CHEST, ABDOMEN, AND PELVIS WITH CONTRAST  TECHNIQUE: Multidetector CT imaging of the chest, abdomen and pelvis was performed following the standard protocol during bolus administration of intravenous contrast.  CONTRAST:  150mL OMNIPAQUE IOHEXOL 300 MG/ML  SOLN  COMPARISON:  None  FINDINGS: CT CHEST FINDINGS  Mediastinum: Normal heart size. No pericardial effusion. Previous median sternotomy and CABG procedure. Normal appearance of the trachea. The proximal and mid esophagus appears mildly dilated and patulous. Distal esophageal mass measures 3 x 2.0 x 4.2 cm. There is a an adjacent lymph node measuring 7 mm, image 42/series 2. 1 cm lymph node is identified between the esophagus and descending thoracic aorta at the level of the left mainstem bronchus. No right paratracheal or sub- carinal adenopathy identified. There is no hilar adenopathy.  Lungs/Pleura: No pleural effusion identified. There is no suspicious pulmonary nodule or mass.  Musculoskeletal: Mild thoracic spondylosis identified. No aggressive lytic or sclerotic bone lesions.  CT ABDOMEN AND PELVIS FINDINGS  Hepatobiliary: There is no focal liver abnormality. Gallstone measures 1.5 cm, image 61/series 2. No biliary dilatation.  Pancreas: Normal appearance of the pancreas.  Spleen: Negative.  Adrenals/Urinary Tract:  The adrenal glands are normal. Normal appearance of the right kidney. Left renal cysts identified. The urinary bladder appears normal  Stomach/Bowel: The stomach is normal. The small bowel loops have a normal course and caliber without obstruction. Normal appearance of the colon.  Vascular/Lymphatic: Calcified atherosclerotic disease involves the abdominal aorta. No aneurysm. Multiple prominent upper abdominal lymph nodes. Most of these measure less than 1 cm in short axis. Gastrohepatic ligament lymph node measures 1 cm, image 57/series 2. No retroperitoneal adenopathy. No pelvic or inguinal adenopathy.  Reproductive: Prostate gland appears mildly enlarged. Symmetric appearance of the seminal vesicles.  Other: There is no ascites or focal fluid collections within the abdomen or pelvis.  Musculoskeletal: Review of the visualized osseous structures is negative for aggressive lytic or sclerotic bone lesion. Degenerative disc disease is noted within the lower lumbar spine.  IMPRESSION: 1. Distal esophageal mass concerning for primary esophageal carcinoma. 2. Prominent posterior mediastinal lymph nodes measure up to 1 cm. Within the upper abdomen there are multiple small lymph nodes. The largest is in the gastrohepatic ligament region measuring 1 cm. Consider further evaluation with PET-CT. 3. Atherosclerotic  disease. 4. Gallstone.   Electronically Signed   By: [name] M.D.   On: 07/13/2014 15:39      Recent Lab Findings: Lab Results  Component Value Date   WBC 8.3 07/12/2014   HGB 13.3 07/12/2014   HCT 40.5 07/12/2014   PLT 204.0 07/12/2014   GLUCOSE 122* 07/12/2014   CHOL 120 10/27/2013   TRIG 246.0* 10/27/2013   HDL 33.90* 10/27/2013   LDLDIRECT 57.7 08/24/2011   LDLCALC 37 10/27/2013   ALT 23 07/12/2014   AST 24 07/12/2014   NA 140 07/12/2014   K 4.4 07/12/2014   CL 104 07/12/2014   CREATININE 1.3 07/12/2014   BUN 16 07/12/2014   CO2 27 07/12/2014   TSH 0.79 10/27/2013   HGBA1C 7.1*  04/28/2014     ESOPHAGUS: A half circumferential fungating mass was found in the distal esophagus at 38 cm extending to the GE junction at 43 cm. The lesion narrows the esophageal lumen, but the standard adult upper endoscope passes the tumor without difficulty. Multiple biopsies were performed using cold forceps. There appears to be circumferential Barrett's mucosa extending up to the top of the tumor. STOMACH: The mucosa of the stomach appeared normal with small hiatal hernia. DUODENUM: Mild duodenal inflammation was found in the duodenal bulb and sweep. The 2nd portion of the duodenum was normal. Retroflexed views revealed a hiatal hernia. The scope was then withdrawn from the patient and the procedure completed. COMPLICATIONS: There were no immediate complications. ENDOSCOPIC IMPRESSION: 1. 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 Small hiatal hernia 2. The mucosa of the stomach appeared normal 3. Mild peptic duodenitis was found in the duodenal bulb Assessment / Plan:    Path:   Diagnosis Esophagus, biopsy, distal - ADENOCARCINOMA, SEE COMMENT. Microscopic Comment The adenocarcinoma is arising in a background of high grade glandular dysplasia. The adenocarcinoma involves at least lamina propria. Although there is smooth muscle involvement by tumor, hypertrophic and redundant muscularis mucosa is known to occur in Barrett's esophagus. The case was reviewed with Dr. Saralyn [name] who concurs. The case was discussed with Dr. Hilarie [name] on 07/13/2014. 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 Will need cardiology clearance before surgical  Will discuss case with Dr Benay Spice and Sondra Come after work up complete and seen next week   I spent 60 minutes counseling the patient face to face. The total time spent in the appointment was 80 minutes.  [name] Isaac MD      [name] [place] [address] Office [phone]   Beeper [phone]  07/20/2014 8:43 PM

## 2014-07-20 NOTE — Patient Instructions (Signed)
Esophageal Cancer Esophageal cancer occurs when abnormal cells within the esophagus begin to divide rapidly and uncontrollably. The esophagus is the tube that carries food and drink from the throat into the stomach.  CAUSES  The exact cause of esophageal cancer is not known. It is believed that esophageal cancer occurs due to many factors. People are at a higher risk of developing esophageal cancer if they:  Are older than 72 years of age.  Are male.  Smoke or use tobacco.  Drink heavily.  Eat a poor diet (low in fruits and vegetables).  Are overweight (obese).  Have conditions which result in long-standing damage or irritation to the esophagus. These conditions include:  Acid reflux (the stomach acid leaks back up the esophagus, damaging it).  Barrett Esophagus (abnormal cells are found in the lower part of the esophagus, usually due to acid reflux occurring over a long period of time).  Achalasia (the muscles and nerves of the esophagus do not work properly. Food and drink do not move in a normal way down the esophagus and into the stomach).  Esophageal webs (thin strings of tissues grow within the esophagus).  Damage due to toxic exposures (an example would be swallowing a caustic poison such as lye). SYMPTOMS  Symptoms can include:  Trouble swallowing.  Chest or back pain.  Unintentional weight loss.  Severe tiredness (fatigue).  Hoarse voice.  Cough. DIAGNOSIS  Esophageal cancer is usually diagnosed by performing:  Barium swallow. After drinking barium (a liquid that coats the esophagus), a series of X-rays are taken which can reveal abnormalities.  Endoscopic exam. A lighted scope is used to view the inside of the esophagus.  Biopsy. Small pieces of the esophagus are removed for exam in a lab. This can be done through the scope used for an endoscopic exam. TREATMENT  Treatment of esophageal cancer depends on a number of factors, including:   Characteristics  of the cancer cells present.  Tumor size.  Whether the cancer has spread to lymph nodes or to other more distant locations (such as other organs or bone). The types of treatments used for esophageal cancer include:  Surgery to remove as much of the cancer as possible.  Chemotherapy (medicines that kill cancer cells).  Radiation therapy to kill cancer cells.  Combinations of chemotherapy and radiation therapy.  Biological therapy that uses antibodies in ways that can take advantage of the weaknesses of the tumor cells. Your caregivers will also address your needs for pain relief, nutrition, and help with swallowing problems if these develop.  HOME CARE INSTRUCTIONS   Only take over-the-counter or prescription medicines for pain, discomfort or fever as directed by your caregiver.  Maintain a healthy diet. Advice from a nutritionist can be helpful when addressing your specific needs.  Consider joining a support group. This may help you learn to cope with the stress of having esophageal cancer.  Seek advice to help you manage treatment side effects. SEEK MEDICAL CARE IF:  You develop problems with swallowing that are getting worse.  You notice new fatigue or weakness.  You experience unintentional weight loss. SEEK IMMEDIATE MEDICAL CARE IF:  You have a sudden increase in pain.  You have trouble breathing.  You have a fever.  You vomit blood or black material that looks like coffee grounds.  You faint. Document Released: 06/07/2008 Document Revised: 09/17/2011 Document Reviewed: 06/07/2008 [facility] Patient Information 2015 [place], [place]. This information is not intended to replace advice given to you by your health  care provider. Make sure you discuss any questions you have with your health care provider.

## 2014-07-22 ENCOUNTER — Ambulatory Visit (HOSPITAL_COMMUNITY): Payer: BLUE CROSS/BLUE SHIELD | Admitting: Anesthesiology

## 2014-07-22 ENCOUNTER — Ambulatory Visit (HOSPITAL_COMMUNITY)
Admission: RE | Admit: 2014-07-22 | Discharge: 2014-07-22 | Disposition: A | Discharge status: Home / Self Care | Payer: BLUE CROSS/BLUE SHIELD | Source: Ambulatory Visit | Attending: Gastroenterology | Admitting: Gastroenterology

## 2014-07-22 ENCOUNTER — Encounter (HOSPITAL_COMMUNITY): Payer: Self-pay | Admitting: Gastroenterology

## 2014-07-22 ENCOUNTER — Encounter (HOSPITAL_COMMUNITY)
Admission: RE | Disposition: A | Discharge status: Home / Self Care | Payer: Self-pay | Source: Ambulatory Visit | Attending: Gastroenterology

## 2014-07-22 DIAGNOSIS — I350 Nonrheumatic aortic (valve) stenosis: Secondary | ICD-10-CM | POA: Insufficient documentation

## 2014-07-22 DIAGNOSIS — E785 Hyperlipidemia, unspecified: Secondary | ICD-10-CM | POA: Insufficient documentation

## 2014-07-22 DIAGNOSIS — I2510 Atherosclerotic heart disease of native coronary artery without angina pectoris: Secondary | ICD-10-CM | POA: Diagnosis not present

## 2014-07-22 DIAGNOSIS — E119 Type 2 diabetes mellitus without complications: Secondary | ICD-10-CM | POA: Insufficient documentation

## 2014-07-22 DIAGNOSIS — C160 Malignant neoplasm of cardia: Secondary | ICD-10-CM | POA: Diagnosis present

## 2014-07-22 DIAGNOSIS — K2270 Barrett's esophagus without dysplasia: Secondary | ICD-10-CM | POA: Insufficient documentation

## 2014-07-22 DIAGNOSIS — E039 Hypothyroidism, unspecified: Secondary | ICD-10-CM | POA: Insufficient documentation

## 2014-07-22 DIAGNOSIS — Z7982 Long term (current) use of aspirin: Secondary | ICD-10-CM | POA: Insufficient documentation

## 2014-07-22 DIAGNOSIS — Z87891 Personal history of nicotine dependence: Secondary | ICD-10-CM | POA: Diagnosis not present

## 2014-07-22 DIAGNOSIS — C159 Malignant neoplasm of esophagus, unspecified: Secondary | ICD-10-CM

## 2014-07-22 DIAGNOSIS — Z951 Presence of aortocoronary bypass graft: Secondary | ICD-10-CM | POA: Insufficient documentation

## 2014-07-22 DIAGNOSIS — I10 Essential (primary) hypertension: Secondary | ICD-10-CM | POA: Insufficient documentation

## 2014-07-22 HISTORY — PX: EUS: SHX5427

## 2014-07-22 LAB — GLUCOSE, CAPILLARY
GLUCOSE-CAPILLARY: 113 mg/dL — AB (ref 70–99)
Glucose-Capillary: 71 mg/dL (ref 70–99)

## 2014-07-22 SURGERY — UPPER ENDOSCOPIC ULTRASOUND (EUS) LINEAR
Anesthesia: Monitor Anesthesia Care

## 2014-07-22 MED ORDER — MIDAZOLAM HCL 5 MG/5ML IJ SOLN
INTRAMUSCULAR | Status: DC | PRN
  Administered 2014-07-22: 2 mg via INTRAVENOUS

## 2014-07-22 MED ORDER — PROPOFOL INFUSION 10 MG/ML OPTIME
INTRAVENOUS | Status: DC | PRN
  Administered 2014-07-22: 140 ug/kg/min via INTRAVENOUS

## 2014-07-22 MED ORDER — LIDOCAINE HCL (CARDIAC) 20 MG/ML IV SOLN
INTRAVENOUS | Status: AC
Start: 2014-07-22 — End: 2014-07-22
  Filled 2014-07-22: qty 5

## 2014-07-22 MED ORDER — MIDAZOLAM HCL 2 MG/2ML IJ SOLN
INTRAMUSCULAR | Status: AC
Start: 2014-07-22 — End: 2014-07-22
  Filled 2014-07-22: qty 2

## 2014-07-22 MED ORDER — PROPOFOL 10 MG/ML IV BOLUS
INTRAVENOUS | Status: AC
  Filled 2014-07-22: qty 20

## 2014-07-22 MED ORDER — SODIUM CHLORIDE 0.9 % IV SOLN: INTRAVENOUS | Status: DC

## 2014-07-22 MED ORDER — LIDOCAINE HCL (CARDIAC) 20 MG/ML IV SOLN
INTRAVENOUS | Status: DC | PRN
  Administered 2014-07-22: 100 mg via INTRAVENOUS

## 2014-07-22 MED ORDER — LACTATED RINGERS IV SOLN
INTRAVENOUS | Status: DC
  Administered 2014-07-22: 1000 mL via INTRAVENOUS

## 2014-07-22 MED ORDER — PROPOFOL 10 MG/ML IV BOLUS
INTRAVENOUS | Status: AC
Start: 2014-07-22 — End: 2014-07-22
  Filled 2014-07-22: qty 20

## 2014-07-22 NOTE — Discharge Instructions (Signed)

## 2014-07-22 NOTE — Anesthesia Postprocedure Evaluation (Signed)
  Anesthesia Post Note  Patient: [name] [facility]  Procedure(s) Performed: Procedure(s) (LRB): UPPER ENDOSCOPIC ULTRASOUND (EUS) LINEAR (N/A)  Anesthesia type: MAC  Patient location: PACU  Post pain: Pain level controlled  Post assessment: Post-op Vital signs reviewed  Last Vitals:  Filed Vitals:   07/22/14 1105  BP:   Pulse: 73  Temp:   Resp: 14    Post vital signs: Reviewed  Level of consciousness: sedated  Complications: No apparent anesthesia complications

## 2014-07-22 NOTE — Anesthesia Preprocedure Evaluation (Addendum)
Anesthesia Evaluation  Patient identified by MRN, date of birth, ID band Patient awake    Reviewed: Allergy & Precautions, H&P , Patient's Chart, lab work & pertinent test results, reviewed documented beta blocker date and time   History of Anesthesia Complications Negative for: history of anesthetic complications  Airway Mallampati: II  TM Distance: >3 FB Neck ROM: full    Dental   Pulmonary former smoker,  breath sounds clear to auscultation        Cardiovascular Exercise Tolerance: Good hypertension, + CAD + Valvular Problems/Murmurs AS Rhythm:regular Rate:Normal     Neuro/Psych negative psych ROS   GI/Hepatic   Endo/Other  diabetesHypothyroidism   Renal/GU      Musculoskeletal   Abdominal   Peds  Hematology   Anesthesia Other Findings   Reproductive/Obstetrics                            Anesthesia Physical Anesthesia Plan  ASA: III  Anesthesia Plan: MAC   Post-op Pain Management:    Induction:   Airway Management Planned: Nasal Cannula  Additional Equipment:   Intra-op Plan:   Post-operative Plan:   Informed Consent: I have reviewed the patients History and Physical, chart, labs and discussed the procedure including the risks, benefits and alternatives for the proposed anesthesia with the patient or authorized representative who has indicated his/her understanding and acceptance.   Dental Advisory Given  Plan Discussed with: CRNA, Surgeon and Anesthesiologist  Anesthesia Plan Comments:        Anesthesia Quick Evaluation

## 2014-07-22 NOTE — Op Note (Signed)
[facility] [place], [address]   ENDOSCOPIC ULTRASOUND PROCEDURE REPORT  PATIENT: [name], [name]  MR#: [id number] BIRTHDATE: 1941-03-16  GENDER: male ENDOSCOPIST: Milus Banister, MD REFERRED BY:  Zenovia Jarred, MD PROCEDURE DATE:  07/22/2014 PROCEDURE:   Upper EUS ASA CLASS:      Class III INDICATIONS:   1.  recently diagnosed distal esophagus adenocarcinoma, imaging shows no sign of distant metastasis. MEDICATIONS: Monitored anesthesia care  DESCRIPTION OF PROCEDURE:   After the risks benefits and alternatives of the procedure were  explained, informed consent was obtained. The patient was then placed in the left, lateral, decubitus postion and IV sedation was administered. Throughout the procedure, the patients blood pressure, pulse and oxygen saturations were monitored continuously.  Under direct visualization, the Pentax Radial EUS [id number]  endoscope was introduced through the mouth  and advanced to the second portion of the duodenum .  Water was used as necessary to provide an acoustic interface.  Upon completion of the imaging, water was removed and the patient was sent to the recovery room in satisfactory condition.   Endoscopic findings: 1. Non-cirumferential but bulky tumor in distal esophagus. This was 6cm long, the distal end was a the GE junction.  It was slightly difficult to maneuver the large diameter echoendoscopes past the mass.  EUS findings: 1.  The mass above corresponded with a hypoechoic, heterogneous mass that clearly passed into and through the muscularis propria layer of the esophageal wall (uT3). 2. There were 2 hypoechoic lymphnodes adjacent to the proximal edge of the primary tumor that were suspicious for malignancy (homogeneous, round, largest was 9.70mm) (uN1). 3.  I did not appreciate other adenopathy (at gastrohepatic ligament). 4.  Limited views of liver, spleen, pancreas were all normal  ENDOSCOPIC  IMPRESSION: uT3N1, Stage IIIA, non-circumferential 6cm long esophageal adenocarcinoma with distal end at the GE junction.  RECOMMENDATIONS: He will likely benefit from neoadjuvant chemo/XRT prior to attempt at surgical resection.  _______________________________ eSignedMilus Banister, MD 07/22/2014 10:52 AM   CC: Ceasar Mons, MD; Julieanne Manson, MD, Gery Pray, MD

## 2014-07-22 NOTE — Transfer of Care (Signed)
Immediate Anesthesia Transfer of Care Note  Patient: [name] [facility]  Procedure(s) Performed: Procedure(s): UPPER ENDOSCOPIC ULTRASOUND (EUS) LINEAR (N/A)  Patient Location: PACU and Endoscopy Unit  Anesthesia Type:MAC  Level of Consciousness: awake, alert , oriented and patient cooperative  Airway & Oxygen Therapy: Patient Spontanous Breathing and Patient connected to nasal cannula oxygen  Post-op Assessment: Report given to PACU RN, Post -op Vital signs reviewed and stable and Patient moving all extremities  Post vital signs: Reviewed and stable  Complications: No apparent anesthesia complications

## 2014-07-22 NOTE — Anesthesia Procedure Notes (Signed)
Procedure Name: MAC Date/Time: 07/22/2014 10:25 AM Performed by: Carleene Cooper A Pre-anesthesia Checklist: Timeout performed, Patient identified, Emergency Drugs available, Suction available and Patient being monitored Patient Re-evaluated:Patient Re-evaluated prior to inductionOxygen Delivery Method: Nasal cannula Dental Injury: Teeth and Oropharynx as per pre-operative assessment

## 2014-07-22 NOTE — Interval H&P Note (Signed)
History and Physical Interval Note:  07/22/2014 10:16 AM  [name]  has presented today for surgery, with the diagnosis of esophageal adenocarcinoma staging  The various methods of treatment have been discussed with the patient and family. After consideration of risks, benefits and other options for treatment, the patient has consented to  Procedure(s): UPPER ENDOSCOPIC ULTRASOUND (EUS) LINEAR (N/A) as a surgical intervention .  The patient's history has been reviewed, patient examined, no change in status, stable for surgery.  I have reviewed the patient's chart and labs.  Questions were answered to the patient's satisfaction.     Milus Banister

## 2014-07-22 NOTE — H&P (View-Only) (Signed)
[address]       [place] [address]             [phone]                    [name] [facility] Medical Record #[id number] Date of Birth: March 18, 1941  Referring: [name] Bears, MD Primary Care: [name] Post, MD  Chief Complaint:    Chief Complaint  Patient presents with  . Esophageal Cancer    sugical eval, Chest/ABD/Pelvis CT 07/13/14, upper endoscopy 07/12/2014    History of Present Illness:    [name] [facility] 74 y.o. 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 The pateint had recent endoscopy because 4-5 months of increasing epigastric pain and trouble swallowing., no blood in stool. Still taking solid food .    Current Activity/ Functional Status:  Patient is independent with mobility/ambulation, transfers, ADL's, IADL's.   Zubrod Score: At the time of surgery this patient's most appropriate activity status/level should be described as: [x]     0    Normal activity, no symptoms []     1    Restricted in physical strenuous activity but ambulatory, able to do out light work []     2    Ambulatory and capable of self care, unable to do work activities, up and about               >50 % of waking hours                              []     3    Only limited self care, in bed greater than 50% of waking hours []     4    Completely disabled, no self care, confined to bed or chair []     5    Moribund   Past Medical History  Diagnosis Date  . HYPOTHYROIDISM 05/08/2010  . DM 05/08/2010  . HYPERLIPIDEMIA 05/08/2010  . HYPERTENSION, BENIGN 04/13/2008  . CAD, NATIVE VESSEL 04/13/2008  . Aortic stenosis   . Gall bladder stones 07/20/2014  . 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 Coronary artery bypass graft  2002  CABG by Dr [name] in 2002  Family History  Problem Relation Age  of Onset  . Coronary artery disease Mother   . Diabetes type II Mother     diabetes type ll  . Hypertension Mother   . Diabetes Mother     type ll  . Coronary artery disease Brother   . Alcohol abuse Father   . Colon cancer Father 62   Father  Died 86  With colon cancer, mother died 53  With RA 32 weeks after cabg never was discharged home 2 sons healthy  History   Social History  . Marital Status: Married    Spouse Name: N/A    Number of Children: N/A  . Years of Education: N/A   Occupational History  . Retired English as a second language teacher    Social History Main Topics  . Smoking status: Former Smoker -- 1.00 packs/day for 30 years    Types: Cigarettes    Quit date: 07/30/1999  . Smokeless tobacco: Former Systems developer    Quit date:  07/18/2000  . Alcohol Use: 0.0 oz/week    0 Not specified per week     Comment: rare  . Drug Use: No  . Sexual Activity: Not on file     Comment: regular exercise      History  Smoking status  . Former Smoker -- 1.00 packs/day for 30 years  . Types: Cigarettes  . Quit date: 07/30/1999  Smokeless tobacco  . Former Systems developer  . Quit date: 07/18/2000    History  Alcohol Use  . 0.0 oz/week  . 0 Not specified per week    Comment: rare     No Known Allergies  Current Outpatient Prescriptions  Medication Sig Dispense Refill  . amLODipine-benazepril (LOTREL) 5-20 MG per capsule Take 1 capsule by mouth every morning.    Marland Kitchen aspirin 325 MG tablet Take 325 mg by mouth daily.      . betamethasone dipropionate (DIPROLENE) 0.05 % cream Apply topically 2 (two) times daily. 30 g 6  . ezetimibe-simvastatin (VYTORIN) 10-40 MG per tablet Take 1 tablet by mouth every morning.    Marland Kitchen levothyroxine (SYNTHROID, LEVOTHROID) 200 MCG tablet Take 200 mcg by mouth daily before breakfast.    . metFORMIN (GLUCOPHAGE) 500 MG tablet Take 500 mg by mouth 2 (two) times daily with a meal.    . metoprolol (LOPRESSOR) 50 MG tablet Take 50 mg by mouth every morning.    . Multiple Vitamin  (MULTIVITAMIN) capsule Take 1 capsule by mouth daily.      . pantoprazole (PROTONIX) 40 MG tablet Take 1 tablet (40 mg total) by mouth daily. 30 tablet 3  . valsartan-hydrochlorothiazide (DIOVAN-HCT) 80-12.5 MG per tablet Take 1 tablet by mouth daily. 90 tablet 3   No current facility-administered medications for this visit.     Review of Systems:     Cardiac Review of Systems: Y or N  Chest Pain [ N   ]  Resting SOB [ N  ] Exertional SOB  [ N ]  Orthopnea [ N ]   Pedal Edema Aqua.Slicker   ]    Palpitations Aqua.Slicker  ] Syncope  [  ]   Presyncope [   ]  General Review of Systems: [Y] = yes [  ]=no Constitional: recent weight change [Y  ];  Wt loss over the last 3 months [  12lbs ] anorexia [  ]; fatigue [  ]; nausea [  ]; night sweats [  ]; fever [  ]; or chills [  ];          Dental: poor dentition[  ]; Last Dentist visit:   Eye : blurred vision [  ]; diplopia [   ]; vision changes [  ];  Amaurosis fugax[  ]; Resp: cough [  ];  wheezing[ N ];  hemoptysis[  ]; shortness of breath[  ]; paroxysmal nocturnal dyspnea[ N ]; GU: kidney stones [  ]; hematuria[  ];   dysuria [  ];  nocturia[  ];  history of     obstruction [  ]; urinary frequency [  ]             Skin: rash, swelling[  ];, hair loss[  ];  peripheral edema[  ];  or itching[  ]; Musculosketetal: myalgias[  N];  joint swelling[ [place]];  joint erythema[Y  ];  joint pain[  ];  back pain[  ];  Heme/Lymph: bruising[  ];  bleeding[  ];  anemia[  ];  Neuro: TIA[  ];  headaches[  ];  stroke[  ];  vertigo[  ];  seizures[  ];   paresthesias[  ];  difficulty walking[  ];  Psych:depression[  ]; anxiety[  ];  Endocrine: diabetes[Y  ];  thyroid dysfunction[  ];  Immunizations: Flu up to date [ Y ]; Pneumococcal up to date [name].[name]  ];  Other:  Physical Exam: BP 147/71 mmHg  Pulse 79  Resp 20  Ht 5\' 11"  (1.803 m)  Wt 253 lb (114.76 kg)  BMI 35.30 kg/m2  SpO2 98%  PHYSICAL EXAMINATION:  General appearance: alert, cooperative, appears stated age and no  distress Neurologic: intact Heart: regular rate and rhythm, S1, S2 normal, early systolic 2/6  murmur, click, rub or gallop Lungs: clear to auscultation bilaterally Abdomen: soft, non-tender; bowel sounds normal; no masses,  no organomegaly Extremities: extremities normal, atraumatic, no cyanosis or edema and Homans sign is negative, no sign of DVT No carotid bruits Full brachial radial, femoral pedal pulses No cervical axillary or supraclavicular adneopathy   Diagnostic Studies & Laboratory data:     Recent Radiology Findings:  CT chest abdomen 07/13/2014   CLINICAL DATA:  Dysphasia for several months.  Esophageal mass.  EXAM: CT CHEST, ABDOMEN, AND PELVIS WITH CONTRAST  TECHNIQUE: Multidetector CT imaging of the chest, abdomen and pelvis was performed following the standard protocol during bolus administration of intravenous contrast.  CONTRAST:  160mL OMNIPAQUE IOHEXOL 300 MG/ML  SOLN  COMPARISON:  None  FINDINGS: CT CHEST FINDINGS  Mediastinum: Normal heart size. No pericardial effusion. Previous median sternotomy and CABG procedure. Normal appearance of the trachea. The proximal and mid esophagus appears mildly dilated and patulous. Distal esophageal mass measures 3 x 2.0 x 4.2 cm. There is a an adjacent lymph node measuring 7 mm, image 42/series 2. 1 cm lymph node is identified between the esophagus and descending thoracic aorta at the level of the left mainstem bronchus. No right paratracheal or sub- carinal adenopathy identified. There is no hilar adenopathy.  Lungs/Pleura: No pleural effusion identified. There is no suspicious pulmonary nodule or mass.  Musculoskeletal: Mild thoracic spondylosis identified. No aggressive lytic or sclerotic bone lesions.  CT ABDOMEN AND PELVIS FINDINGS  Hepatobiliary: There is no focal liver abnormality. Gallstone measures 1.5 cm, image 61/series 2. No biliary dilatation.  Pancreas: Normal appearance of the pancreas.  Spleen: Negative.  Adrenals/Urinary Tract:  The adrenal glands are normal. Normal appearance of the right kidney. Left renal cysts identified. The urinary bladder appears normal  Stomach/Bowel: The stomach is normal. The small bowel loops have a normal course and caliber without obstruction. Normal appearance of the colon.  Vascular/Lymphatic: Calcified atherosclerotic disease involves the abdominal aorta. No aneurysm. Multiple prominent upper abdominal lymph nodes. Most of these measure less than 1 cm in short axis. Gastrohepatic ligament lymph node measures 1 cm, image 57/series 2. No retroperitoneal adenopathy. No pelvic or inguinal adenopathy.  Reproductive: Prostate gland appears mildly enlarged. Symmetric appearance of the seminal vesicles.  Other: There is no ascites or focal fluid collections within the abdomen or pelvis.  Musculoskeletal: Review of the visualized osseous structures is negative for aggressive lytic or sclerotic bone lesion. Degenerative disc disease is noted within the lower lumbar spine.  IMPRESSION: 1. Distal esophageal mass concerning for primary esophageal carcinoma. 2. Prominent posterior mediastinal lymph nodes measure up to 1 cm. Within the upper abdomen there are multiple small lymph nodes. The largest is in the gastrohepatic ligament region measuring 1 cm. Consider further evaluation with PET-CT. 3. Atherosclerotic  disease. 4. Gallstone.   Electronically Signed   By: Kerby Moors M.D.   On: 07/13/2014 15:39      Recent Lab Findings: Lab Results  Component Value Date   WBC 8.3 07/12/2014   HGB 13.3 07/12/2014   HCT 40.5 07/12/2014   PLT 204.0 07/12/2014   GLUCOSE 122* 07/12/2014   CHOL 120 10/27/2013   TRIG 246.0* 10/27/2013   HDL 33.90* 10/27/2013   LDLDIRECT 57.7 08/24/2011   LDLCALC 37 10/27/2013   ALT 23 07/12/2014   AST 24 07/12/2014   NA 140 07/12/2014   K 4.4 07/12/2014   CL 104 07/12/2014   CREATININE 1.3 07/12/2014   BUN 16 07/12/2014   CO2 27 07/12/2014   TSH 0.79 10/27/2013   HGBA1C 7.1*  04/28/2014     ESOPHAGUS: A half circumferential fungating mass was found in the distal esophagus at 38 cm extending to the GE junction at 43 cm. The lesion narrows the esophageal lumen, but the standard adult upper endoscope passes the tumor without difficulty. Multiple biopsies were performed using cold forceps. There appears to be circumferential Barrett's mucosa extending up to the top of the tumor. STOMACH: The mucosa of the stomach appeared normal with small hiatal hernia. DUODENUM: Mild duodenal inflammation was found in the duodenal bulb and sweep. The 2nd portion of the duodenum was normal. Retroflexed views revealed a hiatal hernia. The scope was then withdrawn from the patient and the procedure completed. COMPLICATIONS: There were no immediate complications. ENDOSCOPIC IMPRESSION: 1. 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 Small hiatal hernia 2. The mucosa of the stomach appeared normal 3. Mild peptic duodenitis was found in the duodenal bulb Assessment / Plan:    Path:   Diagnosis Esophagus, biopsy, distal - ADENOCARCINOMA, SEE COMMENT. Microscopic Comment The adenocarcinoma is arising in a background of high grade glandular dysplasia. The adenocarcinoma involves at least lamina propria. Although there is smooth muscle involvement by tumor, hypertrophic and redundant muscularis mucosa is known to occur in Barrett's esophagus. The case was reviewed with Dr. Saralyn Pilar who concurs. The case was discussed with Dr. Hilarie Fredrickson on 07/13/2014. 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 Will need cardiology clearance before surgical  Will discuss case with Dr Benay Spice and Sondra Come after work up complete and seen next week   I spent 60 minutes counseling the patient face to face. The total time spent in the appointment was 80 minutes.  Grace Isaac MD      [address] ,[place] [address] Office [phone]   Beeper [phone]  07/20/2014 8:43 PM

## 2014-07-23 ENCOUNTER — Encounter (HOSPITAL_COMMUNITY): Payer: Self-pay | Admitting: Gastroenterology

## 2014-07-26 ENCOUNTER — Encounter: Payer: Self-pay | Admitting: Radiation Oncology

## 2014-07-26 NOTE — Progress Notes (Signed)
GI Location of Tumor / Histology: esophageal cancer with a "half circumferential fungating mass was found in the distal esophagus at 38 cm extending to the GE junction at 43 cm."  Erven Colla presented with a 6 month history of solid food dysphagia. This is worse with foods such as breads and meats.   Biopsies revealed:   07/12/14 Esophagus, biopsy, distal - ADENOCARCINOMA, SEE COMMENT.  Past/Anticipated interventions by surgeon, if any: 07/12/14 - upper endoscopy, 07/22/14 - Procedure: UPPER ENDOSCOPIC ULTRASOUND (EUS) LINEAR;  Surgeon: Milus Banister, MD;  Location: [facility];  Service: Endoscopy;  Laterality: N/A; Per Dr. Servando Snare "pending final result of pet and EUS consider combined chemo/radiation therapy then restage and consider surgical resection."  Past/Anticipated interventions by medical oncology, if any: apt with Dr. Benay Spice 07/27/14  Weight changes, if any: has lost 10 lbs in last 2 weeks due to eating a liquid diet.    Bowel/Bladder complaints, if any: no  Nausea / Vomiting, if any: no  Pain issues, if any:  no  SAFETY ISSUES:  Prior radiation? no  Pacemaker/ICD? no  Possible current pregnancy? no  Is the patient on methotrexate? no  Current Complaints / other details:  PET scan scheduled for 07/30/14.  Patient is with his wife and his son.

## 2014-07-27 ENCOUNTER — Encounter: Payer: Self-pay | Admitting: Oncology

## 2014-07-27 ENCOUNTER — Ambulatory Visit
Admission: RE | Admit: 2014-07-27 | Discharge: 2014-07-27 | Disposition: A | Discharge status: Home / Self Care | Payer: BLUE CROSS/BLUE SHIELD | Source: Ambulatory Visit | Attending: Radiation Oncology | Admitting: Radiation Oncology

## 2014-07-27 ENCOUNTER — Encounter: Payer: Self-pay | Admitting: Radiation Oncology

## 2014-07-27 ENCOUNTER — Telehealth: Payer: Self-pay | Admitting: Oncology

## 2014-07-27 ENCOUNTER — Ambulatory Visit: Payer: BLUE CROSS/BLUE SHIELD

## 2014-07-27 ENCOUNTER — Ambulatory Visit (HOSPITAL_BASED_OUTPATIENT_CLINIC_OR_DEPARTMENT_OTHER): Payer: BLUE CROSS/BLUE SHIELD | Admitting: Oncology

## 2014-07-27 VITALS — BP 132/56 | HR 58 | Temp 97.5°F | Resp 18 | Ht 71.0 in | Wt 257.1 lb

## 2014-07-27 VITALS — BP 130/68 | HR 62 | Temp 97.8°F | Resp 16 | Ht 71.0 in | Wt 258.3 lb

## 2014-07-27 DIAGNOSIS — I10 Essential (primary) hypertension: Secondary | ICD-10-CM

## 2014-07-27 DIAGNOSIS — Z951 Presence of aortocoronary bypass graft: Secondary | ICD-10-CM | POA: Diagnosis not present

## 2014-07-27 DIAGNOSIS — E039 Hypothyroidism, unspecified: Secondary | ICD-10-CM | POA: Diagnosis not present

## 2014-07-27 DIAGNOSIS — C155 Malignant neoplasm of lower third of esophagus: Secondary | ICD-10-CM | POA: Diagnosis not present

## 2014-07-27 DIAGNOSIS — C159 Malignant neoplasm of esophagus, unspecified: Secondary | ICD-10-CM

## 2014-07-27 DIAGNOSIS — E785 Hyperlipidemia, unspecified: Secondary | ICD-10-CM | POA: Diagnosis not present

## 2014-07-27 DIAGNOSIS — I350 Nonrheumatic aortic (valve) stenosis: Secondary | ICD-10-CM

## 2014-07-27 DIAGNOSIS — I2510 Atherosclerotic heart disease of native coronary artery without angina pectoris: Secondary | ICD-10-CM

## 2014-07-27 DIAGNOSIS — Z7982 Long term (current) use of aspirin: Secondary | ICD-10-CM | POA: Insufficient documentation

## 2014-07-27 DIAGNOSIS — E119 Type 2 diabetes mellitus without complications: Secondary | ICD-10-CM

## 2014-07-27 DIAGNOSIS — Z87891 Personal history of nicotine dependence: Secondary | ICD-10-CM | POA: Insufficient documentation

## 2014-07-27 DIAGNOSIS — C160 Malignant neoplasm of cardia: Secondary | ICD-10-CM | POA: Insufficient documentation

## 2014-07-27 DIAGNOSIS — Z510 Encounter for antineoplastic radiation therapy: Secondary | ICD-10-CM | POA: Insufficient documentation

## 2014-07-27 DIAGNOSIS — R1310 Dysphagia, unspecified: Secondary | ICD-10-CM

## 2014-07-27 DIAGNOSIS — Z79899 Other long term (current) drug therapy: Secondary | ICD-10-CM | POA: Insufficient documentation

## 2014-07-27 HISTORY — DX: Malignant neoplasm of esophagus, unspecified: C15.9

## 2014-07-27 NOTE — Progress Notes (Signed)
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 The patient presented last year with solid food dysphagia. The patient was seen by Dr. Jarold Song and then referred to Dr. Hilarie Fredrickson. he proceeded to undergo upper endoscopy on January 4 which revealed a fungating partially circumferential mass found in the distal esophagus starting at 38 cm and extending to the GE junction at 43 cm. Biopsy of this area revealed adenocarcinoma. 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 he proceeded to undergo staging workup with CT scan of chest abdomen and pelvis revealing a distal esophageal mass with possibly surrounding lymphadenopathy. January 14 patient was taken to endoscopic ultrasound by Dr. Ardis Hughs. The patient was found to have a lesion which passed into and through the muscularis propria of the esophageal wall area and in addition there were 2 hypoechoic lymph nodes adjacent to the tumor suspicious for malignancy. Patient has been seen by thoracic surgery and is felt to be a potential candidate for tri- modality therapy. the patient will undergo a PET scan on January  22nd to complete his staging workup. Radiation therapy is been consulted for evaluation and consideration for treatment.  PREVIOUS RADIATION THERAPY: No  PAST MEDICAL HISTORY:  has a past medical history of HYPOTHYROIDISM (05/08/2010); DM (05/08/2010); HYPERLIPIDEMIA (05/08/2010); HYPERTENSION, BENIGN  (04/13/2008); CAD, NATIVE VESSEL (04/13/2008); Aortic stenosis; Gall bladder stones (07/20/2014); CABG (07/20/2014); and Esophageal cancer.    PAST SURGICAL HISTORY: Past Surgical History  Procedure Laterality Date  . Coronary artery bypass graft  2002  . Eus N/A 07/22/2014    Procedure: UPPER ENDOSCOPIC ULTRASOUND (EUS) LINEAR;  Surgeon: Milus Banister, MD;  Location: [facility];  Service: Endoscopy;  Laterality: N/A;  . Esophagogastroduodenoscopy endoscopy  07/12/14    FAMILY HISTORY: family history includes Alcohol abuse in his father; Colon cancer (age of onset: 52) in his father; Coronary artery disease in his brother and mother; Diabetes in his mother; Diabetes type II in his mother; Hypertension in his mother.  SOCIAL HISTORY:  reports that he quit smoking about 15 years ago. His smoking use included Cigarettes. He has a 30 pack-year smoking history. He quit smokeless tobacco use about 14 years ago. He reports that he drinks alcohol. He reports that he does not use illicit drugs. he is a retired Administrator  ALLERGIES: Review of patient's allergies indicates no known allergies.  MEDICATIONS:  Current Outpatient Prescriptions  Medication Sig Dispense Refill  . amLODipine-benazepril (LOTREL) 5-20 MG per capsule Take 1 capsule by mouth daily.  3  . aspirin 325 MG tablet Take 325 mg by mouth daily.    Marland Kitchen levothyroxine (SYNTHROID, LEVOTHROID) 200 MCG tablet Take 200 mcg by mouth daily.  3  . metFORMIN (GLUCOPHAGE) 500 MG tablet Take by mouth 2 (two) times daily with a meal.    . metoprolol (LOPRESSOR) 50 MG tablet     . Multiple Vitamin (MULTIVITAMIN) tablet Take 1 tablet by mouth daily.    . pantoprazole (PROTONIX) 40 MG tablet     .  valsartan-hydrochlorothiazide (DIOVAN-HCT) 80-12.5 MG per tablet Take 1 tablet by mouth daily.  1  . ONETOUCH VERIO test strip     . VYTORIN 10-40 MG per tablet      No current facility-administered medications for this encounter.    REVIEW OF  SYSTEMS:  A 15 point review of systems is documented in the electronic medical record. This was obtained by the nursing staff. However, I reviewed this with the patient to discuss relevant findings and make appropriate changes. He complains of food getting stuck in his lower chest. Occasionally will have some emesis related to this issue. He denies any problems with swallowing liquids. He denies any pain with this issue but has an uncomfortable feeling if food become dislodged. Patient's appetite is excellent. He is lost approximately 12-15 pounds over the past several weeks. His energy level is good   PHYSICAL EXAM:  height is 5\' 11"  (1.803 m) and weight is 258 lb 4.8 oz (117.164 kg). His oral temperature is 97.8 F (36.6 C). His blood pressure is 130/68 and his pulse is 62. His respiration is 16 and oxygen saturation is 99%.   BP 130/68 mmHg  Pulse 62  Temp(Src) 97.8 F (36.6 C) (Oral)  Resp 16  Ht 5\' 11"  (1.803 m)  Wt 258 lb 4.8 oz (117.164 kg)  BMI 36.04 kg/m2  SpO2 99%  General Appearance:    Alert, cooperative, no distress, appears stated age, accompanied by her son and wife on evaluation today   Head:    Normocephalic, without obvious abnormality, atraumatic  Eyes:    PERRL, conjunctiva/corneas clear, EOM's intact,           Ears:    Normal TM's and external ear canals, both ears  Nose:   Nares normal, septum midline, mucosa normal, no drainage    or sinus tenderness  Throat:   Lips, mucosa, and tongue normal; gums normal  Neck:   Supple, symmetrical, trachea midline, no adenopathy;       thyroid:  No enlargement/tenderness/nodules; no carotid   bruit or JVD  Back:     Symmetric, no curvature, ROM normal, no CVA tenderness  Lungs:     Clear to auscultation bilaterally, respirations unlabored  Chest wall:    No tenderness or deformity, vertical midline scar from prior CABG   Heart:    Regular rate and rhythm, S1 and S2 normal, no murmur, rub   or gallop  Abdomen:     Soft,  non-tender, bowel sounds active all four quadrants,    no masses, no organomegaly        Extremities:   Extremities normal, atraumatic, no cyanosis or edema  Pulses:   2+ and symmetric all extremities  Skin:   Skin color, texture, turgor normal, no rashes or lesions  Lymph nodes:   Cervical, supraclavicular, and axillary nodes normal  Neurologic:    Normal strength, sensation and reflexes      throughout     ECOG = 1    1 - Symptomatic but completely ambulatory (Restricted in physically strenuous activity but ambulatory and able to carry out work of a light or sedentary nature. For example, light housework, office work)  LABORATORY DATA:  Lab Results  Component Value Date   WBC 8.3 07/12/2014   HGB 13.3 07/12/2014   HCT 40.5 07/12/2014   MCV 88.6 07/12/2014   PLT 204.0 07/12/2014   NEUTROABS 5.9 07/12/2014   Lab Results  Component Value Date   NA 140 07/12/2014  K 4.4 07/12/2014   CL 104 07/12/2014   CO2 27 07/12/2014   GLUCOSE 122* 07/12/2014   CREATININE 1.3 07/12/2014   CALCIUM 9.6 07/12/2014      RADIOGRAPHY: Ct Chest W Contrast  07/13/2014   CLINICAL DATA:  Dysphasia for several months.  Esophageal mass.  EXAM: CT CHEST, ABDOMEN, AND PELVIS WITH CONTRAST  TECHNIQUE: Multidetector CT imaging of the chest, abdomen and pelvis was performed following the standard protocol during bolus administration of intravenous contrast.  CONTRAST:  111mL OMNIPAQUE IOHEXOL 300 MG/ML  SOLN  COMPARISON:  None  FINDINGS: CT CHEST FINDINGS  Mediastinum: Normal heart size. No pericardial effusion. Previous median sternotomy and CABG procedure. Normal appearance of the trachea. The proximal and mid esophagus appears mildly dilated and patulous. Distal esophageal mass measures 3 x 2.0 x 4.2 cm. There is a an adjacent lymph node measuring 7 mm, image 42/series 2. 1 cm lymph node is identified between the esophagus and descending thoracic aorta at the level of the left mainstem bronchus. No right  paratracheal or sub- carinal adenopathy identified. There is no hilar adenopathy.  Lungs/Pleura: No pleural effusion identified. There is no suspicious pulmonary nodule or mass.  Musculoskeletal: Mild thoracic spondylosis identified. No aggressive lytic or sclerotic bone lesions.  CT ABDOMEN AND PELVIS FINDINGS  Hepatobiliary: There is no focal liver abnormality. Gallstone measures 1.5 cm, image 61/series 2. No biliary dilatation.  Pancreas: Normal appearance of the pancreas.  Spleen: Negative.  Adrenals/Urinary Tract: The adrenal glands are normal. Normal appearance of the right kidney. Left renal cysts identified. The urinary bladder appears normal  Stomach/Bowel: The stomach is normal. The small bowel loops have a normal course and caliber without obstruction. Normal appearance of the colon.  Vascular/Lymphatic: Calcified atherosclerotic disease involves the abdominal aorta. No aneurysm. Multiple prominent upper abdominal lymph nodes. Most of these measure less than 1 cm in short axis. Gastrohepatic ligament lymph node measures 1 cm, image 57/series 2. No retroperitoneal adenopathy. No pelvic or inguinal adenopathy.  Reproductive: Prostate gland appears mildly enlarged. Symmetric appearance of the seminal vesicles.  Other: There is no ascites or focal fluid collections within the abdomen or pelvis.  Musculoskeletal: Review of the visualized osseous structures is negative for aggressive lytic or sclerotic bone lesion. Degenerative disc disease is noted within the lower lumbar spine.  IMPRESSION: 1. Distal esophageal mass concerning for primary esophageal carcinoma. 2. Prominent posterior mediastinal lymph nodes measure up to 1 cm. Within the upper abdomen there are multiple small lymph nodes. The largest is in the gastrohepatic ligament region measuring 1 cm. Consider further evaluation with PET-CT. 3. Atherosclerotic disease. 4. Gallstone.   Electronically Signed   By: Kerby Moors M.D.   On: 07/13/2014 15:39     Ct Abdomen Pelvis W Contrast  07/13/2014   CLINICAL DATA:  Dysphasia for several months.  Esophageal mass.  EXAM: CT CHEST, ABDOMEN, AND PELVIS WITH CONTRAST  TECHNIQUE: Multidetector CT imaging of the chest, abdomen and pelvis was performed following the standard protocol during bolus administration of intravenous contrast.  CONTRAST:  161mL OMNIPAQUE IOHEXOL 300 MG/ML  SOLN  COMPARISON:  None  FINDINGS: CT CHEST FINDINGS  Mediastinum: Normal heart size. No pericardial effusion. Previous median sternotomy and CABG procedure. Normal appearance of the trachea. The proximal and mid esophagus appears mildly dilated and patulous. Distal esophageal mass measures 3 x 2.0 x 4.2 cm. There is a an adjacent lymph node measuring 7 mm, image 42/series 2. 1 cm lymph node is identified between  the esophagus and descending thoracic aorta at the level of the left mainstem bronchus. No right paratracheal or sub- carinal adenopathy identified. There is no hilar adenopathy.  Lungs/Pleura: No pleural effusion identified. There is no suspicious pulmonary nodule or mass.  Musculoskeletal: Mild thoracic spondylosis identified. No aggressive lytic or sclerotic bone lesions.  CT ABDOMEN AND PELVIS FINDINGS  Hepatobiliary: There is no focal liver abnormality. Gallstone measures 1.5 cm, image 61/series 2. No biliary dilatation.  Pancreas: Normal appearance of the pancreas.  Spleen: Negative.  Adrenals/Urinary Tract: The adrenal glands are normal. Normal appearance of the right kidney. Left renal cysts identified. The urinary bladder appears normal  Stomach/Bowel: The stomach is normal. The small bowel loops have a normal course and caliber without obstruction. Normal appearance of the colon.  Vascular/Lymphatic: Calcified atherosclerotic disease involves the abdominal aorta. No aneurysm. Multiple prominent upper abdominal lymph nodes. Most of these measure less than 1 cm in short axis. Gastrohepatic ligament lymph node measures 1 cm,  image 57/series 2. No retroperitoneal adenopathy. No pelvic or inguinal adenopathy.  Reproductive: Prostate gland appears mildly enlarged. Symmetric appearance of the seminal vesicles.  Other: There is no ascites or focal fluid collections within the abdomen or pelvis.  Musculoskeletal: Review of the visualized osseous structures is negative for aggressive lytic or sclerotic bone lesion. Degenerative disc disease is noted within the lower lumbar spine.  IMPRESSION: 1. Distal esophageal mass concerning for primary esophageal carcinoma. 2. Prominent posterior mediastinal lymph nodes measure up to 1 cm. Within the upper abdomen there are multiple small lymph nodes. The largest is in the gastrohepatic ligament region measuring 1 cm. Consider further evaluation with PET-CT. 3. Atherosclerotic disease. 4. Gallstone.   Electronically Signed   By: Kerby Moors M.D.   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 The patient would appear to be a good candidate for radiation along with radiosensitizing chemotherapy in a preoperative setting assuming he has potentially resectable esophageal cancer ( PET scan pending).  I discussed treatment course side effects and potential toxicities of radiation therapy in this situation with the patient and his family. He appears to understand and wishes to proceed with planned course of treatment.  PLAN: Simulation and planning January 26 after completion of the patient's PET scan. Assuming he has no distant metastasis the patient will proceed with 5-1/2 weeks of radiation therapy in a preoperative setting. If patient is found to have stage IV disease on PET scan,  I would still recommend radiation therapy as a palliative approach.   I spent 60 minutes minutes face to face with the patient and more than 50% of that time was spent in counseling and/or coordination of care.    ------------------------------------------------  Blair Promise, PhD, MD

## 2014-07-27 NOTE — Progress Notes (Signed)
Please see the Nurse Progress Note in the MD Initial Consult Encounter for this patient. 

## 2014-07-27 NOTE — Progress Notes (Signed)
Checked in new pt with no financial concerns prior to seeing the dr.  Informed pt that if chemo is part of his treatment Raquel will call his ins to see if Josem Kaufmann is req and will obtain that if it is as well as contact foundations that offer copay assistance for chemo if needed.  He has Raquel's card for any billing or insurance questions or concerns.

## 2014-07-27 NOTE — Progress Notes (Signed)
Provided patient with new patient packet with info on esophageal cancer, support group,office contact information, safety/fall precautions. Accompanied by his wife and son, Shanon Brow today. Wants to keep his Bernie, but asking if his radiation could be done at [place].

## 2014-07-27 NOTE — Progress Notes (Signed)
[place] Patient Consult   Referring MD: Yasmin Dibello [facility] 74 y.o.  01-10-1941    Reason for Referral: Esophagus cancer   HPI: He reports a history of solid dysphagia beginning in August 2015. He saw Dr. Elease Hashimoto and was referred to Dr. Hilarie Fredrickson. He was taken to an upper endoscopy on 07/12/2014. A half circumferential fungating mass was found in the distal esophagus at 38 cm extending to the GE junction at 43 cm. The lesion was passed with the adult endoscope. Multiple biopsies were obtained. There was evidence of Barrett's esophagus extending to the top of the tumor. The pathology ([id number]) revealed adenocarcinoma. CTs of the chest, abdomen, and pelvis on 07/13/2014 revealed a mildly dilated esophagus. A distal esophageal mass was noted with an adjacent lymph node measuring 7 mm. A 1 cm lymph node was identified between the esophagus and descending aorta at the level of the left mainstem bronchus. No suspicious pulmonary nodule or mass.no focal liver abdomen no focal liver abnormality. Multiple prominent upper abdominal nodes including a 1 cm gastrohepatic ligament node.  He was taken to an endoscopic ultrasound by Dr. Ardis Hughs 07/22/2014. A non-circumferential tumor was found in the distal esophagus ending at the GE junction. The mass clearly passed into and through the muscular propria of the esophageal wall and was staged as auT3uN1 lesion. 2 hypoechoic nodes were adjacent to the proximal edge of the tumor suspicious for malignancy.  He saw Dr.Kinard earlier today and is being scheduled for radiation simulation next week. He saw Dr. Servando Snare To discuss surgical options.  [name] is scheduled for a PET scan later this week.  He has no complaint other than solid dysphagia.   Past Medical History  Diagnosis Date  . HYPOTHYROIDISM 05/08/2010  . DM 05/08/2010  . HYPERLIPIDEMIA 05/08/2010  . HYPERTENSION, BENIGN 04/13/2008  . CAD, NATIVE VESSEL 04/13/2008    . Aortic stenosis   . Gall bladder stones 07/20/2014  . Hx of CABG     CAD with history of coronary artery bypass graft 2002   . Esophageal cancer-distal esophagus mass (uT3,uN1} (  January 2016     Past Surgical History  Procedure Laterality Date  . Coronary artery bypass graft  2002  . Eus N/A 07/22/2014    Procedure: UPPER ENDOSCOPIC ULTRASOUND (EUS) LINEAR;  Surgeon: Milus Banister, MD;  Location: [facility];  Service: Endoscopy;  Laterality: N/A;  . Esophagogastroduodenoscopy endoscopy  07/12/14    Medications: Reviewed  Allergies: No Known Allergies  Family history: His brother had head and neck cancer at age 68. His father had colon cancer in his 40s. No other family history of cancer  Social History:   He lives in [place]. He quit smoking cigarettes in 2002. He drinks alcohol on rare occasion. No transfusion history. No risk factor for HIV or hepatitis. He was in [employer] in the 1960s. He is a retired Administrator.    ROS:   Positives include:Solid dysphagia beginning in August 2015  A complete ROS was otherwise negative.  Physical Exam:  Blood pressure 132/56, pulse 58, temperature 97.5 F (36.4 C), temperature source Oral, resp. rate 18, height 5\' 11"  (1.803 m), weight 257 lb 1.6 oz (116.62 kg).  HEENT: Oropharynx without visible mass, neck without mass  Lungs: Lungs clear bilaterally  Cardiac: Regular rate and rhythm, 2/6 systolic murmur  Abdomen: No hepatomegaly, nontender, no mass  GU: Testes without mass  Vascular: No leg edema  Lymph nodes:  No cervical, supraclavicular, axillary, or inguinal nodes  Neurologic: Alert and oriented, the motor exam appears intact in the upper and lower extremities  Skin: Yeast rash in the groin bilaterally  Musculoskeletal: No spine tenderness  LAB:  CBC  Lab Results  Component Value Date   WBC 8.3 07/12/2014   HGB 13.3 07/12/2014   HCT 40.5 07/12/2014   MCV 88.6 07/12/2014   PLT 204.0 07/12/2014    NEUTROABS 5.9 07/12/2014     CMP      Component Value Date/Time   NA 140 07/12/2014 1019   K 4.4 07/12/2014 1019   CL 104 07/12/2014 1019   CO2 27 07/12/2014 1019   GLUCOSE 122* 07/12/2014 1019   BUN 16 07/12/2014 1019   CREATININE 1.3 07/12/2014 1019   CALCIUM 9.6 07/12/2014 1019   PROT 7.1 07/12/2014 1019   ALBUMIN 4.0 07/12/2014 1019   AST 24 07/12/2014 1019   ALT 23 07/12/2014 1019   ALKPHOS 47 07/12/2014 1019   BILITOT 0.8 07/12/2014 1019   GFRNONAA 67.39 05/15/2010 0844    Imaging:   07/13/2014 CT images reviewed    Assessment/Plan:   1.  Adenocarcinoma of the distal esophagus/GE junction, status post an endoscopic biopsy 07/12/2014   EUS 07/22/2014 confirmed a  uT3,uN1 tumor   Staging CTs of the chest, abdomen, and pelvis on 07/13/2014 revealed a distal esophageal mass, prominent posterior mediastinal lymph nodes, and a 1 cm gastrohepatic ligament node   2.  Solid dysphagia secondary to #1   3.  History of coronary artery disease, status post coronary artery bypass surgery in 2002   4.  Diabetes   5.  Hypertension   6.  Hyperlipidemia   7.  Aortic stenosis  Disposition:   [name] has been diagnosed with adenocarcinoma the distal esophagus/GE junction. I discussed treatment options with him today. I explained the trimodality therapy approach generally used in patients with potentially resectable esophagus cancer. I recommend neoadjuvant chemotherapy/radiation to be followed by restaging and surgery as indicated.  He will undergo a staging PET scan later this week. If there is no evidence of distant metastatic disease the plan is to begin concurrent weekly Taxol/carboplatin and daily radiation during the week of 08/09/2014.  We reviewed the potential toxicities associated with the Taxol/carboplatin regimen including the chance for nausea/vomiting, mucositis, diarrhea, alopecia, and hematologic toxicity. We discussed the neuropathy, bone pain, and  allergic reaction associated with Taxol. We discussed the potential for an allergic reaction with carboplatin. He understands the likelihood of developing esophagitis. He agrees to proceed. He will attend a chemotherapy teaching class.  We will check a chemistry panel and CEA when he returns for the chemotherapy teaching class on 07/30/2014. He will be scheduled to see the Cancer center nutritionist.  A first cycle of Taxol/carboplatin is scheduled for 08/10/2014.  Approximately 50 minutes were spent with the patient today. The majority of the time was used for counseling and coordination of care. [name], [name] 07/27/2014, 5:48 PM

## 2014-07-27 NOTE — Telephone Encounter (Signed)
Pt confirmed labs/ov/chemo edu class/nut per 01/19 POF, gave pt AVS.... KJ, sent msg to add chemo

## 2014-07-28 ENCOUNTER — Encounter: Payer: Self-pay | Admitting: *Deleted

## 2014-07-28 ENCOUNTER — Telehealth: Payer: Self-pay | Admitting: *Deleted

## 2014-07-28 ENCOUNTER — Other Ambulatory Visit: Payer: Self-pay | Admitting: *Deleted

## 2014-07-28 MED ORDER — PROCHLORPERAZINE MALEATE 10 MG PO TABS: 10.0000 mg | ORAL_TABLET | Freq: Four times a day (QID) | ORAL | Status: DC | PRN

## 2014-07-28 MED ORDER — DEXAMETHASONE 4 MG PO TABS: 10.0000 mg | ORAL_TABLET | Freq: Every day | ORAL | Status: DC

## 2014-07-28 NOTE — Telephone Encounter (Signed)
No additional note

## 2014-07-29 ENCOUNTER — Telehealth: Payer: Self-pay | Admitting: *Deleted

## 2014-07-29 ENCOUNTER — Other Ambulatory Visit: Payer: BLUE CROSS/BLUE SHIELD

## 2014-07-29 ENCOUNTER — Encounter: Payer: Self-pay | Admitting: *Deleted

## 2014-07-29 NOTE — Telephone Encounter (Signed)
Per staff message and POF I have scheduled appts. Advised scheduler of appts. JMW  

## 2014-07-29 NOTE — Progress Notes (Signed)
Per Dr. Benay Spice : [name] needs to check his CBG's at least daily for the 1st 3 days after his chemo due to his diabetes and getting steroids for premeds. Needs to call for blood sugar > 400. Will have education nurse notify patient of this during chemo class.

## 2014-07-30 ENCOUNTER — Other Ambulatory Visit: Payer: BLUE CROSS/BLUE SHIELD

## 2014-07-30 ENCOUNTER — Ambulatory Visit (HOSPITAL_COMMUNITY): Admission: RE | Admit: 2014-07-30 | Payer: BLUE CROSS/BLUE SHIELD | Source: Ambulatory Visit

## 2014-08-02 ENCOUNTER — Encounter: Payer: Self-pay | Admitting: Family Medicine

## 2014-08-03 ENCOUNTER — Ambulatory Visit
Admission: RE | Admit: 2014-08-03 | Discharge: 2014-08-03 | Disposition: A | Discharge status: Home / Self Care | Payer: BLUE CROSS/BLUE SHIELD | Source: Ambulatory Visit | Attending: Radiation Oncology | Admitting: Radiation Oncology

## 2014-08-03 ENCOUNTER — Other Ambulatory Visit (HOSPITAL_BASED_OUTPATIENT_CLINIC_OR_DEPARTMENT_OTHER): Payer: BLUE CROSS/BLUE SHIELD

## 2014-08-03 DIAGNOSIS — C159 Malignant neoplasm of esophagus, unspecified: Secondary | ICD-10-CM

## 2014-08-03 DIAGNOSIS — C155 Malignant neoplasm of lower third of esophagus: Secondary | ICD-10-CM

## 2014-08-03 LAB — COMPREHENSIVE METABOLIC PANEL (CC13)
ALBUMIN: 4 g/dL (ref 3.5–5.0)
ALK PHOS: 57 U/L (ref 40–150)
ALT: 23 U/L (ref 0–55)
AST: 20 U/L (ref 5–34)
Anion Gap: 8 mEq/L (ref 3–11)
BUN: 16.3 mg/dL (ref 7.0–26.0)
CALCIUM: 9.5 mg/dL (ref 8.4–10.4)
CO2: 28 mEq/L (ref 22–29)
Chloride: 107 mEq/L (ref 98–109)
Creatinine: 1.3 mg/dL (ref 0.7–1.3)
EGFR: 53 mL/min/{1.73_m2} — AB (ref >90)
Glucose: 119 mg/dl (ref 70–140)
Potassium: 4.8 mEq/L (ref 3.5–5.1)
Sodium: 143 mEq/L (ref 136–145)
Total Bilirubin: 0.75 mg/dL (ref 0.20–1.20)
Total Protein: 7.2 g/dL (ref 6.4–8.3)

## 2014-08-03 LAB — CEA: CEA: 1.3 ng/mL (ref 0.0–5.0)

## 2014-08-04 NOTE — Progress Notes (Signed)
  Radiation Oncology         (336) [phone] ________________________________  Name: [name] MRN: [id number]  Date: 08/03/2014  DOB: 1940/12/02  SIMULATION AND TREATMENT PLANNING NOTE    ICD-9-CM ICD-10-CM   1. 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  Identity was confirmed.  All relevant records and images related to the planned course of therapy were reviewed.  The patient freely provided informed written consent to proceed with treatment after reviewing the details related to the planned course of therapy. The consent form was witnessed and verified by the simulation staff.  Then, the patient was set-up in a stable reproducible  supine position for radiation therapy.  CT images were obtained.  Surface markings were placed.  The CT images were loaded into the planning software.  Then the target and avoidance structures were contoured.  Treatment planning then occurred.  The radiation prescription was entered and confirmed.  Then, I designed and supervised the construction of a total of 0 medically necessary complex treatment devices.  I have requested : Intensity Modulated Radiotherapy (IMRT) is medically necessary for this case for the following reason:  Avoidance of heart and spinal cord.  I have ordered:dose calc.  PLAN:  The patient will receive 50.4 Gy in 28 fractions along with radiosensitizing chemotherapy.  ________________________________  Special treatment procedure note  The patient will be receiving radiosensitizing chemotherapy throughout his course of radiation treatment. Given the increased potential for toxicities as well as the necessity for close monitoring of the patient and bloodwork, this constitutes a special treatment procedure -----------------------------------  Blair Promise, PhD, MD

## 2014-08-05 DIAGNOSIS — Z510 Encounter for antineoplastic radiation therapy: Secondary | ICD-10-CM | POA: Diagnosis not present

## 2014-08-06 ENCOUNTER — Encounter: Payer: Self-pay | Admitting: *Deleted

## 2014-08-06 NOTE — Progress Notes (Signed)
[facility] Psychosocial Distress Screening Clinical Social Work  Clinical Social Work was referred by distress screening protocol.  The patient scored a 7 on the Psychosocial Distress Thermometer which indicates severe distress. Clinical Social Worker phoned pt to assess for distress and other psychosocial needs. Pt shared he is stilling waiting for his treatment plan to come together, but he is well aware of the resources at [facility]. CSW reviewed these with him. He shared he feels he is adjusting well to his illness, he "just wants a plan". Pt aware of how to reach CSW and Pt and Family Support Team as needed.   ONCBCN DISTRESS SCREENING 07/27/2014  Screening Type Initial Screening  Distress experienced in past week (1-10) 7    Clinical Social Worker follow up needed: No.  If yes, follow up plan: Loren Racer, [place]  [facility] Phone: [phone] Fax: [phone]

## 2014-08-07 ENCOUNTER — Encounter (HOSPITAL_COMMUNITY)
Admission: RE | Admit: 2014-08-07 | Discharge: 2014-08-07 | Disposition: A | Discharge status: Home / Self Care | Payer: BLUE CROSS/BLUE SHIELD | Source: Ambulatory Visit | Attending: Cardiothoracic Surgery | Admitting: Cardiothoracic Surgery

## 2014-08-07 DIAGNOSIS — C159 Malignant neoplasm of esophagus, unspecified: Secondary | ICD-10-CM | POA: Insufficient documentation

## 2014-08-07 LAB — GLUCOSE, CAPILLARY: Glucose-Capillary: 105 mg/dL — ABNORMAL HIGH (ref 70–99)

## 2014-08-07 MED ORDER — FLUDEOXYGLUCOSE F - 18 (FDG) INJECTION
13.7000 | Freq: Once | INTRAVENOUS | Status: AC | PRN
  Administered 2014-08-07: 13.7 via INTRAVENOUS

## 2014-08-08 ENCOUNTER — Other Ambulatory Visit: Payer: Self-pay | Admitting: Oncology

## 2014-08-09 DIAGNOSIS — Z510 Encounter for antineoplastic radiation therapy: Secondary | ICD-10-CM | POA: Diagnosis not present

## 2014-08-10 ENCOUNTER — Encounter: Payer: Self-pay | Admitting: Radiation Oncology

## 2014-08-10 ENCOUNTER — Ambulatory Visit
Admission: RE | Admit: 2014-08-10 | Discharge: 2014-08-10 | Disposition: A | Discharge status: Home / Self Care | Payer: BLUE CROSS/BLUE SHIELD | Source: Ambulatory Visit | Attending: Radiation Oncology | Admitting: Radiation Oncology

## 2014-08-10 ENCOUNTER — Ambulatory Visit: Payer: BLUE CROSS/BLUE SHIELD | Admitting: Nutrition

## 2014-08-10 ENCOUNTER — Ambulatory Visit: Payer: BLUE CROSS/BLUE SHIELD | Admitting: Radiation Oncology

## 2014-08-10 ENCOUNTER — Ambulatory Visit (HOSPITAL_BASED_OUTPATIENT_CLINIC_OR_DEPARTMENT_OTHER): Payer: BLUE CROSS/BLUE SHIELD

## 2014-08-10 VITALS — BP 161/66 | HR 81 | Temp 97.8°F | Resp 20 | Wt 260.7 lb

## 2014-08-10 DIAGNOSIS — C159 Malignant neoplasm of esophagus, unspecified: Secondary | ICD-10-CM

## 2014-08-10 DIAGNOSIS — Z5111 Encounter for antineoplastic chemotherapy: Secondary | ICD-10-CM

## 2014-08-10 DIAGNOSIS — C155 Malignant neoplasm of lower third of esophagus: Secondary | ICD-10-CM

## 2014-08-10 DIAGNOSIS — Z510 Encounter for antineoplastic radiation therapy: Secondary | ICD-10-CM | POA: Diagnosis not present

## 2014-08-10 MED ORDER — DIPHENHYDRAMINE HCL 50 MG/ML IJ SOLN
50.0000 mg | Freq: Once | INTRAMUSCULAR | Status: AC
Start: 2014-08-10 — End: 2014-08-10
  Administered 2014-08-10: 50 mg via INTRAVENOUS

## 2014-08-10 MED ORDER — DEXAMETHASONE SODIUM PHOSPHATE 20 MG/5ML IJ SOLN
20.0000 mg | Freq: Once | INTRAMUSCULAR | Status: AC
  Administered 2014-08-10: 20 mg via INTRAVENOUS

## 2014-08-10 MED ORDER — FAMOTIDINE IN NACL 20-0.9 MG/50ML-% IV SOLN
INTRAVENOUS | Status: AC
  Filled 2014-08-10: qty 50

## 2014-08-10 MED ORDER — FAMOTIDINE IN NACL 20-0.9 MG/50ML-% IV SOLN
20.0000 mg | Freq: Once | INTRAVENOUS | Status: AC
  Administered 2014-08-10: 20 mg via INTRAVENOUS

## 2014-08-10 MED ORDER — DIPHENHYDRAMINE HCL 50 MG/ML IJ SOLN
INTRAMUSCULAR | Status: AC
  Filled 2014-08-10: qty 1

## 2014-08-10 MED ORDER — PACLITAXEL CHEMO INJECTION 300 MG/50ML
45.0000 mg/m2 | Freq: Once | INTRAVENOUS | Status: AC
  Administered 2014-08-10: 108 mg via INTRAVENOUS
  Filled 2014-08-10: qty 18

## 2014-08-10 MED ORDER — ONDANSETRON 16 MG/50ML IVPB (CHCC)
16.0000 mg | Freq: Once | INTRAVENOUS | Status: AC
  Administered 2014-08-10: 16 mg via INTRAVENOUS

## 2014-08-10 MED ORDER — DEXAMETHASONE SODIUM PHOSPHATE 20 MG/5ML IJ SOLN
INTRAMUSCULAR | Status: AC
  Filled 2014-08-10: qty 5

## 2014-08-10 MED ORDER — ONDANSETRON 16 MG/50ML IVPB (CHCC)
INTRAVENOUS | Status: AC
  Filled 2014-08-10: qty 16

## 2014-08-10 MED ORDER — SODIUM CHLORIDE 0.9 % IV SOLN
Freq: Once | INTRAVENOUS | Status: AC
  Administered 2014-08-10: 09:00:00 via INTRAVENOUS

## 2014-08-10 MED ORDER — CARBOPLATIN CHEMO INJECTION 450 MG/45ML
222.0000 mg | Freq: Once | INTRAVENOUS | Status: AC
  Administered 2014-08-10: 220 mg via INTRAVENOUS
  Filled 2014-08-10: qty 22

## 2014-08-10 NOTE — Progress Notes (Signed)
Patient denies pain, fatigue, loss of appetite, difficulty eating/swallowing. He saw nutritionist today and will be followed by [facility] weekly.  Patient education completed with patient and spouse. Gave pt "Radiation and You" booklet with all pertinent information marked and discussed, re: fatigue, skin darkening, throat irritation/management, weight loss/nutrition, possible need for IVF, pain. Teach back method used.  Pt and wife verbalized understanding.

## 2014-08-10 NOTE — Patient Instructions (Signed)
[facility] Discharge Instructions for Patients Receiving Chemotherapy  Today you received the following chemotherapy agents Taxol/Carboplatin  To help prevent nausea and vomiting after your treatment, we encourage you to take your nausea medication as directed.   If you develop nausea and vomiting that is not controlled by your nausea medication, call the clinic.   BELOW ARE SYMPTOMS THAT SHOULD BE REPORTED IMMEDIATELY:  *FEVER GREATER THAN 100.5 F  *CHILLS WITH OR WITHOUT FEVER  NAUSEA AND VOMITING THAT IS NOT CONTROLLED WITH YOUR NAUSEA MEDICATION  *UNUSUAL SHORTNESS OF BREATH  *UNUSUAL BRUISING OR BLEEDING  TENDERNESS IN MOUTH AND THROAT WITH OR WITHOUT PRESENCE OF ULCERS  *URINARY PROBLEMS  *BOWEL PROBLEMS  UNUSUAL RASH Items with * indicate a potential emergency and should be followed up as soon as possible.  Feel free to call the clinic you have any questions or concerns. The clinic phone number is [phone].    

## 2014-08-10 NOTE — Progress Notes (Signed)
  Radiation Oncology         [phone] ________________________________  Name: [name] MRN: [id number]  Date: 08/10/2014  DOB: 06-Jul-1941  Simulation Verification Note    ICD-9-CM ICD-10-CM   1. Esophagus cancer 150.9 C15.9     Status: outpatient  NARRATIVE: The patient was brought to the treatment unit and placed in the planned treatment position. The clinical setup was verified. Then port films were obtained and uploaded to the radiation oncology medical record software.  The treatment beams were carefully compared against the planned radiation fields. The position location and shape of the radiation fields was reviewed. They targeted volume of tissue appears to be appropriately covered by the radiation beams. Organs at risk appear to be excluded as planned.  Based on my personal review, I approved the simulation verification. The patient's treatment will proceed as planned.  -----------------------------------  Blair Promise, PhD, MD

## 2014-08-10 NOTE — Progress Notes (Signed)
  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 . . . . . . . The patient presents for routine under treatment assessment.                                   The patient is without complaint. He is relieved to hear the results on his PET scan showing no distant metastasis.  He did start chemotherapy this week and so far is tolerating this well.                                 Set-up films were reviewed.                                 The chart was checked. Physical Findings. . .  weight is 260 lb 11.2 oz (118.253 kg). His oral temperature is 97.8 F (36.6 C). His blood pressure is 161/66 and his pulse is 81. His respiration is 20. . The lungs are clear. The heart has a regular rhythm and rate. The abdomen is soft and nontender with normal bowel sounds. Impression . . . . . . . The patient is tolerating radiation. Plan . . . . . . . . . . . . Continue treatment as planned.  ________________________________   Blair Promise, PhD, MD

## 2014-08-10 NOTE — Progress Notes (Signed)
74 year old male diagnosed with cancer of the distal esophagus.  He is a patient of Dr. Benay Spice.  Past medical history includes hypothyroidism, diabetes, hyperlipidemia, hypertension, CAD, CABG, tobacco, alcohol usage.  Medications include Decadron, Synthroid, Glucophage, multivitamin, Protonix, Compazine.  Labs were reviewed.  Height: 5 feet 11 inches. Weight: 258.3 pounds January 19. Usual body weight: 277 pounds August 2015. BMI: 36.04.  Patient has history of solid food dysphasia.   Patient endorses weight loss secondary to change in food textures. Patient states he has a potential for surgical resection after chemotherapy and radiation treatment.  Nutrition diagnosis: Unintended weight loss related to new diagnosis of esophageal cancer as evidenced by 7% weight loss in 6 months.  Intervention: Patient and wife educated on the importance of small frequent meals and snacks containing high-protein foods. Provided basic diabetic diet information. Educated patient on strategies for easier swallowing. Recommended patient add oral nutrition supplement if he is unable to eat meals and snacks. Provided fact sheets and my contact information. Questions were answered.  Teach back method used.  Monitoring, evaluation, goals: Patient will work to increase calories and protein to promote maintenance of lean body mass.  Next visit: Tuesday, February 9, during chemotherapy.  **Disclaimer: This note was dictated with voice recognition software. Similar sounding words can inadvertently be transcribed and this note may contain transcription errors which may not have been corrected upon publication of note.**

## 2014-08-11 ENCOUNTER — Ambulatory Visit: Payer: BLUE CROSS/BLUE SHIELD

## 2014-08-11 ENCOUNTER — Ambulatory Visit
Admission: RE | Admit: 2014-08-11 | Discharge: 2014-08-11 | Disposition: A | Discharge status: Home / Self Care | Payer: BLUE CROSS/BLUE SHIELD | Source: Ambulatory Visit | Attending: Radiation Oncology | Admitting: Radiation Oncology

## 2014-08-11 DIAGNOSIS — Z510 Encounter for antineoplastic radiation therapy: Secondary | ICD-10-CM | POA: Diagnosis not present

## 2014-08-12 ENCOUNTER — Encounter: Payer: Self-pay | Admitting: Cardiothoracic Surgery

## 2014-08-12 ENCOUNTER — Ambulatory Visit (INDEPENDENT_AMBULATORY_CARE_PROVIDER_SITE_OTHER): Payer: BLUE CROSS/BLUE SHIELD | Admitting: Cardiothoracic Surgery

## 2014-08-12 ENCOUNTER — Ambulatory Visit
Admission: RE | Admit: 2014-08-12 | Discharge: 2014-08-12 | Disposition: A | Discharge status: Home / Self Care | Payer: BLUE CROSS/BLUE SHIELD | Source: Ambulatory Visit | Attending: Radiation Oncology | Admitting: Radiation Oncology

## 2014-08-12 ENCOUNTER — Ambulatory Visit: Payer: BLUE CROSS/BLUE SHIELD

## 2014-08-12 VITALS — BP 137/66 | HR 76 | Resp 16 | Ht 71.0 in | Wt 257.0 lb

## 2014-08-12 DIAGNOSIS — C159 Malignant neoplasm of esophagus, unspecified: Secondary | ICD-10-CM

## 2014-08-12 DIAGNOSIS — Z510 Encounter for antineoplastic radiation therapy: Secondary | ICD-10-CM | POA: Diagnosis not present

## 2014-08-12 NOTE — Progress Notes (Signed)
[address]       [place] [address]             [id number]                    [name] Dean Medical Record #[id number] Date of Birth: 01-May-1941  Referring: Jerene Bears, MD Primary Care: Eulas Post, MD  Chief Complaint:    Chief Complaint  Patient presents with  . Esophageal Cancer    sugical eval, Chest/ABD/Pelvis CT 07/13/14, upper endoscopy 07/12/2014    History of Present Illness:    [name] 74 y.o. 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 The pateint had recent endoscopy because 4-5 months of increasing epigastric pain and trouble swallowing., no blood in stool. Still taking solid food . Since last seen a PET scan and esophageal ultrasound has been performed. Patient has been seen by medical and radiation oncology and started on  chemotherapy on a weekly basis and radiation which will complete March 10.    Current Activity/ Functional Status:  Patient is independent with mobility/ambulation, transfers, ADL's, IADL's.   Zubrod Score: At the time of surgery this patient's most appropriate activity status/level should be described as: [x]     0    Normal activity, no symptoms []     1    Restricted in physical strenuous activity but ambulatory, able to do out light work []     2    Ambulatory and capable of self care, unable to do work activities, up and about               >50 % of waking hours                              []     3    Only limited self care, in bed greater than 50% of waking hours []     4    Completely disabled, no self care, confined to bed or chair []     5    Moribund   Past Medical History  Diagnosis Date  . HYPOTHYROIDISM 05/08/2010  . DM 05/08/2010  . HYPERLIPIDEMIA 05/08/2010  . HYPERTENSION, BENIGN 04/13/2008  . CAD, NATIVE VESSEL 04/13/2008  . Aortic stenosis   . Gall bladder stones 07/20/2014  . 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 Esophageal cancer     Past Surgical History  Procedure Laterality Date  . Coronary artery bypass graft  2002  . Eus N/A 07/22/2014    Procedure: UPPER ENDOSCOPIC ULTRASOUND (EUS) LINEAR;  Surgeon: Milus Banister, MD;  Location: [facility];  Service: Endoscopy;  Laterality: N/A;  . Esophagogastroduodenoscopy endoscopy  07/12/14  CABG by Dr Cyndia Bent in 2002  Family History  Problem Relation Age of Onset  . Coronary artery disease Mother   . Diabetes type II Mother     diabetes type ll  . Hypertension Mother   . Diabetes Mother     type ll  . Coronary artery disease Brother   . Alcohol abuse Father   . Colon cancer Father 70   Father  Died 86  With colon cancer, mother died 23  With RA 5 weeks after cabg never was discharged home 2 sons healthy  History   Social History  . Marital Status: Married    Spouse Name: N/A    Number of Children: N/A  . Years of Education: N/A   Occupational History  . Retired English as a second language teacher    Social History Main Topics  . Smoking status: Former Smoker -- 1.00 packs/day for 30 years    Types: Cigarettes    Quit date: 07/30/1999  . Smokeless tobacco: Former Systems developer    Quit date: 07/18/2000  . Alcohol Use: 0.0 oz/week    0 Not specified per week     Comment: rare  . Drug Use: No  . Sexual Activity: Not on file     Comment: regular exercise      History  Smoking status  . Former Smoker -- 1.00 packs/day for 30 years  . Types: Cigarettes  . Quit date: 07/30/1999  Smokeless tobacco  . Former Systems developer  . Quit date: 07/18/2000    History  Alcohol Use  . 0.0 oz/week  . 0 Not specified per week    Comment: rare - used to drink daily before 2001     No Known Allergies  Current Outpatient Prescriptions  Medication Sig Dispense Refill  . amLODipine-benazepril (LOTREL) 5-20 MG per capsule Take 1 capsule by mouth daily.  3  . aspirin 325 MG tablet Take 325 mg by mouth daily.    Marland Kitchen dexamethasone  (DECADRON) 4 MG tablet Take 2.5 tablets (10 mg total) by mouth at bedtime. Take 2.5 tablets at bedtime night before 1st chemo, and repeat at 6am day of 1st chemo. 5 tablet 0  . levothyroxine (SYNTHROID, LEVOTHROID) 200 MCG tablet Take 200 mcg by mouth daily.  3  . metFORMIN (GLUCOPHAGE) 500 MG tablet Take by mouth 2 (two) times daily with a meal.    . metoprolol (LOPRESSOR) 50 MG tablet     . Multiple Vitamin (MULTIVITAMIN) tablet Take 1 tablet by mouth daily.    Glory Rosebush VERIO test strip     . pantoprazole (PROTONIX) 40 MG tablet     . prochlorperazine (COMPAZINE) 10 MG tablet Take 1 tablet (10 mg total) by mouth every 6 (six) hours as needed for nausea or vomiting. 30 tablet 0  . valsartan-hydrochlorothiazide (DIOVAN-HCT) 80-12.5 MG per tablet Take 1 tablet by mouth daily.  1  . VYTORIN 10-40 MG per tablet      No current facility-administered medications for this visit.     Review of Systems:     Cardiac Review of Systems: Y or N  Chest Pain [ N   ]  Resting SOB [ N  ] Exertional SOB  [ N ]  Orthopnea [ N ]   Pedal Edema Aqua.Slicker   ]    Palpitations Aqua.Slicker  ] Syncope  [  ]   Presyncope [   ]  General Review of Systems: [Y] = yes [  ]=no Constitional: recent weight change [Y  ];  Wt loss over the last 3 months [  12lbs ] anorexia [  ]; fatigue [  ]; nausea [  ]; night sweats [  ]; fever [  ]; or chills [  ];          Dental: poor dentition[  ]; Last Dentist visit:   Eye : blurred vision [  ]; diplopia [   ]; vision changes [  ];  Amaurosis fugax[  ]; Resp: cough [  ];  wheezing[ N ];  hemoptysis[  ]; shortness of breath[  ]; paroxysmal  nocturnal dyspnea[ N ]; GU: kidney stones [  ]; hematuria[  ];   dysuria [  ];  nocturia[  ];  history of     obstruction [  ]; urinary frequency [  ]             Skin: rash, swelling[  ];, hair loss[  ];  peripheral edema[  ];  or itching[  ]; Musculosketetal: myalgias[  N];  joint swelling[ [place]];  joint erythema[Y  ];  joint pain[  ];  back pain[   ];  Heme/Lymph: bruising[  ];  bleeding[  ];  anemia[  ];  Neuro: TIA[  ];  headaches[  ];  stroke[  ];  vertigo[  ];  seizures[  ];   paresthesias[  ];  difficulty walking[  ];  Psych:depression[  ]; anxiety[  ];  Endocrine: diabetes[Y  ];  thyroid dysfunction[  ];  Immunizations: Flu up to date [ Y ]; Pneumococcal up to date Jazmín.Cullens  ];  Other:  Physical Exam: BP 137/66 mmHg  Pulse 76  Resp 16  Ht 5\' 11"  (1.803 m)  Wt 257 lb (116.574 kg)  BMI 35.86 kg/m2  SpO2 97%  PHYSICAL EXAMINATION:  General appearance: alert, cooperative, appears stated age and no distress Neurologic: intact Heart: regular rate and rhythm, S1, S2 normal, early systolic 2/6  murmur, click, rub or gallop Lungs: clear to auscultation bilaterally Abdomen: soft, non-tender; bowel sounds normal; no masses,  no organomegaly Extremities: extremities normal, atraumatic, no cyanosis or edema and Homans sign is negative, no sign of DVT No carotid bruits Full brachial radial, femoral pedal pulses No cervical axillary or supraclavicular adneopathy   Diagnostic Studies & Laboratory data:    Nm Pet Image Initial (pi) Skull Base To Thigh  08/09/2014   CLINICAL DATA:  Initial treatment strategy for esophageal cancer.  EXAM: NUCLEAR MEDICINE PET SKULL BASE TO THIGH  TECHNIQUE: 13.7 mCi F-18 FDG was injected intravenously. Full-ring PET imaging was performed from the skull base to thigh after the radiotracer. CT data was obtained and used for attenuation correction and anatomic localization.  FASTING BLOOD GLUCOSE:  Value: 105 mg/dl  COMPARISON:  CT of the chest, abdomen and pelvis 07/13/2014.  FINDINGS: NECK  No hypermetabolic lymph nodes in the neck.  CHEST  The previously described mass in the distal esophagus is similar to the prior examination measuring up to 3.3 x 2.8 cm on axial images, and is hypermetabolic (SUVmax = 8.5). This hypermetabolism extends to the level just above the gastroesophageal junction, but does not appear  to extend into the proximal stomach. Previously described distal right paraesophageal lymph node is stable in size measuring 7 mm, and demonstrates low-level metabolic activity (SUVmax = 2.0). The previously described prominent left paraesophageal lymph node is borderline enlarged (9 mm), but demonstrates no convincing hypermetabolism (SUVmax = 1.5). No other suspicious hypermetabolic mediastinal lymph nodes are noted. No suspicious pulmonary nodules on the CT scan. Heart size is borderline enlarged. There is atherosclerosis of the thoracic aorta, the great vessels of the mediastinum and the coronary arteries, including calcified atherosclerotic plaque in the left main, left anterior descending and right coronary arteries. Status post median sternotomy for CABG, including LIMA to the LAD. Severe calcifications of the aortic valve. No consolidative airspace disease. No pleural effusions. Some expansion of extrapleural fat in the base of the left hemithorax is incidentally noted.  ABDOMEN/PELVIS  There are multiple areas of increased metabolic activity throughout the visualized small bowel and colon,  the majority of which are favored to be physiologic. Two of these areas are slightly more conspicuous in the distal rectum (SUVmax = 5.0)and at the anal rectal junction (SUVmax = 6.8), however, neither of these regions have definite masslike characteristics. No abnormal hypermetabolic activity within the liver, pancreas, adrenal glands, or spleen. No hypermetabolic lymph nodes in the abdomen or pelvis. 12 mm calcified gallstone lying dependently in the gallbladder. Multiple left-sided renal lesions are again noted, largest of which measures 6.3 x 4.7 cm, previously characterized as a simple cyst. Extensive atherosclerosis throughout the abdominal and pelvic vasculature.  SKELETON  No focal hypermetabolic activity to suggest skeletal metastasis.  IMPRESSION: 1. Distal esophageal mass is similar to the prior examination and  is hypermetabolic, again concerning for primary esophageal neoplasm. No extension beyond the gastroesophageal junction into the proximal stomach identified on today's examination. Previously noted borderline enlarged paraesophageal lymph nodes demonstrate only low-level metabolic activity. This is nonspecific, however, given their small size and there proximity to this esophageal lesion, the possibility of early lymphatic metastasis is not excluded. 2. Multiple areas of presumably physiologic activity throughout the small bowel and colon. 2 areas in the distal rectum and at the anorectal junction are slightly more conspicuous than others, as discussed above, although these have no definite mass-like characteristics to strongly suggest neoplasm. Correlation with physical examination and sigmoidoscopy is suggested if not recently performed. 3. Atherosclerosis, including left main and 2 vessel coronary artery disease. Status post median sternotomy for CABG, including LIMA to the LAD. 4. There are calcifications of the aortic valve. Echocardiographic correlation for evaluation of potential valvular dysfunction may be warranted if clinically indicated. 5. Additional incidental findings, as above.   Electronically Signed   By: Vinnie Langton M.D.   On: 08/09/2014 08:30      Recent Radiology Findings:  CT chest abdomen 07/13/2014   CLINICAL DATA:  Dysphasia for several months.  Esophageal mass.  EXAM: CT CHEST, ABDOMEN, AND PELVIS WITH CONTRAST  TECHNIQUE: Multidetector CT imaging of the chest, abdomen and pelvis was performed following the standard protocol during bolus administration of intravenous contrast.  CONTRAST:  110mL OMNIPAQUE IOHEXOL 300 MG/ML  SOLN  COMPARISON:  None  FINDINGS: CT CHEST FINDINGS  Mediastinum: Normal heart size. No pericardial effusion. Previous median sternotomy and CABG procedure. Normal appearance of the trachea. The proximal and mid esophagus appears mildly dilated and patulous.  Distal esophageal mass measures 3 x 2.0 x 4.2 cm. There is a an adjacent lymph node measuring 7 mm, image 42/series 2. 1 cm lymph node is identified between the esophagus and descending thoracic aorta at the level of the left mainstem bronchus. No right paratracheal or sub- carinal adenopathy identified. There is no hilar adenopathy.  Lungs/Pleura: No pleural effusion identified. There is no suspicious pulmonary nodule or mass.  Musculoskeletal: Mild thoracic spondylosis identified. No aggressive lytic or sclerotic bone lesions.  CT ABDOMEN AND PELVIS FINDINGS  Hepatobiliary: There is no focal liver abnormality. Gallstone measures 1.5 cm, image 61/series 2. No biliary dilatation.  Pancreas: Normal appearance of the pancreas.  Spleen: Negative.  Adrenals/Urinary Tract: The adrenal glands are normal. Normal appearance of the right kidney. Left renal cysts identified. The urinary bladder appears normal  Stomach/Bowel: The stomach is normal. The small bowel loops have a normal course and caliber without obstruction. Normal appearance of the colon.  Vascular/Lymphatic: Calcified atherosclerotic disease involves the abdominal aorta. No aneurysm. Multiple prominent upper abdominal lymph nodes. Most of these measure less than 1 cm  in short axis. Gastrohepatic ligament lymph node measures 1 cm, image 57/series 2. No retroperitoneal adenopathy. No pelvic or inguinal adenopathy.  Reproductive: Prostate gland appears mildly enlarged. Symmetric appearance of the seminal vesicles.  Other: There is no ascites or focal fluid collections within the abdomen or pelvis.  Musculoskeletal: Review of the visualized osseous structures is negative for aggressive lytic or sclerotic bone lesion. Degenerative disc disease is noted within the lower lumbar spine.  IMPRESSION: 1. Distal esophageal mass concerning for primary esophageal carcinoma. 2. Prominent posterior mediastinal lymph nodes measure up to 1 cm. Within the upper abdomen there are  multiple small lymph nodes. The largest is in the gastrohepatic ligament region measuring 1 cm. Consider further evaluation with PET-CT. 3. Atherosclerotic disease. 4. Gallstone.   Electronically Signed   By: Kerby Moors M.D.   On: 07/13/2014 15:39   I have independently reviewed the above radiology studies  and reviewed the findings with the patient.     Recent Lab Findings: Lab Results  Component Value Date   WBC 8.3 07/12/2014   HGB 13.3 07/12/2014   HCT 40.5 07/12/2014   PLT 204.0 07/12/2014   GLUCOSE 119 08/03/2014   CHOL 120 10/27/2013   TRIG 246.0* 10/27/2013   HDL 33.90* 10/27/2013   LDLDIRECT 57.7 08/24/2011   LDLCALC 37 10/27/2013   ALT 23 08/03/2014   AST 20 08/03/2014   NA 143 08/03/2014   K 4.8 08/03/2014   CL 104 07/12/2014   CREATININE 1.3 08/03/2014   BUN 16.3 08/03/2014   CO2 28 08/03/2014   TSH 0.79 10/27/2013   HGBA1C 7.1* 04/28/2014     ESOPHAGUS: A half circumferential fungating mass was found in the distal esophagus at 38 cm extending to the GE junction at 43 cm. The lesion narrows the esophageal lumen, but the standard adult upper endoscope passes the tumor without difficulty. Multiple biopsies were performed using cold forceps. There appears to be circumferential Barrett's mucosa extending up to the top of the tumor. STOMACH: The mucosa of the stomach appeared normal with small hiatal hernia. DUODENUM: Mild duodenal inflammation was found in the duodenal bulb and sweep. The 2nd portion of the duodenum was normal. Retroflexed views revealed a hiatal hernia. The scope was then withdrawn from the patient and the procedure completed. COMPLICATIONS: There were no immediate complications. ENDOSCOPIC IMPRESSION: 1. 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 Small hiatal hernia 2. The mucosa of the stomach appeared normal 3. Mild peptic duodenitis was found in  the duodenal bulb Assessment / Plan:    Path:   Diagnosis Esophagus, biopsy, distal - ADENOCARCINOMA, SEE COMMENT. Microscopic Comment The adenocarcinoma is arising in a background of high grade glandular dysplasia. The adenocarcinoma involves at least lamina propria. Although there is smooth muscle involvement by tumor, hypertrophic and redundant muscularis mucosa is known to occur in Barrett's esophagus. The case was reviewed with Dr. Saralyn Pilar who concurs. The case was discussed with Dr. Hilarie Fredrickson on 07/13/2014. (CR:kh 07/13/14) Mali RUND DO Pathologist, Electronic Signature EUS: Endoscopic findings: 1. Non-cirumferential but bulky tumor in distal esophagus. This was 6cm long, the distal end was a the GE junction. It was slightly difficult to maneuver the large diameter echoendoscopes past the mass. EUS findings: 1. The mass above corresponded with a hypoechoic, heterogneous mass that clearly passed into and through the muscularis propria layer of the esophageal wall (uT3). 2. There were 2 hypoechoic lymphnodes adjacent to the proximal edge  of the primary tumor that were suspicious for malignancy (homogeneous, round, largest was 9.56mm) (uN1). 3. I did not appreciate other adenopathy (at gastrohepatic ligament). 4. 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 Extensive discussion about esophageal cancer and treatment and also specifics about surgical resection. I'll plan to see the patient back March 10 at that time  will make appointments for him to see cardiology for preoperative cardiac clearance and also schedule follow-up staging scans prior to consideration of surgical resection 6-8 weeks following the completion of his radiation. Cares been coordinated with Dr. Benay Spice and Dr. Dwana Curd.   I spent 60 minutes counseling the patient face to face. The total time spent in the appointment was 80 minutes.  Grace Isaac MD      [address] [place] [address] Office [phone]   Beeper [phone]  08/12/2014 2:23 PM

## 2014-08-13 ENCOUNTER — Ambulatory Visit
Admission: RE | Admit: 2014-08-13 | Discharge: 2014-08-13 | Disposition: A | Discharge status: Home / Self Care | Payer: BLUE CROSS/BLUE SHIELD | Source: Ambulatory Visit | Attending: Radiation Oncology | Admitting: Radiation Oncology

## 2014-08-13 ENCOUNTER — Ambulatory Visit: Payer: BLUE CROSS/BLUE SHIELD

## 2014-08-13 DIAGNOSIS — Z510 Encounter for antineoplastic radiation therapy: Secondary | ICD-10-CM | POA: Diagnosis not present

## 2014-08-15 ENCOUNTER — Other Ambulatory Visit: Payer: Self-pay | Admitting: Oncology

## 2014-08-16 ENCOUNTER — Ambulatory Visit
Admission: RE | Admit: 2014-08-16 | Discharge: 2014-08-16 | Disposition: A | Discharge status: Home / Self Care | Payer: BLUE CROSS/BLUE SHIELD | Source: Ambulatory Visit | Attending: Radiation Oncology | Admitting: Radiation Oncology

## 2014-08-16 ENCOUNTER — Ambulatory Visit: Payer: BLUE CROSS/BLUE SHIELD

## 2014-08-16 DIAGNOSIS — Z510 Encounter for antineoplastic radiation therapy: Secondary | ICD-10-CM | POA: Diagnosis not present

## 2014-08-17 ENCOUNTER — Ambulatory Visit (HOSPITAL_BASED_OUTPATIENT_CLINIC_OR_DEPARTMENT_OTHER): Payer: BLUE CROSS/BLUE SHIELD

## 2014-08-17 ENCOUNTER — Ambulatory Visit: Payer: BLUE CROSS/BLUE SHIELD

## 2014-08-17 ENCOUNTER — Ambulatory Visit
Admission: RE | Admit: 2014-08-17 | Discharge: 2014-08-17 | Disposition: A | Discharge status: Home / Self Care | Payer: BLUE CROSS/BLUE SHIELD | Source: Ambulatory Visit | Attending: Radiation Oncology | Admitting: Radiation Oncology

## 2014-08-17 ENCOUNTER — Ambulatory Visit (HOSPITAL_BASED_OUTPATIENT_CLINIC_OR_DEPARTMENT_OTHER): Payer: BLUE CROSS/BLUE SHIELD | Admitting: Nurse Practitioner

## 2014-08-17 ENCOUNTER — Telehealth: Payer: Self-pay | Admitting: *Deleted

## 2014-08-17 ENCOUNTER — Encounter: Payer: Self-pay | Admitting: Radiation Oncology

## 2014-08-17 ENCOUNTER — Other Ambulatory Visit (HOSPITAL_BASED_OUTPATIENT_CLINIC_OR_DEPARTMENT_OTHER): Payer: BLUE CROSS/BLUE SHIELD

## 2014-08-17 ENCOUNTER — Telehealth: Payer: Self-pay | Admitting: Oncology

## 2014-08-17 ENCOUNTER — Ambulatory Visit: Payer: BLUE CROSS/BLUE SHIELD | Admitting: Nutrition

## 2014-08-17 VITALS — BP 133/58 | HR 70 | Temp 97.8°F | Resp 12 | Ht 71.0 in | Wt 258.3 lb

## 2014-08-17 VITALS — BP 129/54 | HR 62 | Temp 97.9°F | Resp 18 | Ht 71.0 in | Wt 256.8 lb

## 2014-08-17 DIAGNOSIS — E119 Type 2 diabetes mellitus without complications: Secondary | ICD-10-CM

## 2014-08-17 DIAGNOSIS — C155 Malignant neoplasm of lower third of esophagus: Secondary | ICD-10-CM

## 2014-08-17 DIAGNOSIS — C159 Malignant neoplasm of esophagus, unspecified: Secondary | ICD-10-CM

## 2014-08-17 DIAGNOSIS — E785 Hyperlipidemia, unspecified: Secondary | ICD-10-CM

## 2014-08-17 DIAGNOSIS — R1310 Dysphagia, unspecified: Secondary | ICD-10-CM

## 2014-08-17 DIAGNOSIS — I350 Nonrheumatic aortic (valve) stenosis: Secondary | ICD-10-CM

## 2014-08-17 DIAGNOSIS — Z510 Encounter for antineoplastic radiation therapy: Secondary | ICD-10-CM | POA: Diagnosis not present

## 2014-08-17 DIAGNOSIS — I25810 Atherosclerosis of coronary artery bypass graft(s) without angina pectoris: Secondary | ICD-10-CM

## 2014-08-17 DIAGNOSIS — Z5111 Encounter for antineoplastic chemotherapy: Secondary | ICD-10-CM

## 2014-08-17 DIAGNOSIS — I10 Essential (primary) hypertension: Secondary | ICD-10-CM

## 2014-08-17 LAB — CBC WITH DIFFERENTIAL/PLATELET
BASO%: 0.2 % (ref 0.0–2.0)
Basophils Absolute: 0 10*3/uL (ref 0.0–0.1)
EOS%: 1.4 % (ref 0.0–7.0)
Eosinophils Absolute: 0.1 10*3/uL (ref 0.0–0.5)
HEMATOCRIT: 39.4 % (ref 38.4–49.9)
HGB: 13.3 g/dL (ref 13.0–17.1)
LYMPH%: 9.3 % — AB (ref 14.0–49.0)
MCH: 29.7 pg (ref 27.2–33.4)
MCHC: 33.8 g/dL (ref 32.0–36.0)
MCV: 87.9 fL (ref 79.3–98.0)
MONO#: 0.8 10*3/uL (ref 0.1–0.9)
MONO%: 9.5 % (ref 0.0–14.0)
NEUT#: 6.8 10*3/uL — ABNORMAL HIGH (ref 1.5–6.5)
NEUT%: 79.6 % — AB (ref 39.0–75.0)
Platelets: 188 10*3/uL (ref 140–400)
RBC: 4.48 10*6/uL (ref 4.20–5.82)
RDW: 12.1 % (ref 11.0–14.6)
WBC: 8.5 10*3/uL (ref 4.0–10.3)
lymph#: 0.8 10*3/uL — ABNORMAL LOW (ref 0.9–3.3)

## 2014-08-17 MED ORDER — SODIUM CHLORIDE 0.9 % IV SOLN
Freq: Once | INTRAVENOUS | Status: AC
  Administered 2014-08-17: 11:00:00 via INTRAVENOUS

## 2014-08-17 MED ORDER — DEXAMETHASONE SODIUM PHOSPHATE 10 MG/ML IJ SOLN
INTRAMUSCULAR | Status: AC
  Filled 2014-08-17: qty 1

## 2014-08-17 MED ORDER — DIPHENHYDRAMINE HCL 50 MG/ML IJ SOLN
25.0000 mg | Freq: Once | INTRAMUSCULAR | Status: AC
  Administered 2014-08-17: 25 mg via INTRAVENOUS

## 2014-08-17 MED ORDER — FAMOTIDINE IN NACL 20-0.9 MG/50ML-% IV SOLN
INTRAVENOUS | Status: AC
  Filled 2014-08-17: qty 50

## 2014-08-17 MED ORDER — ONDANSETRON 16 MG/50ML IVPB (CHCC)
16.0000 mg | Freq: Once | INTRAVENOUS | Status: AC
  Administered 2014-08-17: 16 mg via INTRAVENOUS

## 2014-08-17 MED ORDER — DIPHENHYDRAMINE HCL 50 MG/ML IJ SOLN
INTRAMUSCULAR | Status: AC
  Filled 2014-08-17: qty 1

## 2014-08-17 MED ORDER — SODIUM CHLORIDE 0.9 % IV SOLN
222.0000 mg | Freq: Once | INTRAVENOUS | Status: AC
  Administered 2014-08-17: 220 mg via INTRAVENOUS
  Filled 2014-08-17: qty 22

## 2014-08-17 MED ORDER — FAMOTIDINE IN NACL 20-0.9 MG/50ML-% IV SOLN
20.0000 mg | Freq: Once | INTRAVENOUS | Status: AC
  Administered 2014-08-17: 20 mg via INTRAVENOUS

## 2014-08-17 MED ORDER — DEXAMETHASONE SODIUM PHOSPHATE 10 MG/ML IJ SOLN
10.0000 mg | Freq: Once | INTRAMUSCULAR | Status: AC
  Administered 2014-08-17: 10 mg via INTRAVENOUS

## 2014-08-17 MED ORDER — PACLITAXEL CHEMO INJECTION 300 MG/50ML
45.0000 mg/m2 | Freq: Once | INTRAVENOUS | Status: AC
  Administered 2014-08-17: 108 mg via INTRAVENOUS
  Filled 2014-08-17: qty 18

## 2014-08-17 MED ORDER — ONDANSETRON 16 MG/50ML IVPB (CHCC)
INTRAVENOUS | Status: AC
  Filled 2014-08-17: qty 16

## 2014-08-17 NOTE — Progress Notes (Signed)
Nutrition follow-up completed with patient being treated for cancer of the distal esophagus. Patient reports he continues to have solid food dysphagia. He must eat slowly and chew his food well. Weight decreased slightly to 256.8 pounds February 9, down from 258.3 pounds January 19. No new nutrition issues.  Nutrition diagnosis: Unintended weight loss continues.  Intervention: Recommended patient continue small frequent meals and snacks utilizing high-calorie high-protein foods. Patient encouraged to consume Ensure Plus or boost plus a minimum of 4 bottles daily. Recommended soft easy to swallow Protein foods. Questions were answered.  Teach back method used.  Monitoring, evaluation, goals: Patient will continue to work to increase calories and protein to minimize weight loss and maintain lean body mass.  Next visit: Tuesday, February 16, during chemotherapy.  **Disclaimer: This note was dictated with voice recognition software. Similar sounding words can inadvertently be transcribed and this note may contain transcription errors which may not have been corrected upon publication of note.**

## 2014-08-17 NOTE — Telephone Encounter (Signed)
Per staff message and POF I have scheduled appts. Advised scheduler of appts. JMW  

## 2014-08-17 NOTE — Progress Notes (Signed)
  Radiation Oncology         [phone] ________________________________  Name: [name] MRN: [id number]  Date: 08/17/2014  DOB: 1940/08/22  Weekly Radiation Therapy Management  DIAGNOSIS: uT3, N1, Mx adenocarcinoma of the distal esophagus/GE junction  Current Dose: 10.8 Gy     Planned Dose:  50.4 Gy  Narrative . . . . . . . . The patient presents for routine under treatment assessment.                                   The patient is without complaint. He was able to eat a sandwich last Thursday which has not been possible over the past several months in light of his esophageal mass. He is tolerating chemotherapy well.                                 Set-up films were reviewed.                                 The chart was checked. Physical Findings. . .  height is 5\' 11"  (1.803 m) and weight is 258 lb 4.8 oz (117.164 kg). His oral temperature is 97.8 F (36.6 C). His blood pressure is 133/58 and his pulse is 70. His respiration is 12 and oxygen saturation is 98%. . The lungs are clear. The heart has a regular rhythm and rate. Impression . . . . . . . The patient is tolerating radiation. Plan . . . . . . . . . . . . Continue treatment as planned.  ________________________________   Blair Promise, PhD, MD

## 2014-08-17 NOTE — Telephone Encounter (Signed)
gv and printed appt sched and avs for pt for Feb and March....sed added tx. °

## 2014-08-17 NOTE — Progress Notes (Signed)
[name] has completed 6 fractions to his esophagus.  He denies pain.  He reports that his swallowing is a little better and was able to eat a white bread sandwich last Thursday.  He has not been able to eat this for months.  He had chemotherapy today.  He denies any skin irritation on his chest.

## 2014-08-17 NOTE — Progress Notes (Signed)
Per Ned Card NP, okay to tx without CMET.

## 2014-08-17 NOTE — Patient Instructions (Signed)
Cancer Center Discharge Instructions for Patients Receiving Chemotherapy  Today you received the following chemotherapy agents Taxol/Carboplatin  To help prevent nausea and vomiting after your treatment, we encourage you to take your nausea medication as directed.   If you develop nausea and vomiting that is not controlled by your nausea medication, call the clinic.   BELOW ARE SYMPTOMS THAT SHOULD BE REPORTED IMMEDIATELY:  *FEVER GREATER THAN 100.5 F  *CHILLS WITH OR WITHOUT FEVER  NAUSEA AND VOMITING THAT IS NOT CONTROLLED WITH YOUR NAUSEA MEDICATION  *UNUSUAL SHORTNESS OF BREATH  *UNUSUAL BRUISING OR BLEEDING  TENDERNESS IN MOUTH AND THROAT WITH OR WITHOUT PRESENCE OF ULCERS  *URINARY PROBLEMS  *BOWEL PROBLEMS  UNUSUAL RASH Items with * indicate a potential emergency and should be followed up as soon as possible.  Feel free to call the clinic you have any questions or concerns. The clinic phone number is [phone].    

## 2014-08-17 NOTE — Progress Notes (Signed)
  [facility] OFFICE PROGRESS NOTE   Diagnosis:  Esophagus cancer  INTERVAL HISTORY:   [name] returns as scheduled. He completed week 1 Taxol/carboplatin 08/10/2014. He denies nausea/vomiting. No mouth sores. No diarrhea. No numbness or tingling in his hands or feet. He reports continued dysphagia. He is tolerating liquids and soft solids without difficulty.  Objective:  Vital signs in last 24 hours:  Blood pressure 129/54, pulse 62, temperature 97.9 F (36.6 C), temperature source Oral, resp. rate 18, height 5\' 11"  (1.803 m), weight 256 lb 12.8 oz (116.484 kg).    HEENT: No thrush or ulcers. Resp: Lungs clear bilaterally. Cardio: Regular rate and rhythm. Faint systolic murmur. GI: Abdomen soft and nontender. No hepatomegaly. Vascular: No leg edema.   Lab Results:  Lab Results  Component Value Date   WBC 8.5 08/17/2014   HGB 13.3 08/17/2014   HCT 39.4 08/17/2014   MCV 87.9 08/17/2014   PLT 188 08/17/2014   NEUTROABS 6.8* 08/17/2014    Imaging:  No results found.  Medications: I have reviewed the patient's current medications.  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 No extension beyond the gastroesophageal junction into the proximal stomach. Previously noted borderline enlarged paraesophageal lymph nodes showed low level metabolic activity. Multiple areas of presumably physiologic activity throughout the small bowel and colon.  Initiation of radiation and weekly Taxol/carboplatin 08/10/2014  2. Solid dysphagia secondary to #1   3. History  of coronary artery disease, status post coronary artery bypass surgery in 2002   4. Diabetes   5. Hypertension   6. Hyperlipidemia   7. Aortic stenosis    Disposition: [name] appears stable. Plan to proceed with week 2 Taxol/carboplatin today as scheduled. He will return for week 3 on 08/24/2014. We will see him in follow-up prior to week 4 on 08/31/2014. He will contact the office in the interim with any problems.  Plan reviewed with Dr. Benay Spice.    Ned Card ANP/GNP-BC   08/17/2014  10:44 AM

## 2014-08-18 ENCOUNTER — Ambulatory Visit
Admission: RE | Admit: 2014-08-18 | Discharge: 2014-08-18 | Disposition: A | Discharge status: Home / Self Care | Payer: BLUE CROSS/BLUE SHIELD | Source: Ambulatory Visit | Attending: Radiation Oncology | Admitting: Radiation Oncology

## 2014-08-18 ENCOUNTER — Ambulatory Visit: Payer: BLUE CROSS/BLUE SHIELD

## 2014-08-18 DIAGNOSIS — Z510 Encounter for antineoplastic radiation therapy: Secondary | ICD-10-CM | POA: Diagnosis not present

## 2014-08-19 ENCOUNTER — Ambulatory Visit
Admission: RE | Admit: 2014-08-19 | Discharge: 2014-08-19 | Disposition: A | Discharge status: Home / Self Care | Payer: BLUE CROSS/BLUE SHIELD | Source: Ambulatory Visit | Attending: Radiation Oncology | Admitting: Radiation Oncology

## 2014-08-19 ENCOUNTER — Ambulatory Visit: Payer: BLUE CROSS/BLUE SHIELD

## 2014-08-19 DIAGNOSIS — Z510 Encounter for antineoplastic radiation therapy: Secondary | ICD-10-CM | POA: Diagnosis not present

## 2014-08-20 ENCOUNTER — Ambulatory Visit: Payer: BLUE CROSS/BLUE SHIELD

## 2014-08-20 ENCOUNTER — Ambulatory Visit
Admission: RE | Admit: 2014-08-20 | Discharge: 2014-08-20 | Disposition: A | Discharge status: Home / Self Care | Payer: BLUE CROSS/BLUE SHIELD | Source: Ambulatory Visit | Attending: Radiation Oncology | Admitting: Radiation Oncology

## 2014-08-20 DIAGNOSIS — Z510 Encounter for antineoplastic radiation therapy: Secondary | ICD-10-CM | POA: Diagnosis not present

## 2014-08-22 ENCOUNTER — Other Ambulatory Visit: Payer: Self-pay | Admitting: Oncology

## 2014-08-23 ENCOUNTER — Ambulatory Visit: Payer: BLUE CROSS/BLUE SHIELD

## 2014-08-23 ENCOUNTER — Ambulatory Visit
Admission: RE | Admit: 2014-08-23 | Discharge: 2014-08-23 | Disposition: A | Discharge status: Home / Self Care | Payer: BLUE CROSS/BLUE SHIELD | Source: Ambulatory Visit | Attending: Radiation Oncology | Admitting: Radiation Oncology

## 2014-08-23 DIAGNOSIS — Z510 Encounter for antineoplastic radiation therapy: Secondary | ICD-10-CM | POA: Diagnosis not present

## 2014-08-24 ENCOUNTER — Other Ambulatory Visit (HOSPITAL_BASED_OUTPATIENT_CLINIC_OR_DEPARTMENT_OTHER): Payer: BLUE CROSS/BLUE SHIELD

## 2014-08-24 ENCOUNTER — Ambulatory Visit (HOSPITAL_BASED_OUTPATIENT_CLINIC_OR_DEPARTMENT_OTHER): Payer: BLUE CROSS/BLUE SHIELD

## 2014-08-24 ENCOUNTER — Ambulatory Visit
Admission: RE | Admit: 2014-08-24 | Discharge: 2014-08-24 | Disposition: A | Discharge status: Home / Self Care | Payer: BLUE CROSS/BLUE SHIELD | Source: Ambulatory Visit | Attending: Radiation Oncology | Admitting: Radiation Oncology

## 2014-08-24 ENCOUNTER — Encounter: Payer: Self-pay | Admitting: Radiation Oncology

## 2014-08-24 ENCOUNTER — Ambulatory Visit: Payer: BLUE CROSS/BLUE SHIELD | Admitting: Nutrition

## 2014-08-24 ENCOUNTER — Ambulatory Visit: Payer: BLUE CROSS/BLUE SHIELD

## 2014-08-24 VITALS — BP 125/76 | HR 62 | Temp 97.9°F | Resp 16 | Ht 71.0 in | Wt 254.6 lb

## 2014-08-24 DIAGNOSIS — Z5111 Encounter for antineoplastic chemotherapy: Secondary | ICD-10-CM

## 2014-08-24 DIAGNOSIS — C155 Malignant neoplasm of lower third of esophagus: Secondary | ICD-10-CM

## 2014-08-24 DIAGNOSIS — C159 Malignant neoplasm of esophagus, unspecified: Secondary | ICD-10-CM

## 2014-08-24 DIAGNOSIS — Z510 Encounter for antineoplastic radiation therapy: Secondary | ICD-10-CM | POA: Diagnosis not present

## 2014-08-24 LAB — CBC WITH DIFFERENTIAL/PLATELET
BASO%: 0.6 % (ref 0.0–2.0)
Basophils Absolute: 0 10*3/uL (ref 0.0–0.1)
EOS%: 2 % (ref 0.0–7.0)
Eosinophils Absolute: 0.1 10*3/uL (ref 0.0–0.5)
HCT: 42.6 % (ref 38.4–49.9)
HEMOGLOBIN: 13.8 g/dL (ref 13.0–17.1)
LYMPH#: 0.7 10*3/uL — AB (ref 0.9–3.3)
LYMPH%: 11.7 % — ABNORMAL LOW (ref 14.0–49.0)
MCH: 28.6 pg (ref 27.2–33.4)
MCHC: 32.5 g/dL (ref 32.0–36.0)
MCV: 88 fL (ref 79.3–98.0)
MONO#: 0.6 10*3/uL (ref 0.1–0.9)
MONO%: 11.4 % (ref 0.0–14.0)
NEUT#: 4.1 10*3/uL (ref 1.5–6.5)
NEUT%: 74.3 % (ref 39.0–75.0)
Platelets: 198 10*3/uL (ref 140–400)
RBC: 4.84 10*6/uL (ref 4.20–5.82)
RDW: 12.4 % (ref 11.0–14.6)
WBC: 5.6 10*3/uL (ref 4.0–10.3)

## 2014-08-24 LAB — COMPREHENSIVE METABOLIC PANEL (CC13)
ALK PHOS: 55 U/L (ref 40–150)
ALT: 20 U/L (ref 0–55)
AST: 21 U/L (ref 5–34)
Albumin: 3.9 g/dL (ref 3.5–5.0)
Anion Gap: 12 mEq/L — ABNORMAL HIGH (ref 3–11)
BUN: 34.8 mg/dL — ABNORMAL HIGH (ref 7.0–26.0)
CO2: 20 mEq/L — ABNORMAL LOW (ref 22–29)
Calcium: 9.7 mg/dL (ref 8.4–10.4)
Chloride: 106 mEq/L (ref 98–109)
Creatinine: 1.4 mg/dL — ABNORMAL HIGH (ref 0.7–1.3)
EGFR: 50 mL/min/{1.73_m2} — ABNORMAL LOW (ref >90)
Glucose: 118 mg/dl (ref 70–140)
Potassium: 5.1 mEq/L (ref 3.5–5.1)
Sodium: 137 mEq/L (ref 136–145)
Total Bilirubin: 0.9 mg/dL (ref 0.20–1.20)
Total Protein: 7.1 g/dL (ref 6.4–8.3)

## 2014-08-24 MED ORDER — SODIUM CHLORIDE 0.9 % IV SOLN
222.0000 mg | Freq: Once | INTRAVENOUS | Status: AC
  Administered 2014-08-24: 220 mg via INTRAVENOUS
  Filled 2014-08-24: qty 22

## 2014-08-24 MED ORDER — DIPHENHYDRAMINE HCL 50 MG/ML IJ SOLN
25.0000 mg | Freq: Once | INTRAMUSCULAR | Status: AC
  Administered 2014-08-24: 25 mg via INTRAVENOUS

## 2014-08-24 MED ORDER — FAMOTIDINE IN NACL 20-0.9 MG/50ML-% IV SOLN
20.0000 mg | Freq: Once | INTRAVENOUS | Status: AC
  Administered 2014-08-24: 20 mg via INTRAVENOUS

## 2014-08-24 MED ORDER — DEXAMETHASONE SODIUM PHOSPHATE 10 MG/ML IJ SOLN
INTRAMUSCULAR | Status: AC
  Filled 2014-08-24: qty 1

## 2014-08-24 MED ORDER — DIPHENHYDRAMINE HCL 50 MG/ML IJ SOLN
INTRAMUSCULAR | Status: AC
  Filled 2014-08-24: qty 1

## 2014-08-24 MED ORDER — PACLITAXEL CHEMO INJECTION 300 MG/50ML
45.0000 mg/m2 | Freq: Once | INTRAVENOUS | Status: AC
  Administered 2014-08-24: 108 mg via INTRAVENOUS
  Filled 2014-08-24: qty 18

## 2014-08-24 MED ORDER — FAMOTIDINE IN NACL 20-0.9 MG/50ML-% IV SOLN
INTRAVENOUS | Status: AC
  Filled 2014-08-24: qty 50

## 2014-08-24 MED ORDER — ONDANSETRON 16 MG/50ML IVPB (CHCC)
16.0000 mg | Freq: Once | INTRAVENOUS | Status: AC
  Administered 2014-08-24: 16 mg via INTRAVENOUS

## 2014-08-24 MED ORDER — ONDANSETRON 16 MG/50ML IVPB (CHCC)
INTRAVENOUS | Status: AC
  Filled 2014-08-24: qty 16

## 2014-08-24 MED ORDER — SODIUM CHLORIDE 0.9 % IV SOLN
Freq: Once | INTRAVENOUS | Status: AC
  Administered 2014-08-24: 13:00:00 via INTRAVENOUS

## 2014-08-24 MED ORDER — DEXAMETHASONE SODIUM PHOSPHATE 10 MG/ML IJ SOLN
10.0000 mg | Freq: Once | INTRAMUSCULAR | Status: AC
  Administered 2014-08-24: 10 mg via INTRAVENOUS

## 2014-08-24 MED ORDER — SUCRALFATE 1 GM/10ML PO SUSP: 1.0000 g | Freq: Three times a day (TID) | ORAL | Status: DC

## 2014-08-24 NOTE — Progress Notes (Signed)
Nutrition follow-up completed with patient and wife, during chemotherapy. Patient reports increased difficulty swallowing. Physician has added Carafate. Patient is drinking 3-4 Ensure Plus daily. Currently he is unable to consume food of any kind other than ice cream. Patient denies pain with swallowing. Weight decreased and documented as 254.6 pounds February 16, down from 258.3 pounds January 19  Estimated nutrition needs: 2300-2500 cal, 130-140 grams protein, 2.5 L fluid.  Nutrition diagnosis: Unintended weight loss continues.  Intervention: Recommended patient requires approximately 7 Ensure Plus daily if he is unable to eat food. (2450 cal, 91 g protein, 1260 mL free water) Recommended patient try pureeing protein foods to appropriate consistency. Provided education on strategies for adding calories and protein. Recommended patient add Unjury protein powder daily for increased protein. (adds 100 calories and 20 grams protein per packet) Provided samples and recipes. Questions were answered.  Teach back method used.  Monitoring, evaluation, goals: Patient will work to continue to increase calories and protein to minimize weight loss and maintain lean body mass.  Next visit: Tuesday, February 23, during chemotherapy.  **Disclaimer: This note was dictated with voice recognition software. Similar sounding words can inadvertently be transcribed and this note may contain transcription errors which may not have been corrected upon publication of note.**

## 2014-08-24 NOTE — Progress Notes (Addendum)
[name] has completed 11 fractions to his esophagus.  He denies pain.  He reports the soreness in his chest from holding his arms up during treatment is better.  He reports more trouble swallowing with food getting stuck in the upper part of his chest.  He said it used to feel like food was stuck in his abdomen.  He reports he ate tuna salad yesterday and it did not go down.  He had to throw it up.  He is able to drink boost/ensures about 3 a day.  He reports trouble swallowing water.  His weight is down 2 lbs from last week. He is going to have chemotherapy today.  He reports fatigue.  BP 125/76 mmHg  Pulse 62  Temp(Src) 97.9 F (36.6 C) (Oral)  Resp 16  Ht 5\' 11"  (1.803 m)  Wt 254 lb 9.6 oz (115.486 kg)  BMI 35.53 kg/m2

## 2014-08-24 NOTE — Progress Notes (Signed)
  Radiation Oncology         [phone]) [phone] ________________________________  Name: [name] MRN: [id number]  Date: 08/24/2014  DOB: Nov 16, 1940  Weekly Radiation Therapy Management  DIAGNOSIS: uT3, N1, Mx adenocarcinoma of the distal esophagus/GE junction  Current Dose: 19.8 Gy     Planned Dose:  50.4 Gy  Narrative . . . . . . . . The patient presents for routine under treatment assessment.                                   The patient has noticed a little more difficulty with swallowing liquids and solids. He is resorted to taking in liquids at this time. He actually denies any pain but mechanically is having trouble swallowing. He is scheduled for additional chemotherapy today                                 Set-up films were reviewed.                                 The chart was checked. Physical Findings. . .  height is 5\' 11"  (1.803 m) and weight is 254 lb 9.6 oz (115.486 kg). His oral temperature is 97.9 F (36.6 C). His blood pressure is 125/76 and his pulse is 62. His respiration is 16. .  The oral cavity is moist without secondary infection. The lungs are clear to auscultation. The heart has a regular rhythm and rate. Impression . . . . . . . The patient is tolerating radiation. Plan . . . . . . . . . . . . Continue treatment as planned. Patient will be placed on Carafate suspension. He will discuss additional nutritional advice during his chemotherapy session today with the nutritionist  ________________________________   Blair Promise, PhD, MD

## 2014-08-24 NOTE — Patient Instructions (Signed)
[facility] Discharge Instructions for Patients Receiving Chemotherapy  Today you received the following chemotherapy agents taxol/carboplatin  To help prevent nausea and vomiting after your treatment, we encourage you to take your nausea medication as directed   If you develop nausea and vomiting that is not controlled by your nausea medication, call the clinic.   BELOW ARE SYMPTOMS THAT SHOULD BE REPORTED IMMEDIATELY:  *FEVER GREATER THAN 100.5 F  *CHILLS WITH OR WITHOUT FEVER  NAUSEA AND VOMITING THAT IS NOT CONTROLLED WITH YOUR NAUSEA MEDICATION  *UNUSUAL SHORTNESS OF BREATH  *UNUSUAL BRUISING OR BLEEDING  TENDERNESS IN MOUTH AND THROAT WITH OR WITHOUT PRESENCE OF ULCERS  *URINARY PROBLEMS  *BOWEL PROBLEMS  UNUSUAL RASH Items with * indicate a potential emergency and should be followed up as soon as possible.  Feel free to call the clinic you have any questions or concerns. The clinic phone number is [phone].  

## 2014-08-25 ENCOUNTER — Ambulatory Visit: Payer: BLUE CROSS/BLUE SHIELD

## 2014-08-25 ENCOUNTER — Ambulatory Visit
Admission: RE | Admit: 2014-08-25 | Discharge: 2014-08-25 | Disposition: A | Discharge status: Home / Self Care | Payer: BLUE CROSS/BLUE SHIELD | Source: Ambulatory Visit | Attending: Radiation Oncology | Admitting: Radiation Oncology

## 2014-08-25 DIAGNOSIS — Z510 Encounter for antineoplastic radiation therapy: Secondary | ICD-10-CM | POA: Diagnosis not present

## 2014-08-26 ENCOUNTER — Ambulatory Visit: Payer: BLUE CROSS/BLUE SHIELD

## 2014-08-26 ENCOUNTER — Ambulatory Visit
Admission: RE | Admit: 2014-08-26 | Discharge: 2014-08-26 | Disposition: A | Discharge status: Home / Self Care | Payer: BLUE CROSS/BLUE SHIELD | Source: Ambulatory Visit | Attending: Radiation Oncology | Admitting: Radiation Oncology

## 2014-08-26 DIAGNOSIS — Z510 Encounter for antineoplastic radiation therapy: Secondary | ICD-10-CM | POA: Diagnosis not present

## 2014-08-27 ENCOUNTER — Ambulatory Visit
Admission: RE | Admit: 2014-08-27 | Discharge: 2014-08-27 | Disposition: A | Discharge status: Home / Self Care | Payer: BLUE CROSS/BLUE SHIELD | Source: Ambulatory Visit | Attending: Radiation Oncology | Admitting: Radiation Oncology

## 2014-08-27 ENCOUNTER — Ambulatory Visit: Payer: BLUE CROSS/BLUE SHIELD

## 2014-08-27 DIAGNOSIS — Z510 Encounter for antineoplastic radiation therapy: Secondary | ICD-10-CM | POA: Diagnosis not present

## 2014-08-28 ENCOUNTER — Other Ambulatory Visit: Payer: Self-pay | Admitting: Family Medicine

## 2014-08-29 ENCOUNTER — Other Ambulatory Visit: Payer: Self-pay | Admitting: Oncology

## 2014-08-30 ENCOUNTER — Ambulatory Visit: Payer: BLUE CROSS/BLUE SHIELD

## 2014-08-30 ENCOUNTER — Ambulatory Visit
Admission: RE | Admit: 2014-08-30 | Discharge: 2014-08-30 | Disposition: A | Discharge status: Home / Self Care | Payer: BLUE CROSS/BLUE SHIELD | Source: Ambulatory Visit | Attending: Radiation Oncology | Admitting: Radiation Oncology

## 2014-08-30 DIAGNOSIS — Z510 Encounter for antineoplastic radiation therapy: Secondary | ICD-10-CM | POA: Diagnosis not present

## 2014-08-30 NOTE — Addendum Note (Signed)
Encounter addended by: Jacqulyn Liner, RN on: 08/30/2014 11:55 AM<BR>     Documentation filed: Notes Section

## 2014-08-31 ENCOUNTER — Ambulatory Visit (HOSPITAL_BASED_OUTPATIENT_CLINIC_OR_DEPARTMENT_OTHER): Payer: BLUE CROSS/BLUE SHIELD

## 2014-08-31 ENCOUNTER — Ambulatory Visit (HOSPITAL_BASED_OUTPATIENT_CLINIC_OR_DEPARTMENT_OTHER): Payer: BLUE CROSS/BLUE SHIELD | Admitting: Oncology

## 2014-08-31 ENCOUNTER — Other Ambulatory Visit: Payer: Self-pay | Admitting: Oncology

## 2014-08-31 ENCOUNTER — Other Ambulatory Visit (HOSPITAL_BASED_OUTPATIENT_CLINIC_OR_DEPARTMENT_OTHER): Payer: BLUE CROSS/BLUE SHIELD

## 2014-08-31 ENCOUNTER — Encounter: Payer: Self-pay | Admitting: Radiation Oncology

## 2014-08-31 ENCOUNTER — Ambulatory Visit
Admission: RE | Admit: 2014-08-31 | Discharge: 2014-08-31 | Disposition: A | Discharge status: Home / Self Care | Payer: BLUE CROSS/BLUE SHIELD | Source: Ambulatory Visit | Attending: Radiation Oncology | Admitting: Radiation Oncology

## 2014-08-31 ENCOUNTER — Telehealth: Payer: Self-pay | Admitting: Oncology

## 2014-08-31 ENCOUNTER — Telehealth: Payer: Self-pay | Admitting: *Deleted

## 2014-08-31 ENCOUNTER — Ambulatory Visit: Payer: BLUE CROSS/BLUE SHIELD

## 2014-08-31 ENCOUNTER — Ambulatory Visit: Payer: BLUE CROSS/BLUE SHIELD | Admitting: Nutrition

## 2014-08-31 ENCOUNTER — Ambulatory Visit: Admission: RE | Admit: 2014-08-31 | Payer: BLUE CROSS/BLUE SHIELD | Source: Ambulatory Visit

## 2014-08-31 VITALS — BP 128/73 | HR 108

## 2014-08-31 VITALS — BP 103/71 | HR 129 | Temp 99.1°F | Resp 21 | Ht 71.0 in | Wt 249.8 lb

## 2014-08-31 VITALS — BP 124/69 | HR 141 | Temp 98.1°F | Resp 20 | Wt 250.4 lb

## 2014-08-31 DIAGNOSIS — R1310 Dysphagia, unspecified: Secondary | ICD-10-CM | POA: Diagnosis not present

## 2014-08-31 DIAGNOSIS — C159 Malignant neoplasm of esophagus, unspecified: Secondary | ICD-10-CM

## 2014-08-31 DIAGNOSIS — C160 Malignant neoplasm of cardia: Secondary | ICD-10-CM | POA: Diagnosis not present

## 2014-08-31 DIAGNOSIS — C155 Malignant neoplasm of lower third of esophagus: Secondary | ICD-10-CM

## 2014-08-31 DIAGNOSIS — R0789 Other chest pain: Secondary | ICD-10-CM

## 2014-08-31 LAB — COMPREHENSIVE METABOLIC PANEL (CC13)
ALT: 19 U/L (ref 0–55)
ANION GAP: 11 meq/L (ref 3–11)
AST: 23 U/L (ref 5–34)
Albumin: 3.9 g/dL (ref 3.5–5.0)
Alkaline Phosphatase: 58 U/L (ref 40–150)
BUN: 45 mg/dL — ABNORMAL HIGH (ref 7.0–26.0)
CALCIUM: 9.7 mg/dL (ref 8.4–10.4)
CHLORIDE: 105 meq/L (ref 98–109)
CO2: 21 meq/L — AB (ref 22–29)
Creatinine: 1.6 mg/dL — ABNORMAL HIGH (ref 0.7–1.3)
EGFR: 42 mL/min/{1.73_m2} — ABNORMAL LOW (ref >90)
Glucose: 148 mg/dl — ABNORMAL HIGH (ref 70–140)
POTASSIUM: 4.6 meq/L (ref 3.5–5.1)
Sodium: 137 mEq/L (ref 136–145)
Total Bilirubin: 0.83 mg/dL (ref 0.20–1.20)
Total Protein: 7.2 g/dL (ref 6.4–8.3)

## 2014-08-31 LAB — CBC WITH DIFFERENTIAL/PLATELET
BASO%: 0.5 % (ref 0.0–2.0)
Basophils Absolute: 0 10*3/uL (ref 0.0–0.1)
EOS ABS: 0 10*3/uL (ref 0.0–0.5)
EOS%: 0.4 % (ref 0.0–7.0)
HEMATOCRIT: 45.4 % (ref 38.4–49.9)
HGB: 14.9 g/dL (ref 13.0–17.1)
LYMPH#: 0.4 10*3/uL — AB (ref 0.9–3.3)
LYMPH%: 4.4 % — AB (ref 14.0–49.0)
MCH: 28.9 pg (ref 27.2–33.4)
MCHC: 32.8 g/dL (ref 32.0–36.0)
MCV: 88 fL (ref 79.3–98.0)
MONO#: 1 10*3/uL — ABNORMAL HIGH (ref 0.1–0.9)
MONO%: 12.1 % (ref 0.0–14.0)
NEUT%: 82.6 % — ABNORMAL HIGH (ref 39.0–75.0)
NEUTROS ABS: 6.7 10*3/uL — AB (ref 1.5–6.5)
Platelets: 165 10*3/uL (ref 140–400)
RBC: 5.15 10*6/uL (ref 4.20–5.82)
RDW: 12.4 % (ref 11.0–14.6)
WBC: 8.1 10*3/uL (ref 4.0–10.3)

## 2014-08-31 MED ORDER — HYDROCODONE-ACETAMINOPHEN 7.5-325 MG/15ML PO SOLN
15.0000 mL | Freq: Once | ORAL | Status: AC
  Administered 2014-08-31: 15 mL via ORAL

## 2014-08-31 MED ORDER — FENTANYL 25 MCG/HR TD PT72: 25.0000 ug | MEDICATED_PATCH | TRANSDERMAL | Status: DC

## 2014-08-31 MED ORDER — HYDROCODONE-ACETAMINOPHEN 7.5-325 MG/15ML PO SOLN
ORAL | Status: AC
Start: 2014-08-31 — End: 2014-08-31
  Filled 2014-08-31: qty 15

## 2014-08-31 MED ORDER — HYDROCODONE-ACETAMINOPHEN 7.5-325 MG/15ML PO SOLN: 10.0000 mL | Freq: Four times a day (QID) | ORAL | Status: DC | PRN

## 2014-08-31 MED ORDER — SODIUM CHLORIDE 0.9 % IV SOLN
INTRAVENOUS | Status: AC
  Administered 2014-08-31: 13:00:00 via INTRAVENOUS

## 2014-08-31 NOTE — Telephone Encounter (Signed)
Per staff message and POF I have scheduled appts. Advised scheduler of appts. JMW  

## 2014-08-31 NOTE — Progress Notes (Signed)
Orthostatic vitals done: bp sitting 125/79, hr 126, bp standing 124/69, hr 141.  Patient has lost 4 lbs since 08/24/13.

## 2014-08-31 NOTE — Progress Notes (Addendum)
Patient being seen prior to radiation today with c/o "sternal pain" since 6:30 am today.  His wife states he vomited moderate amount "gastric juices" on the way to cancer center. He is "spitting up" clear foamy sputum in exam room. He states he feels as if he cannot get any foods down or spit up, stating "it feels like it gets stuck", indicating his mid sternal area. He took Prilosec this morning, only new medication is Carafate which he is taking prior to meals. He denies nausea. He has been drinking large amounts of whole milk, Ensure, ice cream and other milk products.

## 2014-08-31 NOTE — Progress Notes (Signed)
Brief follow up with patient and wife in infusion. Patient is receiving IVF today. Reports difficultyand painful swallowing and says he has not had any fluids for the last 24 hours. Has been spitting up "foam". Patient has worked hard to consume adequate calories and protein to meet minimum estimated needs. Weight decreased to 249 pounds from 254.6 pounds.  Estimated nutrition needs: 2300-2500 calories, 130-140 calories, 2.5 L.  Nutrition diagnosis:  Unintended weight loss continues.  Intervention: Recommend patient continue with Ensure Plus as tolerated. Patient could try ENU oral nutrition supplements to provide approximately 490 calories and 25 grams protein per 11 oz. Provided support and encouragement.   Monitoring, Evaluation, Goals: Patient will work to increase calories and protein to maintain lean body mass.  Next Visit:Tuesday, March 1 during chemotherapy.

## 2014-08-31 NOTE — Telephone Encounter (Signed)
S/w pt confirming labs added per 02/23 POF, and chemo in the am.... KJ

## 2014-08-31 NOTE — Progress Notes (Signed)
  [facility] OFFICE PROGRESS NOTE   Diagnosis: Esophagus cancer  INTERVAL HISTORY:   He returns as scheduled. He continues daily radiation and completed week 3 of Taxol/carboplatin on 08/24/2014. No nausea or neuropathy symptoms. He complains of increased solid and liquid dysphagia for the past several days. The dysphagia had initially improved with the first 2 weeks of treatment. He feels like he needs to "burp "but nothing will come up. He saw Dr. Sondra Come earlier today and radiation has been placed on hold. It has been difficult to drink liquids for the past few days.  Objective:  Vital signs in last 24 hours:  Blood pressure 103/71, pulse 129, temperature 99.1 F (37.3 C), temperature source Oral, resp. rate 21, height 5\' 11"  (4.580 m), weight 249 lb 12.8 oz (113.309 kg), SpO2 98 %.    HEENT: No thrush or ulcers Resp: Lungs clear bilaterally Cardio: Regular rate and rhythm GI: No hepatosplenomegaly, nontender Vascular: No leg edema  Skin: Diminished skin turgor    Lab Results:  Lab Results  Component Value Date   WBC 8.1 08/31/2014   HGB 14.9 08/31/2014   HCT 45.4 08/31/2014   MCV 88.0 08/31/2014   PLT 165 08/31/2014   NEUTROABS 6.7* 08/31/2014    Potassium 4.6, BUN 45, creatinine 1.6, albumin 3.9, sodium 137 Medications: I have reviewed the patient's current medications.  Assessment/Plan: 1. 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 No extension beyond the gastroesophageal junction into the proximal stomach. Previously noted borderline enlarged paraesophageal lymph  nodes showed low level metabolic activity. Multiple areas of presumably physiologic activity throughout the small bowel and colon.  Initiation of radiation and weekly Taxol/carboplatin 08/10/2014  2. Solid dysphagia secondary to #1   3. History of coronary artery disease, status post coronary artery bypass surgery in 2002   4. Diabetes   5. Hypertension   6. Hyperlipidemia   7. Aortic stenosis   8. 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 Koeppen has completed 3 weeks of treatment with radiation and chemotherapy. He was initial improvement in the dysphagia, but he now has progressive dysphagia and chest discomfort. 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 He will try hydrocodone elixir for the discomfort and we will prescribe intravenous fluids today and tomorrow. Treatment will be placed on hold until he returns for an office visit 09/02/2014.  Betsy Coder, MD  08/31/2014  2:14 PM

## 2014-08-31 NOTE — Progress Notes (Signed)
  Radiation Oncology         [phone] ________________________________  Name: [name] MRN: [id number]  Date: 08/31/2014  DOB: [birth year]  Weekly Radiation Therapy Management  DIAGNOSIS: uT3, N1, Mx adenocarcinoma of the distal esophagus/GE junction  Current Dose: 27 Gy     Planned Dose:  50.4 Gy  Narrative . . . . . . . . The patient presents for routine under treatment assessment.                                   He is seen prior to his radiation therapy. Over the past few days patient's had increasing difficulties with swallowing and he even has great difficulty swallowing water and occasionally does not feel he can swallow his saliva. He complains of pressure sensation along the sternum but no shortness of breath. He feels like he needs to burp but is unable to do so.                                 Set-up films were reviewed.                                 The chart was checked. Physical Findings. . .  weight is 250 lb 6.4 oz (113.581 kg). His oral temperature is 98.1 F (36.7 C). His blood pressure is 124/69 and his pulse is 141. His respiration is 20. Marland Kitchen Patient exhibits orthostatic blood pressure changes. the oral cavity is moist without secondary infection.  the heart has regular rhythm with increased rate. The lungs are clear. Abdomen is soft and nontender with decreased bowel sounds in light of decreased by mouth intake. Impression . . . . . . . The patient is experiencing significant side effects this time Plan . . . . . . . . . . . . The patient will be placed on break from treatment for the next 2 days. He will be reevaluated on Thursday to consider resuming his treatment. Patient did have blood work today and was set up for IV fluid supplementation. His chemotherapy will be held today. The patient's symptoms do not improve over the next couple days we may need to consider repeat endoscopy. The patient is been placed on a Duragesic patch. He also been given a prescription  for Hycet. The patient is already  on Carafate suspension.  ________________________________   Blair Promise, PhD, MD

## 2014-08-31 NOTE — Patient Instructions (Signed)
Dehydration, Adult Dehydration is when you lose more fluids from the body than you take in. Vital organs like the kidneys, brain, and heart cannot function without a proper amount of fluids and salt. Any loss of fluids from the body can cause dehydration.  CAUSES   Vomiting.  Diarrhea.  Excessive sweating.  Excessive urine output.  Fever. SYMPTOMS  Mild dehydration  Thirst.  Dry lips.  Slightly dry mouth. Moderate dehydration  Very dry mouth.  Sunken eyes.  Skin does not bounce back quickly when lightly pinched and released.  Dark urine and decreased urine production.  Decreased tear production.  Headache. Severe dehydration  Very dry mouth.  Extreme thirst.  Rapid, weak pulse (more than 100 beats per minute at rest).  Cold hands and feet.  Not able to sweat in spite of heat and temperature.  Rapid breathing.  Blue lips.  Confusion and lethargy.  Difficulty being awakened.  Minimal urine production.  No tears. DIAGNOSIS  Your caregiver will diagnose dehydration based on your symptoms and your exam. Blood and urine tests will help confirm the diagnosis. The diagnostic evaluation should also identify the cause of dehydration. TREATMENT  Treatment of mild or moderate dehydration can often be done at home by increasing the amount of fluids that you drink. It is best to drink small amounts of fluid more often. Drinking too much at one time can make vomiting worse. Refer to the home care instructions below. Severe dehydration needs to be treated at the hospital where you will probably be given intravenous (IV) fluids that contain water and electrolytes. HOME CARE INSTRUCTIONS   Ask your caregiver about specific rehydration instructions.  Drink enough fluids to keep your urine clear or pale yellow.  Drink small amounts frequently if you have nausea and vomiting.  Eat as you normally do.  Avoid:  Foods or drinks high in sugar.  Carbonated  drinks.  Juice.  Extremely hot or cold fluids.  Drinks with caffeine.  Fatty, greasy foods.  Alcohol.  Tobacco.  Overeating.  Gelatin desserts.  Wash your hands well to avoid spreading bacteria and viruses.  Only take over-the-counter or prescription medicines for pain, discomfort, or fever as directed by your caregiver.  Ask your caregiver if you should continue all prescribed and over-the-counter medicines.  Keep all follow-up appointments with your caregiver. SEEK MEDICAL CARE IF:  You have abdominal pain and it increases or stays in one area (localizes).  You have a rash, stiff neck, or severe headache.  You are irritable, sleepy, or difficult to awaken.  You are weak, dizzy, or extremely thirsty. SEEK IMMEDIATE MEDICAL CARE IF:   You are unable to keep fluids down or you get worse despite treatment.  You have frequent episodes of vomiting or diarrhea.  You have blood or green matter (bile) in your vomit.  You have blood in your stool or your stool looks black and tarry.  You have not urinated in 6 to 8 hours, or you have only urinated a small amount of very dark urine.  You have a fever.  You faint. MAKE SURE YOU:   Understand these instructions.  Will watch your condition.  Will get help right away if you are not doing well or get worse. Document Released: 06/25/2005 Document Revised: 09/17/2011 Document Reviewed: 02/12/2011 Exit[employer] Patient Information 2015 [employer]. This information is not intended to replace advice given to you by your health care provider. Make sure you discuss any questions you have with your health care   provider.  

## 2014-09-01 ENCOUNTER — Ambulatory Visit: Payer: BLUE CROSS/BLUE SHIELD

## 2014-09-01 ENCOUNTER — Ambulatory Visit (HOSPITAL_BASED_OUTPATIENT_CLINIC_OR_DEPARTMENT_OTHER): Payer: BLUE CROSS/BLUE SHIELD

## 2014-09-01 ENCOUNTER — Ambulatory Visit
Admission: RE | Admit: 2014-09-01 | Discharge: 2014-09-01 | Disposition: A | Discharge status: Home / Self Care | Payer: BLUE CROSS/BLUE SHIELD | Source: Ambulatory Visit | Attending: Radiation Oncology | Admitting: Radiation Oncology

## 2014-09-01 VITALS — BP 138/78 | HR 75 | Temp 97.8°F | Resp 20

## 2014-09-01 DIAGNOSIS — R1310 Dysphagia, unspecified: Secondary | ICD-10-CM

## 2014-09-01 DIAGNOSIS — C159 Malignant neoplasm of esophagus, unspecified: Secondary | ICD-10-CM

## 2014-09-01 DIAGNOSIS — C155 Malignant neoplasm of lower third of esophagus: Secondary | ICD-10-CM

## 2014-09-01 MED ORDER — SODIUM CHLORIDE 0.9 % IV SOLN
1500.0000 mL | INTRAVENOUS | Status: DC
  Administered 2014-09-01: 09:00:00 via INTRAVENOUS

## 2014-09-01 NOTE — Patient Instructions (Signed)
Dehydration, Adult Dehydration is when you lose more fluids from the body than you take in. Vital organs like the kidneys, brain, and heart cannot function without a proper amount of fluids and salt. Any loss of fluids from the body can cause dehydration.  CAUSES   Vomiting.  Diarrhea.  Excessive sweating.  Excessive urine output.  Fever. SYMPTOMS  Mild dehydration  Thirst.  Dry lips.  Slightly dry mouth. Moderate dehydration  Very dry mouth.  Sunken eyes.  Skin does not bounce back quickly when lightly pinched and released.  Dark urine and decreased urine production.  Decreased tear production.  Headache. Severe dehydration  Very dry mouth.  Extreme thirst.  Rapid, weak pulse (more than 100 beats per minute at rest).  Cold hands and feet.  Not able to sweat in spite of heat and temperature.  Rapid breathing.  Blue lips.  Confusion and lethargy.  Difficulty being awakened.  Minimal urine production.  No tears. DIAGNOSIS  Your caregiver will diagnose dehydration based on your symptoms and your exam. Blood and urine tests will help confirm the diagnosis. The diagnostic evaluation should also identify the cause of dehydration. TREATMENT  Treatment of mild or moderate dehydration can often be done at home by increasing the amount of fluids that you drink. It is best to drink small amounts of fluid more often. Drinking too much at one time can make vomiting worse. Refer to the home care instructions below. Severe dehydration needs to be treated at the hospital where you will probably be given intravenous (IV) fluids that contain water and electrolytes. HOME CARE INSTRUCTIONS   Ask your caregiver about specific rehydration instructions.  Drink enough fluids to keep your urine clear or pale yellow.  Drink small amounts frequently if you have nausea and vomiting.  Eat as you normally do.  Avoid:  Foods or drinks high in sugar.  Carbonated  drinks.  Juice.  Extremely hot or cold fluids.  Drinks with caffeine.  Fatty, greasy foods.  Alcohol.  Tobacco.  Overeating.  Gelatin desserts.  Wash your hands well to avoid spreading bacteria and viruses.  Only take over-the-counter or prescription medicines for pain, discomfort, or fever as directed by your caregiver.  Ask your caregiver if you should continue all prescribed and over-the-counter medicines.  Keep all follow-up appointments with your caregiver. SEEK MEDICAL CARE IF:  You have abdominal pain and it increases or stays in one area (localizes).  You have a rash, stiff neck, or severe headache.  You are irritable, sleepy, or difficult to awaken.  You are weak, dizzy, or extremely thirsty. SEEK IMMEDIATE MEDICAL CARE IF:   You are unable to keep fluids down or you get worse despite treatment.  You have frequent episodes of vomiting or diarrhea.  You have blood or green matter (bile) in your vomit.  You have blood in your stool or your stool looks black and tarry.  You have not urinated in 6 to 8 hours, or you have only urinated a small amount of very dark urine.  You have a fever.  You faint. MAKE SURE YOU:   Understand these instructions.  Will watch your condition.  Will get help right away if you are not doing well or get worse. Document Released: 06/25/2005 Document Revised: 09/17/2011 Document Reviewed: 02/12/2011 Exit[employer] Patient Information 2015 [employer]. This information is not intended to replace advice given to you by your health care provider. Make sure you discuss any questions you have with your health care   provider.  

## 2014-09-01 NOTE — Progress Notes (Signed)
Patient ambulated to with no signs or symptoms of dizziness or distress.  Post vital signs stable.  Discharged home with wife and no complaints.

## 2014-09-02 ENCOUNTER — Other Ambulatory Visit (HOSPITAL_BASED_OUTPATIENT_CLINIC_OR_DEPARTMENT_OTHER): Payer: BLUE CROSS/BLUE SHIELD

## 2014-09-02 ENCOUNTER — Ambulatory Visit (HOSPITAL_BASED_OUTPATIENT_CLINIC_OR_DEPARTMENT_OTHER): Payer: BLUE CROSS/BLUE SHIELD | Admitting: Oncology

## 2014-09-02 ENCOUNTER — Ambulatory Visit: Payer: BLUE CROSS/BLUE SHIELD

## 2014-09-02 ENCOUNTER — Telehealth: Payer: Self-pay | Admitting: *Deleted

## 2014-09-02 ENCOUNTER — Telehealth: Payer: Self-pay | Admitting: Oncology

## 2014-09-02 ENCOUNTER — Ambulatory Visit
Admission: RE | Admit: 2014-09-02 | Discharge: 2014-09-02 | Disposition: A | Discharge status: Home / Self Care | Payer: BLUE CROSS/BLUE SHIELD | Source: Ambulatory Visit | Attending: Radiation Oncology | Admitting: Radiation Oncology

## 2014-09-02 ENCOUNTER — Encounter: Payer: Self-pay | Admitting: Radiation Oncology

## 2014-09-02 ENCOUNTER — Ambulatory Visit (HOSPITAL_BASED_OUTPATIENT_CLINIC_OR_DEPARTMENT_OTHER): Payer: BLUE CROSS/BLUE SHIELD

## 2014-09-02 VITALS — BP 138/66 | HR 80 | Temp 97.9°F | Resp 18 | Ht 71.0 in | Wt 248.6 lb

## 2014-09-02 VITALS — BP 134/64 | HR 71 | Temp 97.7°F | Resp 16

## 2014-09-02 DIAGNOSIS — C155 Malignant neoplasm of lower third of esophagus: Secondary | ICD-10-CM

## 2014-09-02 DIAGNOSIS — C159 Malignant neoplasm of esophagus, unspecified: Secondary | ICD-10-CM

## 2014-09-02 DIAGNOSIS — Z510 Encounter for antineoplastic radiation therapy: Secondary | ICD-10-CM | POA: Diagnosis not present

## 2014-09-02 DIAGNOSIS — R1310 Dysphagia, unspecified: Secondary | ICD-10-CM

## 2014-09-02 DIAGNOSIS — Z5111 Encounter for antineoplastic chemotherapy: Secondary | ICD-10-CM

## 2014-09-02 LAB — BASIC METABOLIC PANEL (CC13)
Anion Gap: 10 mEq/L (ref 3–11)
BUN: 22.2 mg/dL (ref 7.0–26.0)
CO2: 22 mEq/L (ref 22–29)
Calcium: 9.7 mg/dL (ref 8.4–10.4)
Chloride: 106 mEq/L (ref 98–109)
Creatinine: 1.1 mg/dL (ref 0.7–1.3)
EGFR: 66 mL/min/{1.73_m2} — ABNORMAL LOW (ref >90)
Glucose: 130 mg/dl (ref 70–140)
Potassium: 4.4 mEq/L (ref 3.5–5.1)
Sodium: 138 mEq/L (ref 136–145)

## 2014-09-02 LAB — CBC WITH DIFFERENTIAL/PLATELET
BASO%: 0.9 % (ref 0.0–2.0)
Basophils Absolute: 0.1 10*3/uL (ref 0.0–0.1)
EOS%: 0.7 % (ref 0.0–7.0)
Eosinophils Absolute: 0 10*3/uL (ref 0.0–0.5)
HCT: 40.5 % (ref 38.4–49.9)
HGB: 13.3 g/dL (ref 13.0–17.1)
LYMPH%: 3.8 % — ABNORMAL LOW (ref 14.0–49.0)
MCH: 28.9 pg (ref 27.2–33.4)
MCHC: 32.9 g/dL (ref 32.0–36.0)
MCV: 87.8 fL (ref 79.3–98.0)
MONO#: 0.9 10*3/uL (ref 0.1–0.9)
MONO%: 14.4 % — ABNORMAL HIGH (ref 0.0–14.0)
NEUT#: 5 10*3/uL (ref 1.5–6.5)
NEUT%: 80.2 % — ABNORMAL HIGH (ref 39.0–75.0)
Platelets: 150 10*3/uL (ref 140–400)
RBC: 4.62 10*6/uL (ref 4.20–5.82)
RDW: 12.6 % (ref 11.0–14.6)
WBC: 6.3 10*3/uL (ref 4.0–10.3)
lymph#: 0.2 10*3/uL — ABNORMAL LOW (ref 0.9–3.3)

## 2014-09-02 MED ORDER — PACLITAXEL CHEMO INJECTION 300 MG/50ML
45.0000 mg/m2 | Freq: Once | INTRAVENOUS | Status: AC
  Administered 2014-09-02: 108 mg via INTRAVENOUS
  Filled 2014-09-02: qty 18

## 2014-09-02 MED ORDER — FAMOTIDINE IN NACL 20-0.9 MG/50ML-% IV SOLN
INTRAVENOUS | Status: AC
  Filled 2014-09-02: qty 50

## 2014-09-02 MED ORDER — SODIUM CHLORIDE 0.9 % IV SOLN
220.0000 mg | Freq: Once | INTRAVENOUS | Status: AC
  Administered 2014-09-02: 220 mg via INTRAVENOUS
  Filled 2014-09-02: qty 22

## 2014-09-02 MED ORDER — DEXAMETHASONE SODIUM PHOSPHATE 10 MG/ML IJ SOLN
INTRAMUSCULAR | Status: AC
  Filled 2014-09-02: qty 1

## 2014-09-02 MED ORDER — DIPHENHYDRAMINE HCL 50 MG/ML IJ SOLN
INTRAMUSCULAR | Status: AC
Start: 2014-09-02 — End: 2014-09-02
  Filled 2014-09-02: qty 1

## 2014-09-02 MED ORDER — SODIUM CHLORIDE 0.9 % IV SOLN
Freq: Once | INTRAVENOUS | Status: AC
  Administered 2014-09-02: 10:00:00 via INTRAVENOUS

## 2014-09-02 MED ORDER — DIPHENHYDRAMINE HCL 50 MG/ML IJ SOLN
25.0000 mg | Freq: Once | INTRAMUSCULAR | Status: AC
  Administered 2014-09-02: 25 mg via INTRAVENOUS

## 2014-09-02 MED ORDER — SUCRALFATE 1 GM/10ML PO SUSP: 1.0000 g | Freq: Three times a day (TID) | ORAL | Status: DC

## 2014-09-02 MED ORDER — DEXAMETHASONE SODIUM PHOSPHATE 20 MG/5ML IJ SOLN
INTRAMUSCULAR | Status: AC
  Filled 2014-09-02: qty 5

## 2014-09-02 MED ORDER — FAMOTIDINE IN NACL 20-0.9 MG/50ML-% IV SOLN
20.0000 mg | Freq: Once | INTRAVENOUS | Status: AC
  Administered 2014-09-02: 20 mg via INTRAVENOUS

## 2014-09-02 MED ORDER — ONDANSETRON 16 MG/50ML IVPB (CHCC)
16.0000 mg | Freq: Once | INTRAVENOUS | Status: AC
  Administered 2014-09-02: 16 mg via INTRAVENOUS

## 2014-09-02 MED ORDER — DEXAMETHASONE SODIUM PHOSPHATE 10 MG/ML IJ SOLN
10.0000 mg | Freq: Once | INTRAMUSCULAR | Status: AC
  Administered 2014-09-02: 10 mg via INTRAVENOUS

## 2014-09-02 MED ORDER — ONDANSETRON 16 MG/50ML IVPB (CHCC)
INTRAVENOUS | Status: AC
  Filled 2014-09-02: qty 16

## 2014-09-02 NOTE — Progress Notes (Signed)
Doren Custard reports his discomfort in his chest and throat at a 7/10.  He is using a fentanyl patch, hycet and Carafate.  He reports he feels a little better.  He had chemotherapy today.  His weight is down 2 lbs since Tuesday.  He reports being able to drink an ensure, orange juice and a milk shake yesterday.  He also ate a ritz cracker, vegistraws and a small kit kat bar.  He reports this morning he brought up a lot of saliva and then had a large amount of air come out.  He is wondering if he can take gas-x.  He felt a lot better after the air came out.    BP 134/64 mmHg  Pulse 71  Temp(Src) 97.7 F (36.5 C) (Oral)  Resp 16  SpO2 100%

## 2014-09-02 NOTE — Patient Instructions (Signed)
[facility] Discharge Instructions for Patients Receiving Chemotherapy  Today you received the following chemotherapy agents Paclitaxel/Carboplatin.   To help prevent nausea and vomiting after your treatment, we encourage you to take your nausea medication as directed.    If you develop nausea and vomiting that is not controlled by your nausea medication, call the clinic.   BELOW ARE SYMPTOMS THAT SHOULD BE REPORTED IMMEDIATELY:  *FEVER GREATER THAN 100.5 F  *CHILLS WITH OR WITHOUT FEVER  NAUSEA AND VOMITING THAT IS NOT CONTROLLED WITH YOUR NAUSEA MEDICATION  *UNUSUAL SHORTNESS OF BREATH  *UNUSUAL BRUISING OR BLEEDING  TENDERNESS IN MOUTH AND THROAT WITH OR WITHOUT PRESENCE OF ULCERS  *URINARY PROBLEMS  *BOWEL PROBLEMS  UNUSUAL RASH Items with * indicate a potential emergency and should be followed up as soon as possible.  Feel free to call the clinic you have any questions or concerns. The clinic phone number is [phone].    

## 2014-09-02 NOTE — Telephone Encounter (Signed)
Per staff message and POF I have scheduled appts. Advised scheduler of appts. JMW  

## 2014-09-02 NOTE — Telephone Encounter (Signed)
Labs/ov/chemo moved to 03/03 per 02/25 POF, sent msg to r/s chemo to 03/03 from 03/01 and give pt a copy of schedule before he leaves chemo.Marland KitchenMarland KitchenMarland Kitchen

## 2014-09-02 NOTE — Progress Notes (Signed)
  Radiation Oncology         (336) [phone] ________________________________  Name: [name] MRN: [id number]  Date: 09/02/2014  DOB: 1941/01/10  Weekly Radiation Therapy Management  DIAGNOSIS: uT3, N1, Mx adenocarcinoma of the distal esophagus/GE junction  Current Dose: 28.8 Gy     Planned Dose:  50.4 Gy  Narrative . . . . . . . . The patient presents for assessment prior to restarting his radiation therapy. He is been on a short break in light of his symptoms. He did have IV fluid supplementation twice. He is also placed on Duragesic patch and hydrocodone elixir.  he reports feeling better he is able to swallow liquids if he moves slowly. He did have episode of any terms the gas in his chest. He did have some emesis but then felt much best better afterwards. Patient feels as if he needs to burp to improve his symptoms. I recommended he try small amount of carbonated beverage to see if this may help with his symptomatology.  BUN is much improved after his IV hydration.                                                                    Set-up films were reviewed.                                 The chart was checked. Physical Findings. . .  oral temperature is 97.7 F (36.5 C). His blood pressure is 134/64 and his pulse is 71. His respiration is 16 and oxygen saturation is 100%. . The lungs are clear. The heart has a regular rhythm and rate. The abdomen is soft and nontender with normal bowel sounds. Impression . . . . . . . The patient is doing better with this short break and pain medication initiation. 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 Plan . . . . . . . . . . . . Continue treatment as planned. He did receive additional chemotherapy earlier today.  ________________________________   Blair Promise, PhD, MD

## 2014-09-02 NOTE — Progress Notes (Signed)
**[name] [name]**   [name] [name]   Diagnosis: Esophagus cancer  INTERVAL HISTORY:   [name] [name] returns as scheduled. He received intravenous fluids each of the past 2 days. He reports feeling better. He was able to tolerate liquids, a milkshake, and chips yesterday. This morning he had an episode of pressure in the upper anterior chest followed by regurgitation of "foam "like material. He now feels "8 out of 10 ".  Objective:  Vital signs in last 24 hours:  Blood pressure 138/66, pulse 80, temperature 97.9 F (36.6 C), temperature source Oral, resp. rate 18, height 5\' 11"  (1.803 m), weight 248 lb 9.6 oz (112.764 kg), SpO2 100 %.    HEENT: No thrush or ulcers Resp: Lungs clear bilaterally Cardio: Regular rate and rhythm GI: No hepatosplenomegaly, nontender Vascular: No leg edema  Skin: Improved skin turgor    Lab Results:  Lab Results  Component Value Date   WBC 6.3 09/02/2014   HGB 13.3 09/02/2014   HCT 40.5 09/02/2014   MCV 87.8 09/02/2014   PLT 150 09/02/2014   NEUTROABS 5.0 09/02/2014   BUN 22.2, creatinine 1.1  Medications: I have reviewed the patient's current medications.  Assessment/Plan: 1. 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 No extension beyond the gastroesophageal junction into the proximal stomach. Previously noted borderline enlarged paraesophageal lymph nodes showed low level metabolic activity. Multiple areas of presumably physiologic activity throughout the small bowel and colon.  Initiation of radiation and weekly Taxol/carboplatin  08/10/2014  2. Solid dysphagia secondary to #1   3. History of coronary artery disease, status post coronary artery bypass surgery in 2002   4. Diabetes   5. Hypertension   6. Hyperlipidemia   7. Aortic stenosis   8. 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 I discussed the case with Dr. Sondra Come. The plan is to proceed with chemotherapy/radiation. He will continue the low-dose Duragesic patch and as needed hydrocodone. He will let us know if he is unable to tolerate liquids.  [name] [name] will return for an office visit and the final planned cycle of chemotherapy in one week.  Betsy Coder, MD  09/02/2014  9:16 AM

## 2014-09-03 ENCOUNTER — Ambulatory Visit: Payer: BLUE CROSS/BLUE SHIELD

## 2014-09-03 ENCOUNTER — Ambulatory Visit
Admission: RE | Admit: 2014-09-03 | Discharge: 2014-09-03 | Disposition: A | Discharge status: Home / Self Care | Payer: BLUE CROSS/BLUE SHIELD | Source: Ambulatory Visit | Attending: Radiation Oncology | Admitting: Radiation Oncology

## 2014-09-03 DIAGNOSIS — Z510 Encounter for antineoplastic radiation therapy: Secondary | ICD-10-CM | POA: Diagnosis not present

## 2014-09-05 ENCOUNTER — Other Ambulatory Visit: Payer: Self-pay | Admitting: Oncology

## 2014-09-06 ENCOUNTER — Ambulatory Visit: Payer: BLUE CROSS/BLUE SHIELD

## 2014-09-06 ENCOUNTER — Ambulatory Visit
Admission: RE | Admit: 2014-09-06 | Discharge: 2014-09-06 | Disposition: A | Discharge status: Home / Self Care | Payer: BLUE CROSS/BLUE SHIELD | Source: Ambulatory Visit | Attending: Radiation Oncology | Admitting: Radiation Oncology

## 2014-09-06 DIAGNOSIS — Z510 Encounter for antineoplastic radiation therapy: Secondary | ICD-10-CM | POA: Diagnosis not present

## 2014-09-07 ENCOUNTER — Ambulatory Visit
Admission: RE | Admit: 2014-09-07 | Discharge: 2014-09-07 | Disposition: A | Discharge status: Home / Self Care | Payer: BLUE CROSS/BLUE SHIELD | Source: Ambulatory Visit | Attending: Radiation Oncology | Admitting: Radiation Oncology

## 2014-09-07 ENCOUNTER — Ambulatory Visit: Payer: BLUE CROSS/BLUE SHIELD

## 2014-09-07 ENCOUNTER — Other Ambulatory Visit: Payer: BLUE CROSS/BLUE SHIELD

## 2014-09-07 ENCOUNTER — Other Ambulatory Visit: Payer: Self-pay | Admitting: Oncology

## 2014-09-07 ENCOUNTER — Encounter: Payer: BLUE CROSS/BLUE SHIELD | Admitting: Nutrition

## 2014-09-07 ENCOUNTER — Encounter: Payer: Self-pay | Admitting: Radiation Oncology

## 2014-09-07 VITALS — BP 149/66 | HR 82 | Temp 98.4°F | Resp 12 | Ht 71.0 in | Wt 248.2 lb

## 2014-09-07 DIAGNOSIS — C159 Malignant neoplasm of esophagus, unspecified: Secondary | ICD-10-CM | POA: Insufficient documentation

## 2014-09-07 DIAGNOSIS — Z510 Encounter for antineoplastic radiation therapy: Secondary | ICD-10-CM | POA: Diagnosis present

## 2014-09-07 MED ORDER — RADIAPLEXRX EX GEL
Freq: Once | CUTANEOUS | Status: AC
  Administered 2014-09-07: 12:00:00 via TOPICAL

## 2014-09-07 NOTE — Addendum Note (Signed)
Encounter addended by: Jacqulyn Liner, RN on: 09/07/2014 11:57 AM<BR>     Documentation filed: Dx Association, Medications, Orders

## 2014-09-07 NOTE — Progress Notes (Signed)
  Radiation Oncology         [phone] ________________________________  Name: [name] MRN: [id number]  Date: 09/07/2014  DOB: 1940/11/10  Weekly Radiation Therapy Management  Current Dose: 34.2 Gy     Planned Dose:  50.4 Gy  Narrative . . . . . . . . The patient presents for routine under treatment assessment.                                   The patient is doing better this week. He is consuming only liquids now but is taking in 3 ensures per day and 2 Carnation instant breakfast this per day weight loss is 1/2 pound over the past week. He denies any dizziness with standing. He looks much more comfortable today compared to last week.                                 Set-up films were reviewed.                                 The chart was checked. Physical Findings. . .  height is 5\' 11"  (1.803 m) and weight is 248 lb 3.2 oz (112.583 kg). His oral temperature is 98.4 F (36.9 C). His blood pressure is 149/66 and his pulse is 82. His respiration is 12 and oxygen saturation is 97%. . The oral cavity is moist without secondary infection. The lungs are clear to auscultation. The heart has a regular rhythm and rate. The abdomen is soft and nontender with normal bowel sounds. Impression . . . . . . . The patient is tolerating radiation. Plan . . . . . . . . . . . . Continue treatment as planned. Additional chemotherapy later this week. The patient is inquiring about additional IV fluid supplementation this week and will check with medical oncology concerning this issue so the patient is well hydrated for chemotherapy.  ________________________________   Blair Promise, PhD, MD

## 2014-09-07 NOTE — Addendum Note (Signed)
Encounter addended by: Jacqulyn Liner, RN on: 09/07/2014 11:58 AM<BR>     Documentation filed: Inpatient MAR

## 2014-09-07 NOTE — Progress Notes (Signed)
[name] has completed 19 fractions to his esophagus.  He denies pain today. He is using the fentanyl patch, hycet 2 times a day if needed and Carafate 4 times a day.  He reports his swallowing varies from day to day.  He feels a tightness in his chest.  He reports it is worse in the morning.  He reports that he is only eating liquids now and is drinking 3 ensures and 2 carnation instant breakfast shakes per day along with frozen ice pops and soup.  His weight is down 1/2 a lb from last week.  He had chemotherapy on Thursday and will have it again this Thursday.  He reports fatigue. His upper chest is slightly red.  He has been given radiaplex gel and was instructed to apply it twice a day after treatment and bedtime.  BP 149/66 mmHg  Pulse 82  Temp(Src) 98.4 F (36.9 C) (Oral)  Resp 12  Ht 5\' 11"  (1.803 m)  Wt 248 lb 3.2 oz (112.583 kg)  BMI 34.63 kg/m2  SpO2 97%

## 2014-09-08 ENCOUNTER — Ambulatory Visit: Payer: BLUE CROSS/BLUE SHIELD

## 2014-09-08 ENCOUNTER — Ambulatory Visit
Admission: RE | Admit: 2014-09-08 | Discharge: 2014-09-08 | Disposition: A | Discharge status: Home / Self Care | Payer: BLUE CROSS/BLUE SHIELD | Source: Ambulatory Visit | Attending: Radiation Oncology | Admitting: Radiation Oncology

## 2014-09-08 VITALS — BP 143/70 | HR 85 | Temp 97.7°F

## 2014-09-08 DIAGNOSIS — Z510 Encounter for antineoplastic radiation therapy: Secondary | ICD-10-CM | POA: Diagnosis not present

## 2014-09-08 DIAGNOSIS — C159 Malignant neoplasm of esophagus, unspecified: Secondary | ICD-10-CM

## 2014-09-08 MED ORDER — SODIUM CHLORIDE 0.9 % IV SOLN: INTRAVENOUS | Status: AC

## 2014-09-08 NOTE — Patient Instructions (Signed)

## 2014-09-09 ENCOUNTER — Ambulatory Visit (HOSPITAL_BASED_OUTPATIENT_CLINIC_OR_DEPARTMENT_OTHER): Payer: BLUE CROSS/BLUE SHIELD | Admitting: Nurse Practitioner

## 2014-09-09 ENCOUNTER — Ambulatory Visit: Payer: BLUE CROSS/BLUE SHIELD

## 2014-09-09 ENCOUNTER — Ambulatory Visit (HOSPITAL_BASED_OUTPATIENT_CLINIC_OR_DEPARTMENT_OTHER): Payer: BLUE CROSS/BLUE SHIELD

## 2014-09-09 ENCOUNTER — Other Ambulatory Visit (HOSPITAL_BASED_OUTPATIENT_CLINIC_OR_DEPARTMENT_OTHER): Payer: BLUE CROSS/BLUE SHIELD

## 2014-09-09 ENCOUNTER — Telehealth: Payer: Self-pay | Admitting: Oncology

## 2014-09-09 ENCOUNTER — Other Ambulatory Visit: Payer: Self-pay | Admitting: *Deleted

## 2014-09-09 ENCOUNTER — Ambulatory Visit
Admission: RE | Admit: 2014-09-09 | Discharge: 2014-09-09 | Disposition: A | Discharge status: Home / Self Care | Payer: BLUE CROSS/BLUE SHIELD | Source: Ambulatory Visit | Attending: Radiation Oncology | Admitting: Radiation Oncology

## 2014-09-09 ENCOUNTER — Ambulatory Visit: Payer: BLUE CROSS/BLUE SHIELD | Admitting: Nutrition

## 2014-09-09 ENCOUNTER — Telehealth: Payer: Self-pay | Admitting: *Deleted

## 2014-09-09 VITALS — BP 164/92 | HR 97 | Temp 98.5°F | Resp 20 | Ht 71.0 in | Wt 246.3 lb

## 2014-09-09 DIAGNOSIS — C159 Malignant neoplasm of esophagus, unspecified: Secondary | ICD-10-CM

## 2014-09-09 DIAGNOSIS — E119 Type 2 diabetes mellitus without complications: Secondary | ICD-10-CM

## 2014-09-09 DIAGNOSIS — I10 Essential (primary) hypertension: Secondary | ICD-10-CM

## 2014-09-09 DIAGNOSIS — C155 Malignant neoplasm of lower third of esophagus: Secondary | ICD-10-CM

## 2014-09-09 DIAGNOSIS — I25810 Atherosclerosis of coronary artery bypass graft(s) without angina pectoris: Secondary | ICD-10-CM

## 2014-09-09 DIAGNOSIS — Z5111 Encounter for antineoplastic chemotherapy: Secondary | ICD-10-CM

## 2014-09-09 DIAGNOSIS — I350 Nonrheumatic aortic (valve) stenosis: Secondary | ICD-10-CM

## 2014-09-09 DIAGNOSIS — R1310 Dysphagia, unspecified: Secondary | ICD-10-CM

## 2014-09-09 DIAGNOSIS — Z510 Encounter for antineoplastic radiation therapy: Secondary | ICD-10-CM | POA: Diagnosis not present

## 2014-09-09 LAB — COMPREHENSIVE METABOLIC PANEL (CC13)
ALBUMIN: 3.7 g/dL (ref 3.5–5.0)
ALK PHOS: 58 U/L (ref 40–150)
ALT: 24 U/L (ref 0–55)
AST: 27 U/L (ref 5–34)
Anion Gap: 7 mEq/L (ref 3–11)
BUN: 17.4 mg/dL (ref 7.0–26.0)
CHLORIDE: 106 meq/L (ref 98–109)
CO2: 27 mEq/L (ref 22–29)
CREATININE: 1.2 mg/dL (ref 0.7–1.3)
Calcium: 9.8 mg/dL (ref 8.4–10.4)
EGFR: 60 mL/min/{1.73_m2} — ABNORMAL LOW (ref >90)
Glucose: 117 mg/dl (ref 70–140)
Potassium: 4.9 mEq/L (ref 3.5–5.1)
Sodium: 141 mEq/L (ref 136–145)
Total Bilirubin: 0.88 mg/dL (ref 0.20–1.20)
Total Protein: 6.9 g/dL (ref 6.4–8.3)

## 2014-09-09 LAB — CBC WITH DIFFERENTIAL/PLATELET
BASO%: 0.5 % (ref 0.0–2.0)
Basophils Absolute: 0 10*3/uL (ref 0.0–0.1)
EOS%: 0.7 % (ref 0.0–7.0)
Eosinophils Absolute: 0 10*3/uL (ref 0.0–0.5)
HCT: 38.8 % (ref 38.4–49.9)
HEMOGLOBIN: 13.6 g/dL (ref 13.0–17.1)
LYMPH#: 0.2 10*3/uL — AB (ref 0.9–3.3)
LYMPH%: 2.8 % — ABNORMAL LOW (ref 14.0–49.0)
MCH: 30.2 pg (ref 27.2–33.4)
MCHC: 35.1 g/dL (ref 32.0–36.0)
MCV: 86 fL (ref 79.3–98.0)
MONO#: 0.8 10*3/uL (ref 0.1–0.9)
MONO%: 14.2 % — ABNORMAL HIGH (ref 0.0–14.0)
NEUT#: 4.7 10*3/uL (ref 1.5–6.5)
NEUT%: 81.8 % — ABNORMAL HIGH (ref 39.0–75.0)
Platelets: 113 10*3/uL — ABNORMAL LOW (ref 140–400)
RBC: 4.51 10*6/uL (ref 4.20–5.82)
RDW: 12.8 % (ref 11.0–14.6)
WBC: 5.7 10*3/uL (ref 4.0–10.3)

## 2014-09-09 MED ORDER — FAMOTIDINE IN NACL 20-0.9 MG/50ML-% IV SOLN
20.0000 mg | Freq: Once | INTRAVENOUS | Status: AC
  Administered 2014-09-09: 20 mg via INTRAVENOUS

## 2014-09-09 MED ORDER — SODIUM CHLORIDE 0.9 % IV SOLN
1000.0000 mL | Freq: Once | INTRAVENOUS | Status: AC
  Administered 2014-09-09: 1000 mL via INTRAVENOUS

## 2014-09-09 MED ORDER — FAMOTIDINE IN NACL 20-0.9 MG/50ML-% IV SOLN
INTRAVENOUS | Status: AC
  Filled 2014-09-09: qty 50

## 2014-09-09 MED ORDER — ONDANSETRON 16 MG/50ML IVPB (CHCC)
INTRAVENOUS | Status: AC
  Filled 2014-09-09: qty 16

## 2014-09-09 MED ORDER — SODIUM CHLORIDE 0.9 % IV SOLN
Freq: Once | INTRAVENOUS | Status: AC
  Administered 2014-09-09: 14:00:00 via INTRAVENOUS

## 2014-09-09 MED ORDER — DIPHENHYDRAMINE HCL 50 MG/ML IJ SOLN
INTRAMUSCULAR | Status: AC
  Filled 2014-09-09: qty 1

## 2014-09-09 MED ORDER — SODIUM CHLORIDE 0.9 % IV SOLN
222.0000 mg | Freq: Once | INTRAVENOUS | Status: AC
  Administered 2014-09-09: 220 mg via INTRAVENOUS
  Filled 2014-09-09: qty 22

## 2014-09-09 MED ORDER — DEXAMETHASONE SODIUM PHOSPHATE 10 MG/ML IJ SOLN
INTRAMUSCULAR | Status: AC
  Filled 2014-09-09: qty 1

## 2014-09-09 MED ORDER — ONDANSETRON 16 MG/50ML IVPB (CHCC)
16.0000 mg | Freq: Once | INTRAVENOUS | Status: AC
  Administered 2014-09-09: 16 mg via INTRAVENOUS

## 2014-09-09 MED ORDER — DIPHENHYDRAMINE HCL 50 MG/ML IJ SOLN
25.0000 mg | Freq: Once | INTRAMUSCULAR | Status: AC
  Administered 2014-09-09: 25 mg via INTRAVENOUS

## 2014-09-09 MED ORDER — PACLITAXEL CHEMO INJECTION 300 MG/50ML
45.0000 mg/m2 | Freq: Once | INTRAVENOUS | Status: AC
  Administered 2014-09-09: 108 mg via INTRAVENOUS
  Filled 2014-09-09: qty 18

## 2014-09-09 MED ORDER — DEXAMETHASONE SODIUM PHOSPHATE 10 MG/ML IJ SOLN
10.0000 mg | Freq: Once | INTRAMUSCULAR | Status: AC
  Administered 2014-09-09: 10 mg via INTRAVENOUS

## 2014-09-09 NOTE — Telephone Encounter (Signed)
S/w pt in chemo room scheduled DG Esophagus on 03/11 but ask if he wanted to do on Tuesday after his visit with Dr. Sondra Come and give him a little break before his desk, pt was fine and knows that he can't have food/drink 3 hours prior to test. I advised pt I would call radiology back to r/s and send him a msg through [facility] so he will know if there is a change. S/w radiology and r/s for 03/08 @12 :30 pt to arrive @12 :15, sent msg to mychart for update..... KJ

## 2014-09-09 NOTE — Progress Notes (Addendum)
  [name]   Diagnosis:  Esophagus cancer  INTERVAL HISTORY:   [name] returns as scheduled. He continues radiation and Xeloda. He is again intermittently experiencing a "pressure" in the upper anterior chest followed by regurgitation of "foamy" material. He does not have significant pain with swallowing. Oral intake is limited though he is able to tolerate some liquids. He denies nausea/vomiting. Bowels are moving. No numbness or tingling in his hands or feet.  Objective:  Vital signs in last 24 hours:  Blood pressure 164/92, pulse 97, temperature 98.5 F (36.9 C), temperature source Oral, resp. rate 20, height 5\' 11"  (1.803 m), weight 246 lb 4.8 oz (111.721 kg).    HEENT: No thrush or ulcers. Resp: Lungs clear bilaterally. Cardio: Regular rate and rhythm. GI: Abdomen soft and nontender. No organomegaly. Vascular: No leg edema.  Skin: Mildly decreased skin turgor.    Lab Results:  Lab Results  Component Value Date   WBC 5.7 09/09/2014   HGB 13.6 09/09/2014   HCT 38.8 09/09/2014   MCV 86.0 09/09/2014   PLT 113* 09/09/2014   NEUTROABS 4.7 09/09/2014    Imaging:  No results found.  Medications: I have reviewed the patient's current medications.  Assessment/Plan: 1. 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 No extension beyond the gastroesophageal junction into the proximal stomach. Previously noted borderline enlarged paraesophageal lymph nodes showed low level metabolic activity. Multiple areas of presumably physiologic activity throughout the  small bowel and colon.  Initiation of radiation and weekly Taxol/carboplatin 08/10/2014  2. Solid dysphagia secondary to #1   3. History of coronary artery disease, status post coronary artery bypass surgery in 2002   4. Diabetes   5. Hypertension   6. Hyperlipidemia   7. Aortic stenosis   8. 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 The etiology is unclear. Question related to radiation esophagitis, question esophageal spasm, question cancer. He is having difficulty tolerating liquids. Dr. Benay Spice is contacting Dr. Hilarie Fredrickson to discuss proceeding with a barium swallow versus EGD. In the meantime we will schedule him to receive daily IV fluids. He will continue liquids as tolerated.  The plan is to proceed with chemotherapy/radiation as scheduled.  We will see him in follow-up in one week.  Patient seen with Dr. Benay Spice. 25 minutes were spent face-to-face at today's visit with the majority of that time involved in counseling/coordination of care.    Ned Card ANP/GNP-BC   09/09/2014  12:23 PM  This was a shared visit with Ned Card. He continues to have dysphagia. It is unclear whether this is related to toxicity from treatment or esophageal obstruction. I contacted Dr. Ardis Hughs. He suggested a barium esophagram. We will schedule this within the next 2 days. [name] will continue radiation. He will be scheduled for daily IV fluids. He will complete a final cycle of chemotherapy today.  Julieanne Manson, M.D.

## 2014-09-09 NOTE — Telephone Encounter (Signed)
gv adn prnted appt sched and avs for pt for march....MW added tx.

## 2014-09-09 NOTE — Progress Notes (Signed)
Nutrition follow-up with patient and wife, during infusion.  Patient receiving chemoradiation therapy for esophageal cancer. Patient is having a "bad day" and has been unable to eat or drink. He continues to spit up "foam".  M.D. is aware. Patient does not have a feeding tube. Weight decreased to 246 pounds from 254.6 pounds February 16. Patient completes radiation therapy March 14.  Nutrition diagnosis: Unintended weight loss continues.  Intervention: Provided support and encouragement to patient and wife. Encouraged patient to increase oral intake when able.  Monitoring, evaluation, goals: Patient will work to increase oral intake to minimize weight loss.  Next visit: Friday, March 11, during infusion.  **Disclaimer: This note was dictated with voice recognition software. Similar sounding words can inadvertently be transcribed and this note may contain transcription errors which may not have been corrected upon publication of note.**

## 2014-09-09 NOTE — Patient Instructions (Signed)
[facility] Discharge Instructions for Patients Receiving Chemotherapy  Today you received the following chemotherapy agents: Taxol and Carboplatin.  To help prevent nausea and vomiting after your treatment, we encourage you to take your nausea medication as prescribed.   If you develop nausea and vomiting that is not controlled by your nausea medication, call the clinic.   BELOW ARE SYMPTOMS THAT SHOULD BE REPORTED IMMEDIATELY:  *FEVER GREATER THAN 100.5 F  *CHILLS WITH OR WITHOUT FEVER  NAUSEA AND VOMITING THAT IS NOT CONTROLLED WITH YOUR NAUSEA MEDICATION  *UNUSUAL SHORTNESS OF BREATH  *UNUSUAL BRUISING OR BLEEDING  TENDERNESS IN MOUTH AND THROAT WITH OR WITHOUT PRESENCE OF ULCERS  *URINARY PROBLEMS  *BOWEL PROBLEMS  UNUSUAL RASH Items with * indicate a potential emergency and should be followed up as soon as possible.  Feel free to call the clinic you have any questions or concerns. The clinic phone number is [phone].    

## 2014-09-09 NOTE — Telephone Encounter (Signed)
Barium Esophogram has been rescheduled to March 4 at 2pm. Pt must remain NPO for 3 hours prior.  Called pt and LM to notify of appt change left information about nothing to eat of drink after 11am tomorrow- 3/4.  Asked to return call if any questions.

## 2014-09-10 ENCOUNTER — Other Ambulatory Visit: Payer: Self-pay | Admitting: Nurse Practitioner

## 2014-09-10 ENCOUNTER — Other Ambulatory Visit: Payer: Self-pay | Admitting: Oncology

## 2014-09-10 ENCOUNTER — Ambulatory Visit: Payer: BLUE CROSS/BLUE SHIELD

## 2014-09-10 ENCOUNTER — Ambulatory Visit (HOSPITAL_BASED_OUTPATIENT_CLINIC_OR_DEPARTMENT_OTHER): Payer: BLUE CROSS/BLUE SHIELD

## 2014-09-10 ENCOUNTER — Ambulatory Visit (HOSPITAL_COMMUNITY)
Admission: RE | Admit: 2014-09-10 | Discharge: 2014-09-10 | Disposition: A | Discharge status: Home / Self Care | Payer: BLUE CROSS/BLUE SHIELD | Source: Ambulatory Visit | Attending: Oncology | Admitting: Oncology

## 2014-09-10 ENCOUNTER — Ambulatory Visit
Admission: RE | Admit: 2014-09-10 | Discharge: 2014-09-10 | Disposition: A | Discharge status: Home / Self Care | Payer: BLUE CROSS/BLUE SHIELD | Source: Ambulatory Visit | Attending: Radiation Oncology | Admitting: Radiation Oncology

## 2014-09-10 DIAGNOSIS — C159 Malignant neoplasm of esophagus, unspecified: Secondary | ICD-10-CM

## 2014-09-10 DIAGNOSIS — R1310 Dysphagia, unspecified: Secondary | ICD-10-CM | POA: Insufficient documentation

## 2014-09-10 DIAGNOSIS — C155 Malignant neoplasm of lower third of esophagus: Secondary | ICD-10-CM

## 2014-09-10 DIAGNOSIS — Z510 Encounter for antineoplastic radiation therapy: Secondary | ICD-10-CM | POA: Diagnosis not present

## 2014-09-10 DIAGNOSIS — E860 Dehydration: Secondary | ICD-10-CM

## 2014-09-10 MED ORDER — SODIUM CHLORIDE 0.9 % IV SOLN
1000.0000 mL | INTRAVENOUS | Status: DC
  Administered 2014-09-10: 1000 mL via INTRAVENOUS

## 2014-09-10 MED ORDER — FLUCONAZOLE 40 MG/ML PO SUSR: 100.0000 mg | Freq: Every day | ORAL | Status: DC

## 2014-09-10 MED ORDER — IOHEXOL 300 MG/ML  SOLN
50.0000 mL | Freq: Once | INTRAMUSCULAR | Status: AC | PRN
  Administered 2014-09-10: 50 mL via ORAL

## 2014-09-10 NOTE — Progress Notes (Signed)
[name] with Dr. Gearldine Shown office called and said [name] needs to be NPO until his esophageal scan at 2:00.  [name] is receiving treatment so notified his wife, [name], that he needs to not eat or drink anything until after his scan at 2 pm.  [name] verbalized understanding.

## 2014-09-10 NOTE — Patient Instructions (Signed)

## 2014-09-11 ENCOUNTER — Ambulatory Visit (HOSPITAL_BASED_OUTPATIENT_CLINIC_OR_DEPARTMENT_OTHER): Payer: BLUE CROSS/BLUE SHIELD

## 2014-09-11 VITALS — BP 139/65 | HR 86 | Temp 98.6°F | Resp 18

## 2014-09-11 DIAGNOSIS — E860 Dehydration: Secondary | ICD-10-CM

## 2014-09-11 DIAGNOSIS — C155 Malignant neoplasm of lower third of esophagus: Secondary | ICD-10-CM

## 2014-09-11 DIAGNOSIS — C159 Malignant neoplasm of esophagus, unspecified: Secondary | ICD-10-CM

## 2014-09-11 MED ORDER — SODIUM CHLORIDE 0.9 % IV SOLN
INTRAVENOUS | Status: DC
Start: 2014-09-11 — End: 2014-09-11
  Administered 2014-09-11: 09:00:00 via INTRAVENOUS

## 2014-09-11 NOTE — Patient Instructions (Signed)

## 2014-09-13 ENCOUNTER — Ambulatory Visit (HOSPITAL_BASED_OUTPATIENT_CLINIC_OR_DEPARTMENT_OTHER): Payer: BLUE CROSS/BLUE SHIELD

## 2014-09-13 ENCOUNTER — Ambulatory Visit: Payer: BLUE CROSS/BLUE SHIELD

## 2014-09-13 ENCOUNTER — Ambulatory Visit: Admission: RE | Admit: 2014-09-13 | Payer: BLUE CROSS/BLUE SHIELD | Source: Ambulatory Visit

## 2014-09-13 ENCOUNTER — Ambulatory Visit
Admission: RE | Admit: 2014-09-13 | Discharge: 2014-09-13 | Disposition: A | Discharge status: Home / Self Care | Payer: BLUE CROSS/BLUE SHIELD | Source: Ambulatory Visit | Attending: Radiation Oncology | Admitting: Radiation Oncology

## 2014-09-13 VITALS — BP 152/85 | HR 90 | Temp 98.5°F | Resp 16

## 2014-09-13 DIAGNOSIS — E860 Dehydration: Secondary | ICD-10-CM

## 2014-09-13 DIAGNOSIS — C155 Malignant neoplasm of lower third of esophagus: Secondary | ICD-10-CM

## 2014-09-13 DIAGNOSIS — C159 Malignant neoplasm of esophagus, unspecified: Secondary | ICD-10-CM

## 2014-09-13 MED ORDER — SODIUM CHLORIDE 0.9 % IV SOLN
1000.0000 mL | Freq: Once | INTRAVENOUS | Status: AC
  Administered 2014-09-13: 1000 mL via INTRAVENOUS

## 2014-09-13 NOTE — Patient Instructions (Signed)
Dehydration, Adult Dehydration is when you lose more fluids from the body than you take in. Vital organs like the kidneys, brain, and heart cannot function without a proper amount of fluids and salt. Any loss of fluids from the body can cause dehydration.  CAUSES   Vomiting.  Diarrhea.  Excessive sweating.  Excessive urine output.  Fever. SYMPTOMS  Mild dehydration  Thirst.  Dry lips.  Slightly dry mouth. Moderate dehydration  Very dry mouth.  Sunken eyes.  Skin does not bounce back quickly when lightly pinched and released.  Dark urine and decreased urine production.  Decreased tear production.  Headache. Severe dehydration  Very dry mouth.  Extreme thirst.  Rapid, weak pulse (more than 100 beats per minute at rest).  Cold hands and feet.  Not able to sweat in spite of heat and temperature.  Rapid breathing.  Blue lips.  Confusion and lethargy.  Difficulty being awakened.  Minimal urine production.  No tears. DIAGNOSIS  Your caregiver will diagnose dehydration based on your symptoms and your exam. Blood and urine tests will help confirm the diagnosis. The diagnostic evaluation should also identify the cause of dehydration. TREATMENT  Treatment of mild or moderate dehydration can often be done at home by increasing the amount of fluids that you drink. It is best to drink small amounts of fluid more often. Drinking too much at one time can make vomiting worse. Refer to the home care instructions below. Severe dehydration needs to be treated at the hospital where you will probably be given intravenous (IV) fluids that contain water and electrolytes. HOME CARE INSTRUCTIONS   Ask your caregiver about specific rehydration instructions.  Drink enough fluids to keep your urine clear or pale yellow.  Drink small amounts frequently if you have nausea and vomiting.  Eat as you normally do.  Avoid:  Foods or drinks high in sugar.  Carbonated  drinks.  Juice.  Extremely hot or cold fluids.  Drinks with caffeine.  Fatty, greasy foods.  Alcohol.  Tobacco.  Overeating.  Gelatin desserts.  Wash your hands well to avoid spreading bacteria and viruses.  Only take over-the-counter or prescription medicines for pain, discomfort, or fever as directed by your caregiver.  Ask your caregiver if you should continue all prescribed and over-the-counter medicines.  Keep all follow-up appointments with your caregiver. SEEK MEDICAL CARE IF:  You have abdominal pain and it increases or stays in one area (localizes).  You have a rash, stiff neck, or severe headache.  You are irritable, sleepy, or difficult to awaken.  You are weak, dizzy, or extremely thirsty. SEEK IMMEDIATE MEDICAL CARE IF:   You are unable to keep fluids down or you get worse despite treatment.  You have frequent episodes of vomiting or diarrhea.  You have blood or green matter (bile) in your vomit.  You have blood in your stool or your stool looks black and tarry.  You have not urinated in 6 to 8 hours, or you have only urinated a small amount of very dark urine.  You have a fever.  You faint. MAKE SURE YOU:   Understand these instructions.  Will watch your condition.  Will get help right away if you are not doing well or get worse. Document Released: 06/25/2005 Document Revised: 09/17/2011 Document Reviewed: 02/12/2011 Exit[employer] Patient Information 2015 [employer]. This information is not intended to replace advice given to you by your health care provider. Make sure you discuss any questions you have with your health care   provider.  

## 2014-09-13 NOTE — Progress Notes (Signed)
Reported to Dr. Benay Spice that pt denies nausea but feels pressure in his chest "like a burp but instead of gas it is what I drank". Pt states he is unable to keep even water down. New orders received for IV fluids.

## 2014-09-14 ENCOUNTER — Encounter: Payer: Self-pay | Admitting: Radiation Oncology

## 2014-09-14 ENCOUNTER — Ambulatory Visit
Admission: RE | Admit: 2014-09-14 | Discharge: 2014-09-14 | Disposition: A | Discharge status: Home / Self Care | Payer: BLUE CROSS/BLUE SHIELD | Source: Ambulatory Visit | Attending: Radiation Oncology | Admitting: Radiation Oncology

## 2014-09-14 ENCOUNTER — Ambulatory Visit (HOSPITAL_COMMUNITY): Payer: BLUE CROSS/BLUE SHIELD

## 2014-09-14 ENCOUNTER — Other Ambulatory Visit: Payer: Self-pay | Admitting: *Deleted

## 2014-09-14 ENCOUNTER — Ambulatory Visit (HOSPITAL_BASED_OUTPATIENT_CLINIC_OR_DEPARTMENT_OTHER): Payer: BLUE CROSS/BLUE SHIELD

## 2014-09-14 ENCOUNTER — Ambulatory Visit: Payer: BLUE CROSS/BLUE SHIELD

## 2014-09-14 VITALS — BP 142/83 | HR 107 | Temp 99.0°F | Resp 16 | Ht 71.0 in | Wt 241.3 lb

## 2014-09-14 VITALS — BP 138/60 | HR 76 | Temp 99.6°F | Resp 18

## 2014-09-14 DIAGNOSIS — C159 Malignant neoplasm of esophagus, unspecified: Secondary | ICD-10-CM

## 2014-09-14 DIAGNOSIS — Z510 Encounter for antineoplastic radiation therapy: Secondary | ICD-10-CM | POA: Diagnosis not present

## 2014-09-14 DIAGNOSIS — C155 Malignant neoplasm of lower third of esophagus: Secondary | ICD-10-CM

## 2014-09-14 DIAGNOSIS — E860 Dehydration: Secondary | ICD-10-CM

## 2014-09-14 MED ORDER — SODIUM CHLORIDE 0.9 % IV SOLN
INTRAVENOUS | Status: DC
  Administered 2014-09-14: 08:00:00 via INTRAVENOUS

## 2014-09-14 MED ORDER — SODIUM CHLORIDE 0.9 % IV SOLN: INTRAVENOUS | Status: DC

## 2014-09-14 NOTE — Progress Notes (Signed)
Temp slightly elevated.  Will notify providers.  At this time reports small amount blood in urine at home this morning.  Spouse concerned of possible UTI.  Patient denies pain, burning, frequency or straining with urination.  Verbal order received and read back for UA/CS and BMET for tomorrow.  Continue IVF daily this week.  Asked patient to give U/A at discharge.  "I'll try."  Would like to go to radiation.  Says he'll try to give UA tomorrow.  P.O.F. generated

## 2014-09-14 NOTE — Progress Notes (Signed)
Vitals post IVF Stable.  Will notify provider of temp.

## 2014-09-14 NOTE — Patient Instructions (Signed)
Dehydration, Adult Dehydration is when you lose more fluids from the body than you take in. Vital organs like the kidneys, brain, and heart cannot function without a proper amount of fluids and salt. Any loss of fluids from the body can cause dehydration.  CAUSES   Vomiting.  Diarrhea.  Excessive sweating.  Excessive urine output.  Fever. SYMPTOMS  Mild dehydration  Thirst.  Dry lips.  Slightly dry mouth. Moderate dehydration  Very dry mouth.  Sunken eyes.  Skin does not bounce back quickly when lightly pinched and released.  Dark urine and decreased urine production.  Decreased tear production.  Headache. Severe dehydration  Very dry mouth.  Extreme thirst.  Rapid, weak pulse (more than 100 beats per minute at rest).  Cold hands and feet.  Not able to sweat in spite of heat and temperature.  Rapid breathing.  Blue lips.  Confusion and lethargy.  Difficulty being awakened.  Minimal urine production.  No tears. DIAGNOSIS  Your caregiver will diagnose dehydration based on your symptoms and your exam. Blood and urine tests will help confirm the diagnosis. The diagnostic evaluation should also identify the cause of dehydration. TREATMENT  Treatment of mild or moderate dehydration can often be done at home by increasing the amount of fluids that you drink. It is best to drink small amounts of fluid more often. Drinking too much at one time can make vomiting worse. Refer to the home care instructions below. Severe dehydration needs to be treated at the hospital where you will probably be given intravenous (IV) fluids that contain water and electrolytes. HOME CARE INSTRUCTIONS   Ask your caregiver about specific rehydration instructions.  Drink enough fluids to keep your urine clear or pale yellow.  Drink small amounts frequently if you have nausea and vomiting.  Eat as you normally do.  Avoid:  Foods or drinks high in sugar.  Carbonated  drinks.  Juice.  Extremely hot or cold fluids.  Drinks with caffeine.  Fatty, greasy foods.  Alcohol.  Tobacco.  Overeating.  Gelatin desserts.  Wash your hands well to avoid spreading bacteria and viruses.  Only take over-the-counter or prescription medicines for pain, discomfort, or fever as directed by your caregiver.  Ask your caregiver if you should continue all prescribed and over-the-counter medicines.  Keep all follow-up appointments with your caregiver. SEEK MEDICAL CARE IF:  You have abdominal pain and it increases or stays in one area (localizes).  You have a rash, stiff neck, or severe headache.  You are irritable, sleepy, or difficult to awaken.  You are weak, dizzy, or extremely thirsty. SEEK IMMEDIATE MEDICAL CARE IF:   You are unable to keep fluids down or you get worse despite treatment.  You have frequent episodes of vomiting or diarrhea.  You have blood or green matter (bile) in your vomit.  You have blood in your stool or your stool looks black and tarry.  You have not urinated in 6 to 8 hours, or you have only urinated a small amount of very dark urine.  You have a fever.  You faint. MAKE SURE YOU:   Understand these instructions.  Will watch your condition.  Will get help right away if you are not doing well or get worse. Document Released: 06/25/2005 Document Revised: 09/17/2011 Document Reviewed: 02/12/2011 Exit[employer] Patient Information 2015 [employer]. This information is not intended to replace advice given to you by your health care provider. Make sure you discuss any questions you have with your health care   provider.  

## 2014-09-14 NOTE — Progress Notes (Signed)
Discharged at 1040 with spouse to radiation appointment.

## 2014-09-14 NOTE — Progress Notes (Signed)
[name] has completed 24 fractions to his esophagus.  He denies pain but is still feeling pressure in his chest.  He reports he burps and brings up clear fluid.  He did have hydration today.  He is out of fentanyl patches.  He is using hycet occasionally.  He is taking Carafate, Protonix and started on diflucan. He has lost 5 lbs since 09/09/14.  He reports drinking 1 carnation instant breakfast, 2 ice pops, Gatorade and 8 oz of water yesterday.  He reports he is done with chemotherapy.  He reports seeing a small amount of blood in his urine tomorrow and is going to have a urinalysis tomorrow.  He reports his energy level is OK.  His skin on his chest is slightly pink.  BP 142/83 mmHg  Pulse 107  Temp(Src) 99 F (37.2 C) (Oral)  Resp 16  Ht 5\' 11"  (1.803 m)  Wt 241 lb 4.8 oz (109.453 kg)  BMI 33.67 kg/m2  SpO2 100%

## 2014-09-14 NOTE — Progress Notes (Signed)
  Radiation Oncology         [phone] ________________________________  Name: [name] MRN: [id number]  Date: 09/14/2014  DOB: 1940-09-22  Weekly Radiation Therapy Management  DIAGNOSIS: uT3, N1, Mx adenocarcinoma of the distal esophagus/GE junction  Current Dose: 43.2 Gy     Planned Dose:  50.4 Gy  Narrative . . . . . . . . The patient presents for routine under treatment assessment.                                   The patient continues to have problems with burping and chest pressure.  He did undergo a barium swallow which showed a stricture in the distal esophagus at the site of his tumor. This was felt to be secondary to residual tumor or edema from radiation therapy. There was no obstruction to passage of thin barium or any fistulas.                                 Set-up films were reviewed.                                 The chart was checked. Physical Findings. . .  height is 5\' 11"  (1.803 m) and weight is 241 lb 4.8 oz (109.453 kg). His oral temperature is 99 F (37.2 C). His blood pressure is 142/83 and his pulse is 107. His respiration is 16 and oxygen saturation is 100%. . No secondary infection noted in the oral cavity. Lungs are clear. The heart has a regular rhythm with a slightly increased rate. The abdomen is soft and nontender with normal bowel sounds. Impression . . . . . . . The patient is tolerating radiation. Plan . . . . . . . . . . . . Continue treatment as planned.  ________________________________   Blair Promise, PhD, MD

## 2014-09-15 ENCOUNTER — Ambulatory Visit
Admission: RE | Admit: 2014-09-15 | Discharge: 2014-09-15 | Disposition: A | Discharge status: Home / Self Care | Payer: BLUE CROSS/BLUE SHIELD | Source: Ambulatory Visit | Attending: Radiation Oncology | Admitting: Radiation Oncology

## 2014-09-15 ENCOUNTER — Other Ambulatory Visit (HOSPITAL_BASED_OUTPATIENT_CLINIC_OR_DEPARTMENT_OTHER): Payer: BLUE CROSS/BLUE SHIELD

## 2014-09-15 ENCOUNTER — Ambulatory Visit (HOSPITAL_BASED_OUTPATIENT_CLINIC_OR_DEPARTMENT_OTHER): Payer: BLUE CROSS/BLUE SHIELD

## 2014-09-15 ENCOUNTER — Ambulatory Visit: Payer: BLUE CROSS/BLUE SHIELD

## 2014-09-15 VITALS — BP 139/72 | HR 93 | Temp 98.5°F | Resp 18

## 2014-09-15 DIAGNOSIS — C155 Malignant neoplasm of lower third of esophagus: Secondary | ICD-10-CM

## 2014-09-15 DIAGNOSIS — Z510 Encounter for antineoplastic radiation therapy: Secondary | ICD-10-CM | POA: Diagnosis not present

## 2014-09-15 DIAGNOSIS — C159 Malignant neoplasm of esophagus, unspecified: Secondary | ICD-10-CM

## 2014-09-15 DIAGNOSIS — E860 Dehydration: Secondary | ICD-10-CM

## 2014-09-15 LAB — URINALYSIS, MICROSCOPIC - CHCC
BLOOD: NEGATIVE
Bilirubin (Urine): NEGATIVE
Glucose: NEGATIVE mg/dL
KETONES: NEGATIVE mg/dL
Leukocyte Esterase: NEGATIVE
Nitrite: NEGATIVE
PH: 6 (ref 4.6–8.0)
Protein: 100 mg/dL
SPECIFIC GRAVITY, URINE: 1.02 (ref 1.003–1.035)
UROBILINOGEN UR: 0.2 mg/dL (ref 0.2–1)

## 2014-09-15 LAB — CBC WITH DIFFERENTIAL/PLATELET
BASO%: 0.6 % (ref 0.0–2.0)
BASOS ABS: 0 10*3/uL (ref 0.0–0.1)
EOS%: 1 % (ref 0.0–7.0)
Eosinophils Absolute: 0 10*3/uL (ref 0.0–0.5)
HEMATOCRIT: 36.7 % — AB (ref 38.4–49.9)
HGB: 12.1 g/dL — ABNORMAL LOW (ref 13.0–17.1)
LYMPH%: 6.6 % — ABNORMAL LOW (ref 14.0–49.0)
MCH: 28.7 pg (ref 27.2–33.4)
MCHC: 33 g/dL (ref 32.0–36.0)
MCV: 87 fL (ref 79.3–98.0)
MONO#: 0.4 10*3/uL (ref 0.1–0.9)
MONO%: 12.7 % (ref 0.0–14.0)
NEUT%: 79.1 % — ABNORMAL HIGH (ref 39.0–75.0)
NEUTROS ABS: 2.6 10*3/uL (ref 1.5–6.5)
PLATELETS: 96 10*3/uL — AB (ref 140–400)
RBC: 4.21 10*6/uL (ref 4.20–5.82)
RDW: 13.2 % (ref 11.0–14.6)
WBC: 3.3 10*3/uL — ABNORMAL LOW (ref 4.0–10.3)
lymph#: 0.2 10*3/uL — ABNORMAL LOW (ref 0.9–3.3)

## 2014-09-15 LAB — BASIC METABOLIC PANEL (CC13)
Anion Gap: 10 mEq/L (ref 3–11)
BUN: 16.4 mg/dL (ref 7.0–26.0)
CALCIUM: 9.3 mg/dL (ref 8.4–10.4)
CHLORIDE: 110 meq/L — AB (ref 98–109)
CO2: 22 mEq/L (ref 22–29)
CREATININE: 1.1 mg/dL (ref 0.7–1.3)
EGFR: 69 mL/min/{1.73_m2} — AB (ref >90)
Glucose: 105 mg/dl (ref 70–140)
Potassium: 3.8 mEq/L (ref 3.5–5.1)
SODIUM: 142 meq/L (ref 136–145)

## 2014-09-15 MED ORDER — SODIUM CHLORIDE 0.9 % IV SOLN
INTRAVENOUS | Status: AC
  Administered 2014-09-15: 09:00:00 via INTRAVENOUS

## 2014-09-15 NOTE — Progress Notes (Signed)
1100 discharged ambulatory in no distress with spouse.

## 2014-09-15 NOTE — Patient Instructions (Signed)
Dehydration, Adult Dehydration is when you lose more fluids from the body than you take in. Vital organs like the kidneys, brain, and heart cannot function without a proper amount of fluids and salt. Any loss of fluids from the body can cause dehydration.  CAUSES   Vomiting.  Diarrhea.  Excessive sweating.  Excessive urine output.  Fever. SYMPTOMS  Mild dehydration  Thirst.  Dry lips.  Slightly dry mouth. Moderate dehydration  Very dry mouth.  Sunken eyes.  Skin does not bounce back quickly when lightly pinched and released.  Dark urine and decreased urine production.  Decreased tear production.  Headache. Severe dehydration  Very dry mouth.  Extreme thirst.  Rapid, weak pulse (more than 100 beats per minute at rest).  Cold hands and feet.  Not able to sweat in spite of heat and temperature.  Rapid breathing.  Blue lips.  Confusion and lethargy.  Difficulty being awakened.  Minimal urine production.  No tears. DIAGNOSIS  Your caregiver will diagnose dehydration based on your symptoms and your exam. Blood and urine tests will help confirm the diagnosis. The diagnostic evaluation should also identify the cause of dehydration. TREATMENT  Treatment of mild or moderate dehydration can often be done at home by increasing the amount of fluids that you drink. It is best to drink small amounts of fluid more often. Drinking too much at one time can make vomiting worse. Refer to the home care instructions below. Severe dehydration needs to be treated at the hospital where you will probably be given intravenous (IV) fluids that contain water and electrolytes. HOME CARE INSTRUCTIONS   Ask your caregiver about specific rehydration instructions.  Drink enough fluids to keep your urine clear or pale yellow.  Drink small amounts frequently if you have nausea and vomiting.  Eat as you normally do.  Avoid:  Foods or drinks high in sugar.  Carbonated  drinks.  Juice.  Extremely hot or cold fluids.  Drinks with caffeine.  Fatty, greasy foods.  Alcohol.  Tobacco.  Overeating.  Gelatin desserts.  Wash your hands well to avoid spreading bacteria and viruses.  Only take over-the-counter or prescription medicines for pain, discomfort, or fever as directed by your caregiver.  Ask your caregiver if you should continue all prescribed and over-the-counter medicines.  Keep all follow-up appointments with your caregiver. SEEK MEDICAL CARE IF:  You have abdominal pain and it increases or stays in one area (localizes).  You have a rash, stiff neck, or severe headache.  You are irritable, sleepy, or difficult to awaken.  You are weak, dizzy, or extremely thirsty. SEEK IMMEDIATE MEDICAL CARE IF:   You are unable to keep fluids down or you get worse despite treatment.  You have frequent episodes of vomiting or diarrhea.  You have blood or green matter (bile) in your vomit.  You have blood in your stool or your stool looks black and tarry.  You have not urinated in 6 to 8 hours, or you have only urinated a small amount of very dark urine.  You have a fever.  You faint. MAKE SURE YOU:   Understand these instructions.  Will watch your condition.  Will get help right away if you are not doing well or get worse. Document Released: 06/25/2005 Document Revised: 09/17/2011 Document Reviewed: 02/12/2011 Exit[employer] Patient Information 2015 [employer]. This information is not intended to replace advice given to you by your health care provider. Make sure you discuss any questions you have with your health care   provider.  

## 2014-09-15 NOTE — Progress Notes (Signed)
1030:  Radiation staff has taken patient down for treatment.  IVF rate decreased so it does not alarm during RT.   1053 Returned from RT.  Bag empty alarming air.  IV d/c'd and ready for discharge.

## 2014-09-16 ENCOUNTER — Ambulatory Visit (HOSPITAL_BASED_OUTPATIENT_CLINIC_OR_DEPARTMENT_OTHER): Payer: BLUE CROSS/BLUE SHIELD | Admitting: Oncology

## 2014-09-16 ENCOUNTER — Ambulatory Visit
Admission: RE | Admit: 2014-09-16 | Discharge: 2014-09-16 | Disposition: A | Discharge status: Home / Self Care | Payer: BLUE CROSS/BLUE SHIELD | Source: Ambulatory Visit | Attending: Radiation Oncology | Admitting: Radiation Oncology

## 2014-09-16 ENCOUNTER — Ambulatory Visit: Payer: BLUE CROSS/BLUE SHIELD | Admitting: Cardiothoracic Surgery

## 2014-09-16 ENCOUNTER — Telehealth: Payer: Self-pay | Admitting: Oncology

## 2014-09-16 ENCOUNTER — Ambulatory Visit: Payer: BLUE CROSS/BLUE SHIELD

## 2014-09-16 ENCOUNTER — Ambulatory Visit (HOSPITAL_BASED_OUTPATIENT_CLINIC_OR_DEPARTMENT_OTHER): Payer: BLUE CROSS/BLUE SHIELD

## 2014-09-16 ENCOUNTER — Other Ambulatory Visit: Payer: Self-pay | Admitting: Medical Oncology

## 2014-09-16 ENCOUNTER — Other Ambulatory Visit: Payer: BLUE CROSS/BLUE SHIELD

## 2014-09-16 VITALS — BP 140/64 | HR 87 | Temp 97.6°F | Resp 19 | Ht 71.0 in | Wt 240.9 lb

## 2014-09-16 DIAGNOSIS — E119 Type 2 diabetes mellitus without complications: Secondary | ICD-10-CM

## 2014-09-16 DIAGNOSIS — R1310 Dysphagia, unspecified: Secondary | ICD-10-CM

## 2014-09-16 DIAGNOSIS — C159 Malignant neoplasm of esophagus, unspecified: Secondary | ICD-10-CM

## 2014-09-16 DIAGNOSIS — I10 Essential (primary) hypertension: Secondary | ICD-10-CM

## 2014-09-16 DIAGNOSIS — E785 Hyperlipidemia, unspecified: Secondary | ICD-10-CM

## 2014-09-16 DIAGNOSIS — Z510 Encounter for antineoplastic radiation therapy: Secondary | ICD-10-CM | POA: Diagnosis not present

## 2014-09-16 DIAGNOSIS — C155 Malignant neoplasm of lower third of esophagus: Secondary | ICD-10-CM

## 2014-09-16 LAB — URINE CULTURE

## 2014-09-16 MED ORDER — SODIUM CHLORIDE 0.9 % IV SOLN
INTRAVENOUS | Status: DC
  Administered 2014-09-16: 09:00:00 via INTRAVENOUS

## 2014-09-16 NOTE — Progress Notes (Signed)
  [name] OFFICE PROGRESS NOTE   Diagnosis: Esophagus cancer  INTERVAL HISTORY:   He returns as scheduled. He completed a final cycle of chemotherapy 09/09/2014. No nausea or neuropathy symptoms. He continues to have severe dysphagia. He has been able to tolerate fluids yesterday and today. Minimal pain. His calorie intake has been poor. He continues daily radiation.  An esophagogram on 09/10/2014 revealed an irregular distal esophageal stricture. Esophagus motility appeared normal. Barium passed into the stomach. A 13 mm tablet did not pass into the stomach during greater than 3 minutes of fluoroscopy.  He has been receiving daily IV fluids.  Objective:  Vital signs in last 24 hours:  Blood pressure 140/64, pulse 87, temperature 97.6 F (36.4 C), temperature source Oral, resp. rate 19, height 5\' 11"  (1.803 m), weight 240 lb 14.4 oz (109.272 kg), SpO2 100 %.    HEENT: No thrush or ulcers, the mucous members are moist Resp: Lungs clear bilaterally Cardio: Regular rate and rhythm GI: No hepatosplenomegaly, nontender Vascular: No leg edema, normal skin turgor     Lab Results:  Lab Results  Component Value Date   WBC 3.3* 09/15/2014   HGB 12.1* 09/15/2014   HCT 36.7* 09/15/2014   MCV 87.0 09/15/2014   PLT 96* 09/15/2014   NEUTROABS 2.6 09/15/2014   potassium 3.8, creatinine 1.1  Medications: I have reviewed the patient's current medications.  Assessment/Plan: 1. 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 No extension beyond the gastroesophageal junction into the  proximal stomach. Previously noted borderline enlarged paraesophageal lymph nodes showed low level metabolic activity. Multiple areas of presumably physiologic activity throughout the small bowel and colon.  Initiation of radiation and weekly Taxol/carboplatin 08/10/2014  2. Solid dysphagia secondary to #1   3. History of coronary artery disease, status post coronary artery bypass surgery in 2002   4. Diabetes   5. Hypertension   6. Hyperlipidemia   7. Aortic stenosis   8. Progressive dysphagia during the course of chemotherapy/radiation-barium esophagram 09/10/2014 confirmed a distal esophageal stricture, barium passed into the stomach   Disposition:  [name] continues to have dysphagia, but he has been able to tolerate liquids over the past few days. He will continue the planned course of radiation and daily IV fluids. I encouraged him to increase his calorie intake as tolerated. He is scheduled to meet with the Cancer center nutritionist tomorrow. He will return for an office visit in one week. We will consider referring him for placement of a feeding tube if his calorie intake does not improve over the next week.  Betsy Coder, MD  09/16/2014  1:22 PM

## 2014-09-16 NOTE — Telephone Encounter (Signed)
gv and printed appt sched and avs for pt for March...sed added tx  °

## 2014-09-16 NOTE — Patient Instructions (Signed)

## 2014-09-17 ENCOUNTER — Ambulatory Visit
Admission: RE | Admit: 2014-09-17 | Discharge: 2014-09-17 | Disposition: A | Discharge status: Home / Self Care | Payer: BLUE CROSS/BLUE SHIELD | Source: Ambulatory Visit | Attending: Radiation Oncology | Admitting: Radiation Oncology

## 2014-09-17 ENCOUNTER — Other Ambulatory Visit: Payer: Self-pay | Admitting: *Deleted

## 2014-09-17 ENCOUNTER — Ambulatory Visit: Payer: BLUE CROSS/BLUE SHIELD

## 2014-09-17 ENCOUNTER — Ambulatory Visit (HOSPITAL_BASED_OUTPATIENT_CLINIC_OR_DEPARTMENT_OTHER): Payer: BLUE CROSS/BLUE SHIELD

## 2014-09-17 ENCOUNTER — Ambulatory Visit (HOSPITAL_COMMUNITY): Payer: BLUE CROSS/BLUE SHIELD

## 2014-09-17 ENCOUNTER — Ambulatory Visit: Payer: BLUE CROSS/BLUE SHIELD | Admitting: Nutrition

## 2014-09-17 VITALS — BP 151/66 | HR 88 | Temp 98.0°F

## 2014-09-17 DIAGNOSIS — C159 Malignant neoplasm of esophagus, unspecified: Secondary | ICD-10-CM

## 2014-09-17 DIAGNOSIS — Z510 Encounter for antineoplastic radiation therapy: Secondary | ICD-10-CM | POA: Diagnosis not present

## 2014-09-17 DIAGNOSIS — C155 Malignant neoplasm of lower third of esophagus: Secondary | ICD-10-CM

## 2014-09-17 DIAGNOSIS — R1310 Dysphagia, unspecified: Secondary | ICD-10-CM

## 2014-09-17 MED ORDER — SODIUM CHLORIDE 0.9 % IV SOLN
INTRAVENOUS | Status: DC
  Administered 2014-09-17: 08:00:00 via INTRAVENOUS

## 2014-09-17 NOTE — Patient Instructions (Signed)
Dehydration, Adult Dehydration is when you lose more fluids from the body than you take in. Vital organs like the kidneys, brain, and heart cannot function without a proper amount of fluids and salt. Any loss of fluids from the body can cause dehydration.  CAUSES   Vomiting.  Diarrhea.  Excessive sweating.  Excessive urine output.  Fever. SYMPTOMS  Mild dehydration  Thirst.  Dry lips.  Slightly dry mouth. Moderate dehydration  Very dry mouth.  Sunken eyes.  Skin does not bounce back quickly when lightly pinched and released.  Dark urine and decreased urine production.  Decreased tear production.  Headache. Severe dehydration  Very dry mouth.  Extreme thirst.  Rapid, weak pulse (more than 100 beats per minute at rest).  Cold hands and feet.  Not able to sweat in spite of heat and temperature.  Rapid breathing.  Blue lips.  Confusion and lethargy.  Difficulty being awakened.  Minimal urine production.  No tears. DIAGNOSIS  Your caregiver will diagnose dehydration based on your symptoms and your exam. Blood and urine tests will help confirm the diagnosis. The diagnostic evaluation should also identify the cause of dehydration. TREATMENT  Treatment of mild or moderate dehydration can often be done at home by increasing the amount of fluids that you drink. It is best to drink small amounts of fluid more often. Drinking too much at one time can make vomiting worse. Refer to the home care instructions below. Severe dehydration needs to be treated at the hospital where you will probably be given intravenous (IV) fluids that contain water and electrolytes. HOME CARE INSTRUCTIONS   Ask your caregiver about specific rehydration instructions.  Drink enough fluids to keep your urine clear or pale yellow.  Drink small amounts frequently if you have nausea and vomiting.  Eat as you normally do.  Avoid:  Foods or drinks high in sugar.  Carbonated  drinks.  Juice.  Extremely hot or cold fluids.  Drinks with caffeine.  Fatty, greasy foods.  Alcohol.  Tobacco.  Overeating.  Gelatin desserts.  Wash your hands well to avoid spreading bacteria and viruses.  Only take over-the-counter or prescription medicines for pain, discomfort, or fever as directed by your caregiver.  Ask your caregiver if you should continue all prescribed and over-the-counter medicines.  Keep all follow-up appointments with your caregiver. SEEK MEDICAL CARE IF:  You have abdominal pain and it increases or stays in one area (localizes).  You have a rash, stiff neck, or severe headache.  You are irritable, sleepy, or difficult to awaken.  You are weak, dizzy, or extremely thirsty. SEEK IMMEDIATE MEDICAL CARE IF:   You are unable to keep fluids down or you get worse despite treatment.  You have frequent episodes of vomiting or diarrhea.  You have blood or green matter (bile) in your vomit.  You have blood in your stool or your stool looks black and tarry.  You have not urinated in 6 to 8 hours, or you have only urinated a small amount of very dark urine.  You have a fever.  You faint. MAKE SURE YOU:   Understand these instructions.  Will watch your condition.  Will get help right away if you are not doing well or get worse. Document Released: 06/25/2005 Document Revised: 09/17/2011 Document Reviewed: 02/12/2011 Exit[employer] Patient Information 2015 [employer]. This information is not intended to replace advice given to you by your health care provider. Make sure you discuss any questions you have with your health care   provider.  

## 2014-09-17 NOTE — Progress Notes (Signed)
Nutrition follow-up completed with patient and wife during IV fluids. Patient reports he is unable to swallow.  He cannot tolerate liquids or solids most days.  He continues to spit up.  Yesterday was a "good" day.  Patient was able to consume one cup of soup, one cup of Gatorade and a small frosty for approximately 500 calories and 10 grams protein. Other days this week, patient has been unable to swallow water or other liquids. Weight decreased and documented as 240.9 pounds March 10 from 256.8 pounds February 9. This is a 17 pound weight loss over 4 weeks. Final radiation treatment on March 15. Per patient, feeding tube is not an option right now.  Patient meets criteria for severe malnutrition in the context of chronic illness secondary to 6% weight loss over one month and less than 75% energy intake for greater than one month.   Estimated nutrition needs: 2300-2500 cal, 130-140 grams protein, 2.5 L fluid.  Nutrition diagnosis: Unintended weight loss continues.  Intervention:  Provided support and encouragement to patient and wife. Provided patient with samples of high-calorie, high-protein nutrition supplements along with strategies to increase tolerance. Recommended patient receive jejunostomy feeding tube if he is unable to increase oral intake.  Monitoring, evaluation, goals: Patient will work to increase oral intake to minimize further depletion and weight loss.  Next visit: Tuesday, March 15.  **Disclaimer: This note was dictated with voice recognition software. Similar sounding words can inadvertently be transcribed and this note may contain transcription errors which may not have been corrected upon publication of note.**

## 2014-09-18 ENCOUNTER — Ambulatory Visit (HOSPITAL_BASED_OUTPATIENT_CLINIC_OR_DEPARTMENT_OTHER): Payer: BLUE CROSS/BLUE SHIELD

## 2014-09-18 VITALS — BP 127/71 | HR 75 | Temp 98.1°F | Resp 18

## 2014-09-18 DIAGNOSIS — C159 Malignant neoplasm of esophagus, unspecified: Secondary | ICD-10-CM

## 2014-09-18 DIAGNOSIS — R1310 Dysphagia, unspecified: Secondary | ICD-10-CM

## 2014-09-18 MED ORDER — SODIUM CHLORIDE 0.9 % IV SOLN
INTRAVENOUS | Status: DC
  Administered 2014-09-18: 09:00:00 via INTRAVENOUS

## 2014-09-20 ENCOUNTER — Ambulatory Visit (HOSPITAL_BASED_OUTPATIENT_CLINIC_OR_DEPARTMENT_OTHER): Payer: BLUE CROSS/BLUE SHIELD

## 2014-09-20 ENCOUNTER — Ambulatory Visit: Payer: BLUE CROSS/BLUE SHIELD

## 2014-09-20 ENCOUNTER — Ambulatory Visit
Admission: RE | Admit: 2014-09-20 | Discharge: 2014-09-20 | Disposition: A | Discharge status: Home / Self Care | Payer: BLUE CROSS/BLUE SHIELD | Source: Ambulatory Visit | Attending: Radiation Oncology | Admitting: Radiation Oncology

## 2014-09-20 VITALS — BP 146/67 | HR 80 | Temp 97.9°F | Resp 18

## 2014-09-20 DIAGNOSIS — C159 Malignant neoplasm of esophagus, unspecified: Secondary | ICD-10-CM

## 2014-09-20 DIAGNOSIS — Z510 Encounter for antineoplastic radiation therapy: Secondary | ICD-10-CM | POA: Diagnosis not present

## 2014-09-20 DIAGNOSIS — R1310 Dysphagia, unspecified: Secondary | ICD-10-CM

## 2014-09-20 DIAGNOSIS — C155 Malignant neoplasm of lower third of esophagus: Secondary | ICD-10-CM

## 2014-09-20 MED ORDER — SODIUM CHLORIDE 0.9 % IV SOLN
INTRAVENOUS | Status: DC
  Administered 2014-09-20: 09:00:00 via INTRAVENOUS

## 2014-09-20 NOTE — Patient Instructions (Signed)
Dehydration, Adult Dehydration is when you lose more fluids from the body than you take in. Vital organs like the kidneys, brain, and heart cannot function without a proper amount of fluids and salt. Any loss of fluids from the body can cause dehydration.  CAUSES   Vomiting.  Diarrhea.  Excessive sweating.  Excessive urine output.  Fever. SYMPTOMS  Mild dehydration  Thirst.  Dry lips.  Slightly dry mouth. Moderate dehydration  Very dry mouth.  Sunken eyes.  Skin does not bounce back quickly when lightly pinched and released.  Dark urine and decreased urine production.  Decreased tear production.  Headache. Severe dehydration  Very dry mouth.  Extreme thirst.  Rapid, weak pulse (more than 100 beats per minute at rest).  Cold hands and feet.  Not able to sweat in spite of heat and temperature.  Rapid breathing.  Blue lips.  Confusion and lethargy.  Difficulty being awakened.  Minimal urine production.  No tears. DIAGNOSIS  Your caregiver will diagnose dehydration based on your symptoms and your exam. Blood and urine tests will help confirm the diagnosis. The diagnostic evaluation should also identify the cause of dehydration. TREATMENT  Treatment of mild or moderate dehydration can often be done at home by increasing the amount of fluids that you drink. It is best to drink small amounts of fluid more often. Drinking too much at one time can make vomiting worse. Refer to the home care instructions below. Severe dehydration needs to be treated at the hospital where you will probably be given intravenous (IV) fluids that contain water and electrolytes. HOME CARE INSTRUCTIONS   Ask your caregiver about specific rehydration instructions.  Drink enough fluids to keep your urine clear or pale yellow.  Drink small amounts frequently if you have nausea and vomiting.  Eat as you normally do.  Avoid:  Foods or drinks high in sugar.  Carbonated  drinks.  Juice.  Extremely hot or cold fluids.  Drinks with caffeine.  Fatty, greasy foods.  Alcohol.  Tobacco.  Overeating.  Gelatin desserts.  Wash your hands well to avoid spreading bacteria and viruses.  Only take over-the-counter or prescription medicines for pain, discomfort, or fever as directed by your caregiver.  Ask your caregiver if you should continue all prescribed and over-the-counter medicines.  Keep all follow-up appointments with your caregiver. SEEK MEDICAL CARE IF:  You have abdominal pain and it increases or stays in one area (localizes).  You have a rash, stiff neck, or severe headache.  You are irritable, sleepy, or difficult to awaken.  You are weak, dizzy, or extremely thirsty. SEEK IMMEDIATE MEDICAL CARE IF:   You are unable to keep fluids down or you get worse despite treatment.  You have frequent episodes of vomiting or diarrhea.  You have blood or green matter (bile) in your vomit.  You have blood in your stool or your stool looks black and tarry.  You have not urinated in 6 to 8 hours, or you have only urinated a small amount of very dark urine.  You have a fever.  You faint. MAKE SURE YOU:   Understand these instructions.  Will watch your condition.  Will get help right away if you are not doing well or get worse. Document Released: 06/25/2005 Document Revised: 09/17/2011 Document Reviewed: 02/12/2011 Exit[employer] Patient Information 2015 [employer]. This information is not intended to replace advice given to you by your health care provider. Make sure you discuss any questions you have with your health care   provider.  

## 2014-09-21 ENCOUNTER — Ambulatory Visit (HOSPITAL_BASED_OUTPATIENT_CLINIC_OR_DEPARTMENT_OTHER): Payer: BLUE CROSS/BLUE SHIELD

## 2014-09-21 ENCOUNTER — Ambulatory Visit
Admission: RE | Admit: 2014-09-21 | Discharge: 2014-09-21 | Disposition: A | Discharge status: Home / Self Care | Payer: BLUE CROSS/BLUE SHIELD | Source: Ambulatory Visit | Attending: Radiation Oncology | Admitting: Radiation Oncology

## 2014-09-21 ENCOUNTER — Ambulatory Visit: Payer: BLUE CROSS/BLUE SHIELD

## 2014-09-21 ENCOUNTER — Ambulatory Visit: Payer: BLUE CROSS/BLUE SHIELD | Admitting: Nutrition

## 2014-09-21 ENCOUNTER — Other Ambulatory Visit: Payer: Self-pay | Admitting: Nurse Practitioner

## 2014-09-21 ENCOUNTER — Other Ambulatory Visit (HOSPITAL_BASED_OUTPATIENT_CLINIC_OR_DEPARTMENT_OTHER): Payer: BLUE CROSS/BLUE SHIELD

## 2014-09-21 VITALS — BP 166/76 | HR 101 | Temp 98.8°F | Resp 20 | Ht 71.0 in | Wt 243.1 lb

## 2014-09-21 VITALS — BP 151/81 | HR 97 | Temp 98.9°F | Resp 18

## 2014-09-21 DIAGNOSIS — C159 Malignant neoplasm of esophagus, unspecified: Secondary | ICD-10-CM

## 2014-09-21 DIAGNOSIS — C155 Malignant neoplasm of lower third of esophagus: Secondary | ICD-10-CM

## 2014-09-21 DIAGNOSIS — R1310 Dysphagia, unspecified: Secondary | ICD-10-CM

## 2014-09-21 DIAGNOSIS — Z510 Encounter for antineoplastic radiation therapy: Secondary | ICD-10-CM | POA: Diagnosis not present

## 2014-09-21 LAB — CBC WITH DIFFERENTIAL/PLATELET
BASO%: 0.7 % (ref 0.0–2.0)
Basophils Absolute: 0 10*3/uL (ref 0.0–0.1)
EOS ABS: 0 10*3/uL (ref 0.0–0.5)
EOS%: 0.7 % (ref 0.0–7.0)
HEMATOCRIT: 35.6 % — AB (ref 38.4–49.9)
HEMOGLOBIN: 12.3 g/dL — AB (ref 13.0–17.1)
LYMPH%: 5.4 % — ABNORMAL LOW (ref 14.0–49.0)
MCH: 30 pg (ref 27.2–33.4)
MCHC: 34.6 g/dL (ref 32.0–36.0)
MCV: 86.8 fL (ref 79.3–98.0)
MONO#: 0.6 10*3/uL (ref 0.1–0.9)
MONO%: 20 % — ABNORMAL HIGH (ref 0.0–14.0)
NEUT#: 2.2 10*3/uL (ref 1.5–6.5)
NEUT%: 73.2 % (ref 39.0–75.0)
Platelets: 130 10*3/uL — ABNORMAL LOW (ref 140–400)
RBC: 4.1 10*6/uL — ABNORMAL LOW (ref 4.20–5.82)
RDW: 14.4 % (ref 11.0–14.6)
WBC: 3 10*3/uL — AB (ref 4.0–10.3)
lymph#: 0.2 10*3/uL — ABNORMAL LOW (ref 0.9–3.3)

## 2014-09-21 LAB — COMPREHENSIVE METABOLIC PANEL (CC13)
ALBUMIN: 3.3 g/dL — AB (ref 3.5–5.0)
ALK PHOS: 56 U/L (ref 40–150)
ALT: 19 U/L (ref 0–55)
ANION GAP: 12 meq/L — AB (ref 3–11)
AST: 20 U/L (ref 5–34)
BILIRUBIN TOTAL: 0.89 mg/dL (ref 0.20–1.20)
BUN: 9.5 mg/dL (ref 7.0–26.0)
CO2: 26 meq/L (ref 22–29)
CREATININE: 1.1 mg/dL (ref 0.7–1.3)
Calcium: 9.3 mg/dL (ref 8.4–10.4)
Chloride: 107 mEq/L (ref 98–109)
EGFR: 71 mL/min/{1.73_m2} — ABNORMAL LOW (ref >90)
Glucose: 102 mg/dl (ref 70–140)
Potassium: 3.6 mEq/L (ref 3.5–5.1)
SODIUM: 145 meq/L (ref 136–145)
Total Protein: 6.6 g/dL (ref 6.4–8.3)

## 2014-09-21 LAB — MAGNESIUM (CC13): Magnesium: 1.7 mg/dl (ref 1.5–2.5)

## 2014-09-21 MED ORDER — HYDROCODONE-ACETAMINOPHEN 7.5-325 MG/15ML PO SOLN: 10.0000 mL | Freq: Four times a day (QID) | ORAL | Status: DC | PRN

## 2014-09-21 MED ORDER — SODIUM CHLORIDE 0.9 % IV SOLN
INTRAVENOUS | Status: DC
  Administered 2014-09-21: 13:00:00 via INTRAVENOUS

## 2014-09-21 NOTE — Progress Notes (Signed)
Weekly Management Note Current Dose:50.4 Gy  Projected Dose:50.4 Gy   Narrative:  The patient presents for routine under treatment assessment.  CBCT/MVCT images/Port film x-rays were reviewed.  The chart was checked. Doing well. Receiving IVF this week. Skin intact. Taking hydrocodone bid but could take more to eat.   Physical Findings:  Unchanged. Alert and oriented.   Vitals:  Filed Vitals:   09/21/14 1112  BP: 166/76  Pulse: 101  Temp: 98.8 F (37.1 C)  Resp: 20   Weight:  Wt Readings from Last 3 Encounters:  09/16/14 240 lb 14.4 oz (109.272 kg)  09/14/14 241 lb 4.8 oz (109.453 kg)  09/09/14 246 lb 4.8 oz (111.721 kg)   Lab Results  Component Value Date   WBC 3.3* 09/15/2014   HGB 12.1* 09/15/2014   HCT 36.7* 09/15/2014   MCV 87.0 09/15/2014   PLT 96* 09/15/2014   Lab Results  Component Value Date   CREATININE 1.1 09/15/2014   BUN 16.4 09/15/2014   NA 142 09/15/2014   K 3.8 09/15/2014   CL 104 07/12/2014   CO2 22 09/15/2014     Impression:  The patient is tolerating radiation.  Plan:  Continue treatment as planned. Increase hycet if needed to increase po intake. Follow up in 1 weeks.

## 2014-09-21 NOTE — Patient Instructions (Signed)
Dehydration, Adult Dehydration is when you lose more fluids from the body than you take in. Vital organs like the kidneys, brain, and heart cannot function without a proper amount of fluids and salt. Any loss of fluids from the body can cause dehydration.  CAUSES   Vomiting.  Diarrhea.  Excessive sweating.  Excessive urine output.  Fever. SYMPTOMS  Mild dehydration  Thirst.  Dry lips.  Slightly dry mouth. Moderate dehydration  Very dry mouth.  Sunken eyes.  Skin does not bounce back quickly when lightly pinched and released.  Dark urine and decreased urine production.  Decreased tear production.  Headache. Severe dehydration  Very dry mouth.  Extreme thirst.  Rapid, weak pulse (more than 100 beats per minute at rest).  Cold hands and feet.  Not able to sweat in spite of heat and temperature.  Rapid breathing.  Blue lips.  Confusion and lethargy.  Difficulty being awakened.  Minimal urine production.  No tears. DIAGNOSIS  Your caregiver will diagnose dehydration based on your symptoms and your exam. Blood and urine tests will help confirm the diagnosis. The diagnostic evaluation should also identify the cause of dehydration. TREATMENT  Treatment of mild or moderate dehydration can often be done at home by increasing the amount of fluids that you drink. It is best to drink small amounts of fluid more often. Drinking too much at one time can make vomiting worse. Refer to the home care instructions below. Severe dehydration needs to be treated at the hospital where you will probably be given intravenous (IV) fluids that contain water and electrolytes. HOME CARE INSTRUCTIONS   Ask your caregiver about specific rehydration instructions.  Drink enough fluids to keep your urine clear or pale yellow.  Drink small amounts frequently if you have nausea and vomiting.  Eat as you normally do.  Avoid:  Foods or drinks high in sugar.  Carbonated  drinks.  Juice.  Extremely hot or cold fluids.  Drinks with caffeine.  Fatty, greasy foods.  Alcohol.  Tobacco.  Overeating.  Gelatin desserts.  Wash your hands well to avoid spreading bacteria and viruses.  Only take over-the-counter or prescription medicines for pain, discomfort, or fever as directed by your caregiver.  Ask your caregiver if you should continue all prescribed and over-the-counter medicines.  Keep all follow-up appointments with your caregiver. SEEK MEDICAL CARE IF:  You have abdominal pain and it increases or stays in one area (localizes).  You have a rash, stiff neck, or severe headache.  You are irritable, sleepy, or difficult to awaken.  You are weak, dizzy, or extremely thirsty. SEEK IMMEDIATE MEDICAL CARE IF:   You are unable to keep fluids down or you get worse despite treatment.  You have frequent episodes of vomiting or diarrhea.  You have blood or green matter (bile) in your vomit.  You have blood in your stool or your stool looks black and tarry.  You have not urinated in 6 to 8 hours, or you have only urinated a small amount of very dark urine.  You have a fever.  You faint. MAKE SURE YOU:   Understand these instructions.  Will watch your condition.  Will get help right away if you are not doing well or get worse. Document Released: 06/25/2005 Document Revised: 09/17/2011 Document Reviewed: 02/12/2011 Exit[employer] Patient Information 2015 [employer]. This information is not intended to replace advice given to you by your health care provider. Make sure you discuss any questions you have with your health care   provider.  

## 2014-09-21 NOTE — Progress Notes (Signed)
Nutrition follow-up completed with patient and wife, during infusion. Patient reports oral intake is sporadic but has improved considerably over the past few days.  Patient has decided not to receive the jejunostomy feeding tube. Patient completed radiation therapy today. Weight increased and was documented as 243 pounds.  Nutrition diagnosis:  Unintended weight loss improved.  Intervention:  I encouraged patient to continue strategies for increasing oral intake to meet estimated nutrition needs. Provided suggestions on how to improve variety of liquid diet. Teach back method used.  Monitoring, evaluation, goals: Patient will continue to work to increase oral intake to promote healing and minimize weight loss.  Next visit: Will continue to follow patient as needed.  **Disclaimer: This note was dictated with voice recognition software. Similar sounding words can inadvertently be transcribed and this note may contain transcription errors which may not have been corrected upon publication of note.**

## 2014-09-21 NOTE — Progress Notes (Signed)
[name] has completed treatment to his esophagus with 28 fractions.  He reports pain in his central chest at a 4/10.  He continues to spit up a lot of clear, foamy fluid.  More today than over the weekend.  He continues to have trouble swallowing and is only able to swallow liquids.  He reports that he has good days and bad days.  Yesterday he was able to get down 3 cans of ensure.  He is taking hycet twice a day, Carafate and Protonix. He is receiving IV fluids every day this week.  His weight is up 3 lbs from last week.  His skin is intact on his chest and back.  He reports fatigue.  hey have been given a follow up card and want to see Dr. Sondra Come in a week.  BP 166/76 mmHg  Pulse 101  Temp(Src) 98.8 F (37.1 C) (Oral)  Resp 20  Ht 5\' 11"  (1.803 m)  Wt 243 lb 1.6 oz (110.269 kg)  BMI 33.92 kg/m2  SpO2 100%

## 2014-09-22 ENCOUNTER — Ambulatory Visit (HOSPITAL_BASED_OUTPATIENT_CLINIC_OR_DEPARTMENT_OTHER): Payer: BLUE CROSS/BLUE SHIELD

## 2014-09-22 ENCOUNTER — Telehealth: Payer: Self-pay | Admitting: Nurse Practitioner

## 2014-09-22 ENCOUNTER — Ambulatory Visit (HOSPITAL_BASED_OUTPATIENT_CLINIC_OR_DEPARTMENT_OTHER): Payer: BLUE CROSS/BLUE SHIELD | Admitting: Nurse Practitioner

## 2014-09-22 ENCOUNTER — Ambulatory Visit: Payer: BLUE CROSS/BLUE SHIELD

## 2014-09-22 VITALS — BP 157/56 | HR 99 | Temp 97.8°F | Resp 18 | Ht 71.0 in | Wt 242.9 lb

## 2014-09-22 DIAGNOSIS — C155 Malignant neoplasm of lower third of esophagus: Secondary | ICD-10-CM

## 2014-09-22 DIAGNOSIS — R1310 Dysphagia, unspecified: Secondary | ICD-10-CM

## 2014-09-22 DIAGNOSIS — C159 Malignant neoplasm of esophagus, unspecified: Secondary | ICD-10-CM

## 2014-09-22 MED ORDER — HEPARIN SOD (PORK) LOCK FLUSH 100 UNIT/ML IV SOLN
500.0000 [IU] | Freq: Once | INTRAVENOUS | Status: AC
  Administered 2014-09-22: 500 [IU] via INTRAVENOUS
  Filled 2014-09-22: qty 5

## 2014-09-22 MED ORDER — SODIUM CHLORIDE 0.9 % IJ SOLN
10.0000 mL | INTRAMUSCULAR | Status: DC | PRN
  Administered 2014-09-22: 10 mL via INTRAVENOUS
  Filled 2014-09-22: qty 10

## 2014-09-22 MED ORDER — SODIUM CHLORIDE 0.9 % IV SOLN
Freq: Once | INTRAVENOUS | Status: AC
  Administered 2014-09-22: 13:00:00 via INTRAVENOUS

## 2014-09-22 NOTE — Telephone Encounter (Signed)
Gave avs & calendar for April. °

## 2014-09-22 NOTE — Patient Instructions (Signed)

## 2014-09-22 NOTE — Progress Notes (Addendum)
  [facility] OFFICE PROGRESS NOTE   Diagnosis:  Esophagus cancer  INTERVAL HISTORY:   [name] returns as scheduled. He overall is feeling better. He intermittently "spits up". Overall he is tolerating fluids. He has pain with swallowing mainly first thimg in the morning. He feels he needs to continue the IV fluids for the remainder of this week. He estimates he is taking in 1000-1600 calories per day .  Objective:  Vital signs in last 24 hours:  Blood pressure 157/56, pulse 99, temperature 97.8 F (36.6 C), temperature source Oral, resp. rate 18, height 5\' 11"  (1.803 m), weight 242 lb 14.4 oz (110.179 kg), SpO2 100 %.    HEENT: No thrush or ulcers. Resp: Lungs clear bilaterally. Cardio: Regular rate and rhythm. GI: Abdomen soft and nontender. No hepatomegaly. Vascular: No leg edema.   Lab Results:  Lab Results  Component Value Date   WBC 3.0* 09/21/2014   HGB 12.3* 09/21/2014   HCT 35.6* 09/21/2014   MCV 86.8 09/21/2014   PLT 130* 09/21/2014   NEUTROABS 2.2 09/21/2014    Imaging:  No results found.  Medications: I have reviewed the patient's current medications.  Assessment/Plan: 1. 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 No extension beyond the gastroesophageal junction into the proximal stomach. Previously noted borderline enlarged paraesophageal lymph nodes showed low level metabolic activity. Multiple areas of presumably physiologic activity throughout the small bowel and colon.  Initiation of radiation and weekly Taxol/carboplatin 08/10/2014. Last  Taxol/carboplatin 09/09/2014. Final radiation treatment 09/21/2014.  2. Solid dysphagia secondary to #1   3. History of coronary artery disease, status post coronary artery bypass surgery in 2002   4. Diabetes   5. Hypertension   6. Hyperlipidemia   7. Aortic stenosis   8.  Progressive dysphagia during the course of chemotherapy/radiation-barium esophagram 09/10/2014 confirmed a distal esophageal stricture, barium passed into the stomach.   Disposition: [name] appears improved. He has completed the course of concurrent radiation/chemotherapy. The dysphagia seems to be better. He is tolerating fluids by mouth. We will continue IV fluids for the remainder of this week and then stop.  He has an appointment scheduled with Dr. Servando Snare on 10/07/2014.  We referred him for a PET scan at an approximate 4 week interval from the completion of treatment. He will return for a follow-up visit with Dr. Benay Spice on 10/19/2014 to review the results.  He will contact the office prior to that visit with any problems.  Patient seen with Dr. Benay Spice.  Ned Card ANP/GNP-BC   09/22/2014  12:13 PM This was a shared visit with Ned Card. He has completed the course of chemotherapy/radiation and the dysphagia has improved. He should experience continued improvement over the next few weeks.  He will be referred for a restaging PET scan.  Julieanne Manson, M.D.

## 2014-09-23 ENCOUNTER — Ambulatory Visit: Payer: BLUE CROSS/BLUE SHIELD

## 2014-09-23 ENCOUNTER — Ambulatory Visit (HOSPITAL_BASED_OUTPATIENT_CLINIC_OR_DEPARTMENT_OTHER): Payer: BLUE CROSS/BLUE SHIELD

## 2014-09-23 VITALS — BP 143/66 | HR 77 | Temp 98.2°F | Resp 18

## 2014-09-23 DIAGNOSIS — E860 Dehydration: Secondary | ICD-10-CM

## 2014-09-23 DIAGNOSIS — C155 Malignant neoplasm of lower third of esophagus: Secondary | ICD-10-CM

## 2014-09-23 DIAGNOSIS — R1310 Dysphagia, unspecified: Secondary | ICD-10-CM

## 2014-09-23 MED ORDER — SODIUM CHLORIDE 0.9 % IV SOLN
INTRAVENOUS | Status: AC
  Administered 2014-09-23: 11:00:00 via INTRAVENOUS

## 2014-09-23 NOTE — Patient Instructions (Signed)

## 2014-09-24 ENCOUNTER — Ambulatory Visit (HOSPITAL_BASED_OUTPATIENT_CLINIC_OR_DEPARTMENT_OTHER): Payer: BLUE CROSS/BLUE SHIELD

## 2014-09-24 VITALS — BP 156/64 | HR 89 | Temp 98.1°F | Resp 16

## 2014-09-24 DIAGNOSIS — C155 Malignant neoplasm of lower third of esophagus: Secondary | ICD-10-CM

## 2014-09-24 DIAGNOSIS — R1310 Dysphagia, unspecified: Secondary | ICD-10-CM

## 2014-09-24 DIAGNOSIS — C159 Malignant neoplasm of esophagus, unspecified: Secondary | ICD-10-CM

## 2014-09-24 MED ORDER — SODIUM CHLORIDE 0.9 % IV SOLN
Freq: Once | INTRAVENOUS | Status: AC
  Administered 2014-09-24: 12:00:00 via INTRAVENOUS

## 2014-09-24 NOTE — Patient Instructions (Signed)
Dehydration, Adult Dehydration is when you lose more fluids from the body than you take in. Vital organs like the kidneys, brain, and heart cannot function without a proper amount of fluids and salt. Any loss of fluids from the body can cause dehydration.  CAUSES   Vomiting.  Diarrhea.  Excessive sweating.  Excessive urine output.  Fever. SYMPTOMS  Mild dehydration  Thirst.  Dry lips.  Slightly dry mouth. Moderate dehydration  Very dry mouth.  Sunken eyes.  Skin does not bounce back quickly when lightly pinched and released.  Dark urine and decreased urine production.  Decreased tear production.  Headache. Severe dehydration  Very dry mouth.  Extreme thirst.  Rapid, weak pulse (more than 100 beats per minute at rest).  Cold hands and feet.  Not able to sweat in spite of heat and temperature.  Rapid breathing.  Blue lips.  Confusion and lethargy.  Difficulty being awakened.  Minimal urine production.  No tears. DIAGNOSIS  Your caregiver will diagnose dehydration based on your symptoms and your exam. Blood and urine tests will help confirm the diagnosis. The diagnostic evaluation should also identify the cause of dehydration. TREATMENT  Treatment of mild or moderate dehydration can often be done at home by increasing the amount of fluids that you drink. It is best to drink small amounts of fluid more often. Drinking too much at one time can make vomiting worse. Refer to the home care instructions below. Severe dehydration needs to be treated at the hospital where you will probably be given intravenous (IV) fluids that contain water and electrolytes. HOME CARE INSTRUCTIONS   Ask your caregiver about specific rehydration instructions.  Drink enough fluids to keep your urine clear or pale yellow.  Drink small amounts frequently if you have nausea and vomiting.  Eat as you normally do.  Avoid:  Foods or drinks high in sugar.  Carbonated  drinks.  Juice.  Extremely hot or cold fluids.  Drinks with caffeine.  Fatty, greasy foods.  Alcohol.  Tobacco.  Overeating.  Gelatin desserts.  Wash your hands well to avoid spreading bacteria and viruses.  Only take over-the-counter or prescription medicines for pain, discomfort, or fever as directed by your caregiver.  Ask your caregiver if you should continue all prescribed and over-the-counter medicines.  Keep all follow-up appointments with your caregiver. SEEK MEDICAL CARE IF:  You have abdominal pain and it increases or stays in one area (localizes).  You have a rash, stiff neck, or severe headache.  You are irritable, sleepy, or difficult to awaken.  You are weak, dizzy, or extremely thirsty. SEEK IMMEDIATE MEDICAL CARE IF:   You are unable to keep fluids down or you get worse despite treatment.  You have frequent episodes of vomiting or diarrhea.  You have blood or green matter (bile) in your vomit.  You have blood in your stool or your stool looks black and tarry.  You have not urinated in 6 to 8 hours, or you have only urinated a small amount of very dark urine.  You have a fever.  You faint. MAKE SURE YOU:   Understand these instructions.  Will watch your condition.  Will get help right away if you are not doing well or get worse. Document Released: 06/25/2005 Document Revised: 09/17/2011 Document Reviewed: 02/12/2011 Exit[employer] Patient Information 2015 [employer]. This information is not intended to replace advice given to you by your health care provider. Make sure you discuss any questions you have with your health care   provider.  

## 2014-09-29 ENCOUNTER — Other Ambulatory Visit: Payer: Self-pay | Admitting: Radiation Oncology

## 2014-09-29 ENCOUNTER — Other Ambulatory Visit: Payer: Self-pay | Admitting: *Deleted

## 2014-09-29 ENCOUNTER — Ambulatory Visit (HOSPITAL_COMMUNITY)
Admission: RE | Admit: 2014-09-29 | Discharge: 2014-09-29 | Disposition: A | Discharge status: Home / Self Care | Payer: BLUE CROSS/BLUE SHIELD | Source: Ambulatory Visit | Attending: Oncology | Admitting: Oncology

## 2014-09-29 ENCOUNTER — Ambulatory Visit
Admission: RE | Admit: 2014-09-29 | Discharge: 2014-09-29 | Disposition: A | Discharge status: Home / Self Care | Payer: BLUE CROSS/BLUE SHIELD | Source: Ambulatory Visit | Attending: Radiation Oncology | Admitting: Radiation Oncology

## 2014-09-29 ENCOUNTER — Encounter: Payer: Self-pay | Admitting: Oncology

## 2014-09-29 VITALS — BP 157/64 | HR 107 | Temp 97.6°F | Resp 16 | Ht 71.0 in | Wt 232.7 lb

## 2014-09-29 DIAGNOSIS — E860 Dehydration: Secondary | ICD-10-CM | POA: Insufficient documentation

## 2014-09-29 DIAGNOSIS — C159 Malignant neoplasm of esophagus, unspecified: Secondary | ICD-10-CM

## 2014-09-29 HISTORY — DX: Reserved for concepts with insufficient information to code with codable children: IMO0002

## 2014-09-29 HISTORY — DX: Reserved for inherently not codable concepts without codable children: IMO0001

## 2014-09-29 MED ORDER — FENTANYL 25 MCG/HR TD PT72: 25.0000 ug | MEDICATED_PATCH | TRANSDERMAL | Status: DC

## 2014-09-29 MED ORDER — SODIUM CHLORIDE 0.9 % IV SOLN
INTRAVENOUS | Status: DC
  Filled 2014-09-29: qty 1000

## 2014-09-29 MED ORDER — LORAZEPAM 1 MG PO TABS: 1.0000 mg | ORAL_TABLET | Freq: Three times a day (TID) | ORAL | Status: DC

## 2014-09-29 MED ORDER — SODIUM CHLORIDE 0.9 % IV SOLN
INTRAVENOUS | Status: DC
  Administered 2014-09-29: 500 mL via INTRAVENOUS

## 2014-09-29 NOTE — Procedures (Signed)
Patient is alert and oriented and came for a fluid bolus. His IV was started and fluids infused with out difficulty and he tolerated procedure well with no complications. After completion, patient left with no complaints and stated he felt a little better.Marland Kitchen

## 2014-09-29 NOTE — Progress Notes (Signed)
Radiation Oncology         [phone] ________________________________  Name: [name] MRN: [id number]  Date: 09/29/2014  DOB: April 01, 1941  Follow-Up Visit Note  CC: Eulas Post, MD  Ladell Pier, MD    ICD-9-CM ICD-10-CM   1. Esophagus cancer 150.9 C15.9   2. Dehydration 276.51 E86.0 0.9 %  sodium chloride infusion    Diagnosis: uT3, N1, Mx adenocarcinoma of the distal esophagus/GE junction    Interval Since Last Radiation:  1  weeks  Narrative:  The patient returns today for close follow-up. he continues to have significant difficulty taking in liquids. Yesterday he had hardly any oral consumption as he could not keep fluids down. Today the patient was able to consume 1 insure. He denies any dizziness with standing was noted to have orthostatic blood pressure and pulse changes.                            ALLERGIES:  has No Known Allergies.  Meds: Current Outpatient Prescriptions  Medication Sig Dispense Refill  . HYDROcodone-acetaminophen (HYCET) 7.5-325 mg/15 ml solution Take 10 mLs by mouth 4 (four) times daily as needed for moderate pain. 473 mL 0  . levothyroxine (SYNTHROID, LEVOTHROID) 200 MCG tablet Take 200 mcg by mouth daily.  3  . metoprolol (LOPRESSOR) 50 MG tablet     . pantoprazole (PROTONIX) 40 MG tablet     . prochlorperazine (COMPAZINE) 10 MG tablet Take 1 tablet (10 mg total) by mouth every 6 (six) hours as needed for nausea or vomiting. 30 tablet 0  . sucralfate (CARAFATE) 1 GM/10ML suspension Take 10 mLs (1 g total) by mouth 4 (four) times daily -  with meals and at bedtime. 420 mL 1  . amLODipine-benazepril (LOTREL) 5-20 MG per capsule Take 1 capsule by mouth daily.  3  . aspirin 325 MG tablet Take 325 mg by mouth daily.    . fentaNYL (DURAGESIC - DOSED MCG/HR) 25 MCG/HR patch Place 1 patch (25 mcg total) onto the skin every 3 (three) days. 5 patch 0  . fluconazole (DIFLUCAN) 40 MG/ML suspension Take 2.5 mLs (100 mg total) by mouth daily.  Take daily for 7 days (Patient not taking: Reported on 09/29/2014) 35 mL 0  . hyaluronate sodium (RADIAPLEXRX) GEL Apply 1 application topically once.    Marland Kitchen LORazepam (ATIVAN) 1 MG tablet Take 1 tablet (1 mg total) by mouth every 8 (eight) hours. 30 tablet 0  . metFORMIN (GLUCOPHAGE) 500 MG tablet Take by mouth 2 (two) times daily with a meal.    . Multiple Vitamin (MULTIVITAMIN) tablet Take 1 tablet by mouth daily.    Glory Rosebush VERIO test strip     . valsartan-hydrochlorothiazide (DIOVAN-HCT) 80-12.5 MG per tablet Take 1 tablet by mouth daily.  1   Current Facility-Administered Medications  Medication Dose Route Frequency Provider Last Rate Last Dose  . 0.9 %  sodium chloride infusion   Intravenous Continuous Gery Pray, MD       Facility-Administered Medications Ordered in Other Encounters  Medication Dose Route Frequency Provider Last Rate Last Dose  . 0.9 %  sodium chloride infusion   Intravenous Continuous Gery Pray, MD 500 mL/hr at 09/29/14 1235 500 mL at 09/29/14 1235    Physical Findings: The patient is in no acute distress. Patient is alert and oriented.  height is 5\' 11"  (1.803 m) and weight is 232 lb 11.2 oz (105.552 kg). His oral temperature  is 97.6 F (36.4 C). His blood pressure is 157/64 and his pulse is 107. His respiration is 16 and oxygen saturation is 100%. .  The lungs are clear. The heart has a regular rhythm with increased rate. The abdomen is soft and nontender with normal bowel sounds.  Lab Findings: Lab Results  Component Value Date   WBC 3.0* 09/21/2014   HGB 12.3* 09/21/2014   HCT 35.6* 09/21/2014   MCV 86.8 09/21/2014   PLT 130* 09/21/2014    Radiographic Findings: Dg Esophagus  09/10/2014   CLINICAL DATA:  Dysphagia with intermittent regurgitation. Esophageal cancer diagnosed 2 months ago. Undergoing chemotherapy and radiation therapy. Subsequent encounter.  EXAM: ESOPHOGRAM/BARIUM SWALLOW  TECHNIQUE: Single contrast examination was performed using  water-soluble contrast and thin barium.  FLUOROSCOPY TIME:  1 minutes and 5 seconds.  COMPARISON:  PET-CT 08/07/2014.  FINDINGS: Study was initiated with Omnipaque 300 in the erect position. The esophageal motility appears normal. There is irregular narrowing of the lumen of the distal esophagus at the site of the known malignancy. However, there is no high-grade obstruction or extravasation.  Subsequently, thin barium was administered in the erect and right anterior oblique prone positions. The esophageal motility appears within normal limits. There is persistent irregular narrowing of the distal esophageal lumen. However, barium passes into the stomach. There is no extravasation.  At the conclusion of the study, a 13 mm barium tablet was administered. This passed without delay into the distal esophagus, although was retained proximal to the irregular distal esophageal stricture and did not pass into the stomach during greater than 3 minutes of intermittent fluoroscopic observation.  IMPRESSION: 1. Irregular distal esophageal stricture at site of known esophageal malignancy. This may be related to residual tumor and/or edema from radiation therapy. This prevented the passage of a 13 mm barium tablet. 2. No obstruction to passage of thin barium or extravasation demonstrated.   Electronically Signed   By: Richardean Sale M.D.   On: 09/10/2014 14:44    Impression:  The patient continues to have side effects from his radiation therapy and chemotherapy. He appears to be orthostatic today  Plan:  IV fluid supplementation 1 L. He will return in one week for further evaluation. Patient will call to more morning if the reports needing additional IV fluid supplementation. Patient was given a refill on his Duragesic  ____________________________________ Blair Promise, MD

## 2014-09-29 NOTE — Progress Notes (Signed)
Evelina Dun here for 1 week follow up.  He denies pain today.  He is taking hycet as needed and had to take it 3 times yesterday.  He reports he had 2-3 good days and then had a bad day yesterday.  He was spitting up stringing fluid and could not keep any fluids down.  They felt like there were stuck in his chest and then he would throw up.  He did get 1 ensure down.  He is not eating any solid food.  He said the feeling of getting stuck in his chest comes and goes.  Today he feels better and was able to drink a glass of lemonade and an ensure.  He has lost 10 lb since 09/22/14.  Orthostatic vitals done: bp sitting 157/64, hr 107, standing bp 126/61, hr 125.  He denies feeling dizzy when standing.  He reports fatigue.  BP 157/64 mmHg  Pulse 107  Temp(Src) 97.6 F (36.4 C) (Oral)  Resp 16  Ht 5\' 11"  (1.803 m)  Wt 232 lb 11.2 oz (105.552 kg)  BMI 32.47 kg/m2  SpO2 100%

## 2014-10-04 ENCOUNTER — Other Ambulatory Visit: Payer: Self-pay

## 2014-10-04 ENCOUNTER — Encounter: Payer: Self-pay | Admitting: Internal Medicine

## 2014-10-04 ENCOUNTER — Other Ambulatory Visit: Payer: Self-pay | Admitting: Internal Medicine

## 2014-10-04 MED ORDER — PANTOPRAZOLE SODIUM 40 MG PO TBEC: 40.0000 mg | DELAYED_RELEASE_TABLET | Freq: Every day | ORAL | Status: DC

## 2014-10-06 ENCOUNTER — Ambulatory Visit
Admission: RE | Admit: 2014-10-06 | Discharge: 2014-10-06 | Disposition: A | Discharge status: Home / Self Care | Payer: BLUE CROSS/BLUE SHIELD | Source: Ambulatory Visit | Attending: Radiation Oncology | Admitting: Radiation Oncology

## 2014-10-06 ENCOUNTER — Encounter: Payer: Self-pay | Admitting: Radiation Oncology

## 2014-10-06 VITALS — BP 155/77 | HR 116 | Temp 97.8°F | Resp 16 | Ht 71.0 in | Wt 228.3 lb

## 2014-10-06 DIAGNOSIS — C159 Malignant neoplasm of esophagus, unspecified: Secondary | ICD-10-CM

## 2014-10-06 NOTE — Progress Notes (Signed)
[name] here for follow up.  He reports that he feels the best today than he has in a long time.  He reports he is only spitting up mucus in the mornings.  He also reports some burning in his chest in the mornings that improves during the day.  He was able to take in 2000 calories the past few days and has been drinking 3 cans of boost plus, 2 [employer] frosty's, soup and Gatorade.  He has lost 4 lbs from last week.  Orthostatic vitals done: bp sitting 155/77, hr 116, bp standing 122/83, hr 146.  He reports that his stomach gets upset after drinking the boost.  He is wondering if he can start to eat solid foods.  He reports itching on his upper back.  His upper back is red.  He is using radiaplex.  BP 155/77 mmHg  Pulse 116  Temp(Src) 97.8 F (36.6 C) (Oral)  Resp 16  Ht 5\' 11"  (1.803 m)  Wt 228 lb 4.8 oz (103.556 kg)  BMI 31.86 kg/m2

## 2014-10-06 NOTE — Progress Notes (Signed)
Radiation Oncology         [phone] ________________________________  Name: [name] MRN: [id number]  Date: 10/06/2014  DOB: [birth year]  Follow-Up Visit Note  CC: Eulas Post, MD  Ladell Pier, MD    ICD-9-CM ICD-10-CM   1. Esophagus cancer 150.9 C15.9     Diagnosis: uT3, N1, Mx adenocarcinoma of the distal esophagus/GE junction   Interval Since Last Radiation:  2  weeks  Narrative:  The patient returns today for routine follow-up.  He has made significant improvement over the past week. The patient is taking in 3 cans of boost plus. He also consumed a Abigail Butts frosty and Gatorade over the past 24 hours. He denies any significant swallowing discomfort at this time. He is getting his appetite back. Patient was inquiring about soft foods and I recommended he proceed with soft foods and then progress to solid foods as tolerated.                              ALLERGIES:  has No Known Allergies.  Meds: Current Outpatient Prescriptions  Medication Sig Dispense Refill  . levothyroxine (SYNTHROID, LEVOTHROID) 200 MCG tablet Take 200 mcg by mouth daily.  3  . metoprolol (LOPRESSOR) 50 MG tablet     . pantoprazole (PROTONIX) 40 MG tablet     . pantoprazole (PROTONIX) 40 MG tablet TAKE 1 TABLET (40 MG TOTAL) BY MOUTH DAILY. 30 tablet 2  . pantoprazole (PROTONIX) 40 MG tablet Take 1 tablet (40 mg total) by mouth daily. 90 tablet 3  . amLODipine-benazepril (LOTREL) 5-20 MG per capsule Take 1 capsule by mouth daily.  3  . aspirin 325 MG tablet Take 325 mg by mouth daily.    . fentaNYL (DURAGESIC - DOSED MCG/HR) 25 MCG/HR patch Place 1 patch (25 mcg total) onto the skin every 3 (three) days. (Patient not taking: Reported on 10/06/2014) 5 patch 0  . fluconazole (DIFLUCAN) 40 MG/ML suspension Take 2.5 mLs (100 mg total) by mouth daily. Take daily for 7 days (Patient not taking: Reported on 09/29/2014) 35 mL 0  . hyaluronate sodium (RADIAPLEXRX) GEL Apply 1 application topically  once.    Marland Kitchen HYDROcodone-acetaminophen (HYCET) 7.5-325 mg/15 ml solution Take 10 mLs by mouth 4 (four) times daily as needed for moderate pain. (Patient not taking: Reported on 10/06/2014) 473 mL 0  . LORazepam (ATIVAN) 1 MG tablet Take 1 tablet (1 mg total) by mouth every 8 (eight) hours. (Patient not taking: Reported on 10/06/2014) 30 tablet 0  . metFORMIN (GLUCOPHAGE) 500 MG tablet Take by mouth 2 (two) times daily with a meal.    . Multiple Vitamin (MULTIVITAMIN) tablet Take 1 tablet by mouth daily.    Glory Rosebush VERIO test strip     . prochlorperazine (COMPAZINE) 10 MG tablet Take 1 tablet (10 mg total) by mouth every 6 (six) hours as needed for nausea or vomiting. (Patient not taking: Reported on 10/06/2014) 30 tablet 0  . sucralfate (CARAFATE) 1 GM/10ML suspension Take 10 mLs (1 g total) by mouth 4 (four) times daily -  with meals and at bedtime. (Patient not taking: Reported on 10/06/2014) 420 mL 1  . valsartan-hydrochlorothiazide (DIOVAN-HCT) 80-12.5 MG per tablet Take 1 tablet by mouth daily.  1   No current facility-administered medications for this encounter.    Physical Findings: The patient is in no acute distress. Patient is alert and oriented.  height is 5\' 11"  (1.803 m)  and weight is 228 lb 4.8 oz (103.556 kg). His oral temperature is 97.8 F (36.6 C). His blood pressure is 155/77 and his pulse is 116. His respiration is 16. Marland Kitchen  No palpable supraclavicular or axillary adenopathy. The lungs are clear to auscultation. The abdomen is soft and nontender with normal bowel sounds.  Lab Findings: Lab Results  Component Value Date   WBC 3.0* 09/21/2014   HGB 12.3* 09/21/2014   HCT 35.6* 09/21/2014   MCV 86.8 09/21/2014   PLT 130* 09/21/2014    Radiographic Findings: Dg Esophagus  09/10/2014   CLINICAL DATA:  Dysphagia with intermittent regurgitation. Esophageal cancer diagnosed 2 months ago. Undergoing chemotherapy and radiation therapy. Subsequent encounter.  EXAM: ESOPHOGRAM/BARIUM  SWALLOW  TECHNIQUE: Single contrast examination was performed using water-soluble contrast and thin barium.  FLUOROSCOPY TIME:  1 minutes and 5 seconds.  COMPARISON:  PET-CT 08/07/2014.  FINDINGS: Study was initiated with Omnipaque 300 in the erect position. The esophageal motility appears normal. There is irregular narrowing of the lumen of the distal esophagus at the site of the known malignancy. However, there is no high-grade obstruction or extravasation.  Subsequently, thin barium was administered in the erect and right anterior oblique prone positions. The esophageal motility appears within normal limits. There is persistent irregular narrowing of the distal esophageal lumen. However, barium passes into the stomach. There is no extravasation.  At the conclusion of the study, a 13 mm barium tablet was administered. This passed without delay into the distal esophagus, although was retained proximal to the irregular distal esophageal stricture and did not pass into the stomach during greater than 3 minutes of intermittent fluoroscopic observation.  IMPRESSION: 1. Irregular distal esophageal stricture at site of known esophageal malignancy. This may be related to residual tumor and/or edema from radiation therapy. This prevented the passage of a 13 mm barium tablet. 2. No obstruction to passage of thin barium or extravasation demonstrated.   Electronically Signed   By: Richardean Sale M.D.   On: 09/10/2014 14:44    Impression:  The patient is recovering from the effects of radiation.  He is much improved over the past week.  Plan:  Follow-up with thoracic surgery in the near future. The patient will be evaluated for surgery. His case will likely be presented at the multidisciplinary thoracic oncology conference once his imaging is complete  ____________________________________ Blair Promise, MD

## 2014-10-07 ENCOUNTER — Other Ambulatory Visit: Payer: Self-pay | Admitting: *Deleted

## 2014-10-07 ENCOUNTER — Telehealth: Payer: Self-pay | Admitting: Cardiology

## 2014-10-07 ENCOUNTER — Encounter: Payer: Self-pay | Admitting: Cardiothoracic Surgery

## 2014-10-07 ENCOUNTER — Ambulatory Visit (INDEPENDENT_AMBULATORY_CARE_PROVIDER_SITE_OTHER): Payer: BLUE CROSS/BLUE SHIELD | Admitting: Cardiothoracic Surgery

## 2014-10-07 VITALS — Ht 71.0 in | Wt 228.0 lb

## 2014-10-07 DIAGNOSIS — C159 Malignant neoplasm of esophagus, unspecified: Secondary | ICD-10-CM | POA: Diagnosis not present

## 2014-10-07 DIAGNOSIS — I350 Nonrheumatic aortic (valve) stenosis: Secondary | ICD-10-CM

## 2014-10-07 DIAGNOSIS — Z923 Personal history of irradiation: Secondary | ICD-10-CM | POA: Diagnosis not present

## 2014-10-07 NOTE — Telephone Encounter (Signed)
Jenny Reichmann is calling from Dr.Edward Gerhardt's office to see if Dr. Stanford Breed can work the pt in to be seen for a preop clearance for a esophogeal resection. Please call back   Thanks   *referral is in Epic*

## 2014-10-07 NOTE — Telephone Encounter (Signed)
Pt last seen aug 2015 for OV -> 1 yr f/u.   Will route to see if he can be added for a surgical clearance appt.

## 2014-10-07 NOTE — Progress Notes (Signed)
[name]       , [id number]             [id number]                    [name] [place] Medical Record #[id number] Date of Birth: 1940-07-10  Referring: Jerene Bears, MD Primary Care: Eulas Post, MD  Chief Complaint:    Chief Complaint  Patient presents with  . Esophageal Cancer    sugical eval, Chest/ABD/Pelvis CT 07/13/14, upper endoscopy 07/12/2014    History of Present Illness:    [name] 74 y.o. 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 The pateint had  endoscopy because 4-5 months of increasing epigastric pain and trouble swallowing., no blood in stool. S A pre treatment  PET scan and esophageal ultrasound has been performed. Patient has been seen by medical and radiation oncology and completed  chemotherapy on a weekly basis and radiation which was to complete  March 10 but was delayed to poor overall status. Over the past several days he has been able to increase his by mouth intake including some soft solids. Overall he is had approximately 30 pound weight loss. He is now to have a PET scan done April 11 for restaging.    Current Activity/ Functional Status:  Patient is independent with mobility/ambulation, transfers, ADL's, IADL's.   Zubrod Score: At the time of surgery this patient's most appropriate activity status/level should be described as: []     0    Normal activity, no symptoms [x]     1    Restricted in physical strenuous activity but ambulatory, able to do out light work []     2    Ambulatory and capable of self care, unable to do work activities, up and about               >50 % of waking hours                              []     3    Only limited self care, in bed greater than 50% of waking hours []     4    Completely disabled, no self care, confined to bed or chair []     5    Moribund   Past Medical History  Diagnosis Date  . HYPOTHYROIDISM  05/08/2010  . DM 05/08/2010  . HYPERLIPIDEMIA 05/08/2010  . HYPERTENSION, BENIGN 04/13/2008  . CAD, NATIVE VESSEL 04/13/2008  . Aortic stenosis   . Gall bladder stones 07/20/2014  . Hx of CABG 07/20/2014    CAD with history of coronary artery bypass graft 2002   . Esophageal cancer   . Radiation 08/10/14-09/21/14    50.4 gray to esophagus    Past Surgical History  Procedure Laterality Date  . Coronary artery bypass graft  2002  . Eus N/A 07/22/2014    Procedure: UPPER ENDOSCOPIC ULTRASOUND (EUS) LINEAR;  Surgeon: Milus Banister, MD;  Location: [facility];  Service: Endoscopy;  Laterality: N/A;  . Esophagogastroduodenoscopy endoscopy  07/12/14  CABG by Dr Cyndia Bent in 2002  Family History  Problem Relation Age of Onset  . Coronary artery disease Mother   . Diabetes type II Mother     diabetes type ll  . Hypertension Mother   . Diabetes  Mother     type ll  . Coronary artery disease Brother   . Alcohol abuse Father   . Colon cancer Father 18   Father  Died 86  With colon cancer, mother died 10  With RA 1 weeks after cabg never was discharged home 2 sons healthy  History   Social History  . Marital Status: Married    Spouse Name: N/A    Number of Children: N/A  . Years of Education: N/A   Occupational History  . Retired English as a second language teacher    Social History Main Topics  . Smoking status: Former Smoker -- 1.00 packs/day for 30 years    Types: Cigarettes    Quit date: 07/30/1999  . Smokeless tobacco: Former Systems developer    Quit date: 07/18/2000  . Alcohol Use: 0.0 oz/week    0 Not specified per week     Comment: rare  . Drug Use: No  . Sexual Activity: Not on file     Comment: regular exercise      History  Smoking status  . Former Smoker -- 1.00 packs/day for 30 years  . Types: Cigarettes  . Quit date: 07/30/1999  Smokeless tobacco  . Former Systems developer  . Quit date: 07/18/2000    History  Alcohol Use  . 0.0 oz/week  . 0 Standard drinks or equivalent per week    Comment: rare - used  to drink daily before 2001     No Known Allergies  Current Outpatient Prescriptions  Medication Sig Dispense Refill  . amLODipine-benazepril (LOTREL) 5-20 MG per capsule Take 1 capsule by mouth daily.  3  . aspirin 325 MG tablet Take 325 mg by mouth daily.    . fentaNYL (DURAGESIC - DOSED MCG/HR) 25 MCG/HR patch Place 1 patch (25 mcg total) onto the skin every 3 (three) days. (Patient not taking: Reported on 10/06/2014) 5 patch 0  . fluconazole (DIFLUCAN) 40 MG/ML suspension Take 2.5 mLs (100 mg total) by mouth daily. Take daily for 7 days (Patient not taking: Reported on 09/29/2014) 35 mL 0  . hyaluronate sodium (RADIAPLEXRX) GEL Apply 1 application topically once.    Marland Kitchen HYDROcodone-acetaminophen (HYCET) 7.5-325 mg/15 ml solution Take 10 mLs by mouth 4 (four) times daily as needed for moderate pain. (Patient not taking: Reported on 10/06/2014) 473 mL 0  . levothyroxine (SYNTHROID, LEVOTHROID) 200 MCG tablet Take 200 mcg by mouth daily.  3  . LORazepam (ATIVAN) 1 MG tablet Take 1 tablet (1 mg total) by mouth every 8 (eight) hours. (Patient not taking: Reported on 10/06/2014) 30 tablet 0  . metFORMIN (GLUCOPHAGE) 500 MG tablet Take by mouth 2 (two) times daily with a meal.    . metoprolol (LOPRESSOR) 50 MG tablet     . Multiple Vitamin (MULTIVITAMIN) tablet Take 1 tablet by mouth daily.    Glory Rosebush VERIO test strip     . pantoprazole (PROTONIX) 40 MG tablet     . pantoprazole (PROTONIX) 40 MG tablet TAKE 1 TABLET (40 MG TOTAL) BY MOUTH DAILY. 30 tablet 2  . pantoprazole (PROTONIX) 40 MG tablet Take 1 tablet (40 mg total) by mouth daily. 90 tablet 3  . prochlorperazine (COMPAZINE) 10 MG tablet Take 1 tablet (10 mg total) by mouth every 6 (six) hours as needed for nausea or vomiting. (Patient not taking: Reported on 10/06/2014) 30 tablet 0  . sucralfate (CARAFATE) 1 GM/10ML suspension Take 10 mLs (1 g total) by mouth 4 (four) times daily -  with meals and at  bedtime. (Patient not taking: Reported on  10/06/2014) 420 mL 1  . valsartan-hydrochlorothiazide (DIOVAN-HCT) 80-12.5 MG per tablet Take 1 tablet by mouth daily.  1   No current facility-administered medications for this visit.     Review of Systems:     Cardiac Review of Systems: Y or N  Chest Pain [ N   ]  Resting SOB [ N  ] Exertional SOB  [ N ]  Orthopnea [ N ]   Pedal Edema Aqua.Slicker   ]    Palpitations Aqua.Slicker  ] Syncope  [  ]   Presyncope [   ]  General Review of Systems: [Y] = yes [  ]=no Constitional: recent weight change [Y  ];  Wt loss over the last 3 months [  12lbs ] anorexia [  ]; fatigue [  ]; nausea [  ]; night sweats [  ]; fever [  ]; or chills [  ];          Dental: poor dentition[  ]; Last Dentist visit:   Eye : blurred vision [  ]; diplopia [   ]; vision changes [  ];  Amaurosis fugax[  ]; Resp: cough [  ];  wheezing[ N ];  hemoptysis[  ]; shortness of breath[  ]; paroxysmal nocturnal dyspnea[ N ]; GU: kidney stones [  ]; hematuria[  ];   dysuria [  ];  nocturia[  ];  history of     obstruction [  ]; urinary frequency [  ]             Skin: rash, swelling[  ];, hair loss[  ];  peripheral edema[  ];  or itching[  ]; Musculosketetal: myalgias[  N];  joint swelling[ [place]];  joint erythema[Y  ];  joint pain[  ];  back pain[  ];  Heme/Lymph: bruising[  ];  bleeding[  ];  anemia[  ];  Neuro: TIA[  ];  headaches[  ];  stroke[  ];  vertigo[  ];  seizures[  ];   paresthesias[  ];  difficulty walking[  ];  Psych:depression[  ]; anxiety[  ];  Endocrine: diabetes[Y  ];  thyroid dysfunction[  ];  Immunizations: Flu up to date [ Y ]; Pneumococcal up to date Jazmín.Cullens  ];  Other:  Physical Exam: Ht 5\' 11"  (1.803 m)  Wt 228 lb (103.42 kg)  BMI 31.81 kg/m2  SpO2   PHYSICAL EXAMINATION:  General appearance: alert, cooperative, appears stated age and no distress Neurologic: intact Heart: regular rate and rhythm, S1, S2 normal, early systolic 2/6  murmur, click, rub or gallop Lungs: clear to auscultation bilaterally Abdomen: soft,  non-tender; bowel sounds normal; no masses,  no organomegaly Extremities: extremities normal, atraumatic, no cyanosis or edema and Homans sign is negative, no sign of DVT No carotid bruits Full brachial radial, femoral pedal pulses No cervical axillary or supraclavicular adneopathy   Diagnostic Studies & Laboratory data:    Nm Pet Image Initial (pi) Skull Base To Thigh  08/09/2014   CLINICAL DATA:  Initial treatment strategy for esophageal cancer.  EXAM: NUCLEAR MEDICINE PET SKULL BASE TO THIGH  TECHNIQUE: 13.7 mCi F-18 FDG was injected intravenously. Full-ring PET imaging was performed from the skull base to thigh after the radiotracer. CT data was obtained and used for attenuation correction and anatomic localization.  FASTING BLOOD GLUCOSE:  Value: 105 mg/dl  COMPARISON:  CT of the chest, abdomen and pelvis 07/13/2014.  FINDINGS: NECK  No hypermetabolic lymph  nodes in the neck.  CHEST  The previously described mass in the distal esophagus is similar to the prior examination measuring up to 3.3 x 2.8 cm on axial images, and is hypermetabolic (SUVmax = 8.5). This hypermetabolism extends to the level just above the gastroesophageal junction, but does not appear to extend into the proximal stomach. Previously described distal right paraesophageal lymph node is stable in size measuring 7 mm, and demonstrates low-level metabolic activity (SUVmax = 2.0). The previously described prominent left paraesophageal lymph node is borderline enlarged (9 mm), but demonstrates no convincing hypermetabolism (SUVmax = 1.5). No other suspicious hypermetabolic mediastinal lymph nodes are noted. No suspicious pulmonary nodules on the CT scan. Heart size is borderline enlarged. There is atherosclerosis of the thoracic aorta, the great vessels of the mediastinum and the coronary arteries, including calcified atherosclerotic plaque in the left main, left anterior descending and right coronary arteries. Status post median  sternotomy for CABG, including LIMA to the LAD. Severe calcifications of the aortic valve. No consolidative airspace disease. No pleural effusions. Some expansion of extrapleural fat in the base of the left hemithorax is incidentally noted.  ABDOMEN/PELVIS  There are multiple areas of increased metabolic activity throughout the visualized small bowel and colon, the majority of which are favored to be physiologic. Two of these areas are slightly more conspicuous in the distal rectum (SUVmax = 5.0)and at the anal rectal junction (SUVmax = 6.8), however, neither of these regions have definite masslike characteristics. No abnormal hypermetabolic activity within the liver, pancreas, adrenal glands, or spleen. No hypermetabolic lymph nodes in the abdomen or pelvis. 12 mm calcified gallstone lying dependently in the gallbladder. Multiple left-sided renal lesions are again noted, largest of which measures 6.3 x 4.7 cm, previously characterized as a simple cyst. Extensive atherosclerosis throughout the abdominal and pelvic vasculature.  SKELETON  No focal hypermetabolic activity to suggest skeletal metastasis.  IMPRESSION: 1. Distal esophageal mass is similar to the prior examination and is hypermetabolic, again concerning for primary esophageal neoplasm. No extension beyond the gastroesophageal junction into the proximal stomach identified on today's examination. Previously noted borderline enlarged paraesophageal lymph nodes demonstrate only low-level metabolic activity. This is nonspecific, however, given their small size and there proximity to this esophageal lesion, the possibility of early lymphatic metastasis is not excluded. 2. Multiple areas of presumably physiologic activity throughout the small bowel and colon. 2 areas in the distal rectum and at the anorectal junction are slightly more conspicuous than others, as discussed above, although these have no definite mass-like characteristics to strongly suggest  neoplasm. Correlation with physical examination and sigmoidoscopy is suggested if not recently performed. 3. Atherosclerosis, including left main and 2 vessel coronary artery disease. Status post median sternotomy for CABG, including LIMA to the LAD. 4. There are calcifications of the aortic valve. Echocardiographic correlation for evaluation of potential valvular dysfunction may be warranted if clinically indicated. 5. Additional incidental findings, as above.   Electronically Signed   By: Vinnie Langton M.D.   On: 08/09/2014 08:30      Recent Radiology Findings:  CT chest abdomen 07/13/2014   CLINICAL DATA:  Dysphasia for several months.  Esophageal mass.  EXAM: CT CHEST, ABDOMEN, AND PELVIS WITH CONTRAST  TECHNIQUE: Multidetector CT imaging of the chest, abdomen and pelvis was performed following the standard protocol during bolus administration of intravenous contrast.  CONTRAST:  174mL OMNIPAQUE IOHEXOL 300 MG/ML  SOLN  COMPARISON:  None  FINDINGS: CT CHEST FINDINGS  Mediastinum: Normal heart size. No  pericardial effusion. Previous median sternotomy and CABG procedure. Normal appearance of the trachea. The proximal and mid esophagus appears mildly dilated and patulous. Distal esophageal mass measures 3 x 2.0 x 4.2 cm. There is a an adjacent lymph node measuring 7 mm, image 42/series 2. 1 cm lymph node is identified between the esophagus and descending thoracic aorta at the level of the left mainstem bronchus. No right paratracheal or sub- carinal adenopathy identified. There is no hilar adenopathy.  Lungs/Pleura: No pleural effusion identified. There is no suspicious pulmonary nodule or mass.  Musculoskeletal: Mild thoracic spondylosis identified. No aggressive lytic or sclerotic bone lesions.  CT ABDOMEN AND PELVIS FINDINGS  Hepatobiliary: There is no focal liver abnormality. Gallstone measures 1.5 cm, image 61/series 2. No biliary dilatation.  Pancreas: Normal appearance of the pancreas.  Spleen:  Negative.  Adrenals/Urinary Tract: The adrenal glands are normal. Normal appearance of the right kidney. Left renal cysts identified. The urinary bladder appears normal  Stomach/Bowel: The stomach is normal. The small bowel loops have a normal course and caliber without obstruction. Normal appearance of the colon.  Vascular/Lymphatic: Calcified atherosclerotic disease involves the abdominal aorta. No aneurysm. Multiple prominent upper abdominal lymph nodes. Most of these measure less than 1 cm in short axis. Gastrohepatic ligament lymph node measures 1 cm, image 57/series 2. No retroperitoneal adenopathy. No pelvic or inguinal adenopathy.  Reproductive: Prostate gland appears mildly enlarged. Symmetric appearance of the seminal vesicles.  Other: There is no ascites or focal fluid collections within the abdomen or pelvis.  Musculoskeletal: Review of the visualized osseous structures is negative for aggressive lytic or sclerotic bone lesion. Degenerative disc disease is noted within the lower lumbar spine.  IMPRESSION: 1. Distal esophageal mass concerning for primary esophageal carcinoma. 2. Prominent posterior mediastinal lymph nodes measure up to 1 cm. Within the upper abdomen there are multiple small lymph nodes. The largest is in the gastrohepatic ligament region measuring 1 cm. Consider further evaluation with PET-CT. 3. Atherosclerotic disease. 4. Gallstone.   Electronically Signed   By: Kerby Moors M.D.   On: 07/13/2014 15:39   I have independently reviewed the above radiology studies  and reviewed the findings with the patient.     Recent Lab Findings: Lab Results  Component Value Date   WBC 3.0* 09/21/2014   HGB 12.3* 09/21/2014   HCT 35.6* 09/21/2014   PLT 130* 09/21/2014   GLUCOSE 102 09/21/2014   CHOL 120 10/27/2013   TRIG 246.0* 10/27/2013   HDL 33.90* 10/27/2013   LDLDIRECT 57.7 08/24/2011   LDLCALC 37 10/27/2013   ALT 19 09/21/2014   AST 20 09/21/2014   NA 145 09/21/2014   K 3.6  09/21/2014   CL 104 07/12/2014   CREATININE 1.1 09/21/2014   BUN 9.5 09/21/2014   CO2 26 09/21/2014   TSH 0.79 10/27/2013   HGBA1C 7.1* 04/28/2014     ESOPHAGUS: A half circumferential fungating mass was found in the distal esophagus at 38 cm extending to the GE junction at 43 cm. The lesion narrows the esophageal lumen, but the standard adult upper endoscope passes the tumor without difficulty. Multiple biopsies were performed using cold forceps. There appears to be circumferential Barrett's mucosa extending up to the top of the tumor. STOMACH: The mucosa of the stomach appeared normal with small hiatal hernia. DUODENUM: Mild duodenal inflammation was found in the duodenal bulb and sweep. The 2nd portion of the duodenum was normal. Retroflexed views revealed a hiatal hernia. The scope was then withdrawn from  the patient and the procedure completed. COMPLICATIONS: There were no immediate complications. ENDOSCOPIC IMPRESSION: 1. 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 Small hiatal hernia 2. The mucosa of the stomach appeared normal 3. Mild peptic duodenitis was found in the duodenal bulb Assessment / Plan:    Path:   Diagnosis Esophagus, biopsy, distal - ADENOCARCINOMA, SEE COMMENT. Microscopic Comment The adenocarcinoma is arising in a background of high grade glandular dysplasia. The adenocarcinoma involves at least lamina propria. Although there is smooth muscle involvement by tumor, hypertrophic and redundant muscularis mucosa is known to occur in Barrett's esophagus. The case was reviewed with Dr. Saralyn Pilar who concurs. The case was discussed with Dr. Hilarie Fredrickson on 07/13/2014. (CR:kh 07/13/14) Mali RUND DO Pathologist, Electronic Signature EUS: Endoscopic findings: 1. Non-cirumferential but bulky tumor in distal esophagus. This was 6cm long, the distal end was a the GE junction. It  was slightly difficult to maneuver the large diameter echoendoscopes past the mass. EUS findings: 1. The mass above corresponded with a hypoechoic, heterogneous mass that clearly passed into and through the muscularis propria layer of the esophageal wall (uT3). 2. There were 2 hypoechoic lymphnodes adjacent to the proximal edge of the primary tumor that were suspicious for malignancy (homogeneous, round, largest was 9.42mm) (uN1). 3. I did not appreciate other adenopathy (at gastrohepatic ligament). 4. 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 I'll plan to see the patient back the week of April 18 after he has had restaging PET scan to make a final decision concerning surgical resection We will arrange for cardiology  preoperative risk evaluation for surgery with his known coronary occlusive disease having bypass surgery in 2001 and murmur of aortic stenosis. He is followed by Dr. Stanford Breed for this.   Care been coordinated with Dr. Benay Spice and Dr. Dwana Curd.  I spent 30 minutes counseling the patient face to face. The total time spent in the appointment was 40 minutes.  Grace Isaac MD      [address] [place] [address] Office [phone]   Beeper [phone]  10/07/2014 10:46 AM

## 2014-10-07 NOTE — Telephone Encounter (Signed)
PAOV for clearance Kirk Ruths

## 2014-10-08 NOTE — Telephone Encounter (Signed)
Spoke with cindy, aware patient can see scott weaver pa-c 10-11-14 @ 10:30 am at [address] office. Jenny Reichmann is going to contact the patient with the appointment.

## 2014-10-09 ENCOUNTER — Encounter: Payer: Self-pay | Admitting: Radiation Oncology

## 2014-10-09 NOTE — Progress Notes (Signed)
  Radiation Oncology         [phone] ________________________________  Name: [name] MRN: [id number]  Date: 10/09/2014  DOB: 04-30-1941  End of Treatment Note  Diagnosis: uT3, N1, Mx adenocarcinoma of the distal esophagus/GE junction       Indication for treatment:  Preoperative along with radiosensitizing chemotherapy       Radiation treatment dates:   February 2 through March 15  Site/dose:   Lower esophageal area, 50.4 gray in 28 fractions  Beams/energy:   Intensity modulated radiation therapy, volumetric arc therapy, 15 and 6x beams  Narrative: The patient tolerated radiation treatment relatively well.   He persisted in having difficulties with swallowing throughout his course of treatment requiring IV fluid supplementation towards end of his therapy.Marland Kitchen He also required narcotic pain medications for this issue as well as several other medications.  Plan: The patient has completed radiation treatment. The patient will return to radiation oncology clinic for routine followup in one week. I advised them to call or return sooner if they have any questions or concerns related to their recovery or treatment.  -----------------------------------  Blair Promise, PhD, MD

## 2014-10-11 ENCOUNTER — Encounter: Payer: Self-pay | Admitting: Physician Assistant

## 2014-10-11 ENCOUNTER — Telehealth: Payer: Self-pay | Admitting: *Deleted

## 2014-10-11 ENCOUNTER — Telehealth: Payer: Self-pay | Admitting: Oncology

## 2014-10-11 ENCOUNTER — Ambulatory Visit (INDEPENDENT_AMBULATORY_CARE_PROVIDER_SITE_OTHER): Payer: BLUE CROSS/BLUE SHIELD | Admitting: Physician Assistant

## 2014-10-11 VITALS — BP 130/68 | HR 94 | Ht 71.0 in | Wt 228.0 lb

## 2014-10-11 DIAGNOSIS — C159 Malignant neoplasm of esophagus, unspecified: Secondary | ICD-10-CM

## 2014-10-11 DIAGNOSIS — I350 Nonrheumatic aortic (valve) stenosis: Secondary | ICD-10-CM

## 2014-10-11 DIAGNOSIS — Z01810 Encounter for preprocedural cardiovascular examination: Secondary | ICD-10-CM

## 2014-10-11 DIAGNOSIS — I10 Essential (primary) hypertension: Secondary | ICD-10-CM

## 2014-10-11 DIAGNOSIS — I2510 Atherosclerotic heart disease of native coronary artery without angina pectoris: Secondary | ICD-10-CM

## 2014-10-11 DIAGNOSIS — E785 Hyperlipidemia, unspecified: Secondary | ICD-10-CM

## 2014-10-11 MED ORDER — VALSARTAN-HYDROCHLOROTHIAZIDE 80-12.5 MG PO TABS: 1.0000 | ORAL_TABLET | Freq: Every day | ORAL | Status: DC

## 2014-10-11 NOTE — Patient Instructions (Signed)
Your physician has requested that you have a lexiscan myoview. For further information please visit [email or web address]. Please follow instruction sheet, as given.  Your physician has recommended you make the following change in your medication: INCREASE METOPROLOL TO 50 MG TWICE DAILY  KEEP YOUR APPT WITH DR. CRENSHAW AS PLANNED

## 2014-10-11 NOTE — Telephone Encounter (Signed)
This was a medical record request. Message sent to Maudie Mercury in HIM to forward records.

## 2014-10-11 NOTE — Progress Notes (Signed)
Cardiology Office Note   Date:  10/11/2014   ID:  [name], DOB [birth year], MRN [id number]  PCP:  Eulas Post, MD  Cardiologist:  Dr. Kirk Ruths     Chief Complaint  Patient presents with  . Coronary Artery Disease    SURGERY CLEARANCE     History of Present Illness: [name] is a 74 y.o. male with a hx of CAD status post CABG 4 in 2002, HTN, HL, aortic stenosis. Last seen by Dr. Stanford Breed 02/2014.  Follow-up echocardiogram demonstrated mild aortic stenosis with a mean gradient of 13 mmHg. Last stress test in August 2013 demonstrated no ischemia or scar.  He was recently evaluated by Dr. Servando Snare for advanced esophageal cancer.  He is referred back for preoperative evaluation for surgical resection.    Prior to starting chemotherapy and radiation several weeks ago, the patient was doing quite well. He denies chest pain, shortness of breath, syncope, orthopnea, PND or edema. He had remained quite active. Of note, he tells me he had absolutely no symptoms prior to his bypass surgery in 2002. Over the last several weeks, he has had difficulty eating and swallowing. His oncologist recently stopped all his medications except for his beta blocker, Synthroid and Protonix.  Studies/Reports Reviewed Today:  Echo 9/15 - Left ventricle: The cavity size was normal. Systolic function was   normal. The estimated ejection fraction was in the range of 55%   to 60%. Wall motion was normal; there were no regional wall   motion abnormalities. - Aortic valve: There was mild stenosis. Valve area (VTI): 2.47   cm^2. Valve area (Vmax): 2.49 cm^2. Valve area (Vmean): 2.35   cm^2. Mean 13 mmHg - Left atrium: The atrium was mildly dilated. - Atrial septum: No defect or patent foramen ovale was identified.  Myoview 8/13 Overall Impression:  Low risk stress nuclear study. No evidence of ischemia or scar. Hypertensive response to exercise. Resting STT changes increase during  exercise.  LV Ejection Fraction: 56%.  LV Wall Motion:  NL LV Function; NL Wall Motion   Past Medical History  Diagnosis Date  . HYPOTHYROIDISM 05/08/2010  . DM 05/08/2010  . HYPERLIPIDEMIA 05/08/2010  . HYPERTENSION, BENIGN 04/13/2008  . CAD, NATIVE VESSEL 04/13/2008  . Aortic stenosis   . Gall bladder stones 07/20/2014  . Hx of CABG 07/20/2014    CAD with history of coronary artery bypass graft 2002   . Esophageal cancer   . Radiation 08/10/14-09/21/14    50.4 gray to esophagus    Past Surgical History  Procedure Laterality Date  . Coronary artery bypass graft  2002  . Eus N/A 07/22/2014    Procedure: UPPER ENDOSCOPIC ULTRASOUND (EUS) LINEAR;  Surgeon: Milus Banister, MD;  Location: [facility];  Service: Endoscopy;  Laterality: N/A;  . Esophagogastroduodenoscopy endoscopy  07/12/14     Current Outpatient Prescriptions  Medication Sig Dispense Refill  . amLODipine-benazepril (LOTREL) 5-20 MG per capsule Take 1 capsule by mouth daily.  3  . aspirin 325 MG tablet Take 325 mg by mouth daily.    Marland Kitchen ezetimibe-simvastatin (VYTORIN) 10-40 MG per tablet Take 1 tablet by mouth daily.    Marland Kitchen levothyroxine (SYNTHROID, LEVOTHROID) 200 MCG tablet Take 200 mcg by mouth daily.  3  . metFORMIN (GLUCOPHAGE) 500 MG tablet Take by mouth 2 (two) times daily with a meal.    . metoprolol (LOPRESSOR) 50 MG tablet     . Multiple Vitamin (MULTIVITAMIN) tablet Take 1 tablet by  mouth daily.    Glory Rosebush VERIO test strip     . pantoprazole (PROTONIX) 40 MG tablet Take 1 tablet (40 mg total) by mouth daily. 90 tablet 3  . valsartan-hydrochlorothiazide (DIOVAN-HCT) 80-12.5 MG per tablet Take 1 tablet by mouth daily.  1   No current facility-administered medications for this visit.    Allergies:   Review of patient's allergies indicates no known allergies.    Social History:  The patient  reports that he quit smoking about 15 years ago. His smoking use included Cigarettes. He has a 30 pack-year smoking  history. He quit smokeless tobacco use about 14 years ago. He reports that he drinks alcohol. He reports that he does not use illicit drugs.   Family History:  The patient's family history includes Alcohol abuse in his father; Colon cancer (age of onset: 33) in his father; Coronary artery disease in his brother and mother; Diabetes in his mother; Diabetes type II in his mother; Hypertension in his mother. There is no history of Heart attack or Stroke.    ROS:   Please see the history of present illness.   Review of Systems  Gastrointestinal: Positive for dysphagia.  All other systems reviewed and are negative.    PHYSICAL EXAM: VS:  BP 130/68 mmHg  Pulse 94  Ht 5\' 11"  (1.803 m)  Wt 228 lb (103.42 kg)  BMI 31.81 kg/m2    Wt Readings from Last 3 Encounters:  10/11/14 228 lb (103.42 kg)  10/07/14 228 lb (103.42 kg)  09/29/14 232 lb 11.2 oz (105.552 kg)     GEN: Well nourished, well developed, in no acute distress HEENT: normal Neck: no JVD, no masses Cardiac:  Normal J8/S5, RRR; 1-2 systolic murmur RUSB,  no rubs or gallops, no edema  Respiratory:  clear to auscultation bilaterally, no wheezing, rhonchi or rales. GI: soft, nontender, nondistended, + BS MS: no deformity or atrophy Skin: warm and dry  Neuro:  CNs II-XII intact, Strength and sensation are intact Psych: Normal affect   EKG:  EKG is ordered today.  It demonstrates:   NSR, HR 94, LVH with repolarization abnormality, no change from prior tracings   Recent Labs: 10/27/2013: TSH 0.79 09/21/2014: ALT 19; BUN 9.5; Creatinine 1.1; Hemoglobin 12.3*; Magnesium 1.7; Platelets 130*; Potassium 3.6; Sodium 145    Lipid Panel    Component Value Date/Time   CHOL 120 10/27/2013 0851   TRIG 246.0* 10/27/2013 0851   HDL 33.90* 10/27/2013 0851   CHOLHDL 4 10/27/2013 0851   VLDL 49.2* 10/27/2013 0851   LDLCALC 37 10/27/2013 0851   LDLDIRECT 57.7 08/24/2011 0959      ASSESSMENT AND PLAN:  Pre-operative cardiovascular  examination The patient does not have any unstable cardiac conditions. ECG is unchanged. However, he does require extensive surgery and it has been almost 3 years since his last assessment for ischemia. He had no symptoms prior to his bypass surgery in 2002. I have recommended proceeding with stress testing for risk stratification.  -  Arrange Lexiscan Myoview.  Coronary artery disease No angina. However, he requires stress testing for risk stratification with upcoming surgery. This will be arranged as outlined above. I will review with his oncologist and surgeon as to whether or not he can resume aspirin. We can resume his Vytorin after his surgery. He remains on beta blocker. His heart rate could be better and he is only taking metoprolol tartrate once daily. I will increase his metoprolol tartrate to 50 mg twice a  day.  HYPERTENSION, BENIGN Adjust beta blocker as outlined above.  HLD (hyperlipidemia) Will try to resume Vytorin after his surgery.  Aortic stenosis Mild by recent echo.  No symptoms to suggest any worsening.  Plan repeat echo in 03/2015.  Esophagus cancer  FU with his oncologist and surgeon as planned.    Current medicines are reviewed at length with the patient today.  The patient does not have concerns regarding medicines.  The following changes have been made:     Increase metoprolol tartrate to 50 mg twice a day  I will review with his oncologist and cardiothoracic surgeon whether or not aspirin can be resumed  Labs/ tests ordered today include:   Orders Placed This Encounter  Procedures  . Myocardial Perfusion Imaging  . EKG 12-Lead    Disposition:   FU with Dr. Kirk Ruths as planned.    Signed, Versie Starks, MHS 10/11/2014 11:27 AM    [facility] [address], [place], [place]  [address] Phone: [phone]; Fax: [phone]

## 2014-10-11 NOTE — Telephone Encounter (Signed)
Faxed pt medical records to Dr. [phone]

## 2014-10-11 NOTE — Telephone Encounter (Signed)
DR.HARPOLE AT [facility] WOULD LIKE ALL OF DR.SHERRILL'S RECORDS, DEMOGRAPHICS, AND INSURANCE INFORMATION FAXED TO ATTENTION DR.HARPOLE AT [phone]. COULD PT.'S WIFE BE CONTACTED WHEN THE INFORMATION HAS BEEN SENT?

## 2014-10-18 ENCOUNTER — Ambulatory Visit (HOSPITAL_COMMUNITY)
Admission: RE | Admit: 2014-10-18 | Discharge: 2014-10-18 | Disposition: A | Discharge status: Home / Self Care | Payer: BLUE CROSS/BLUE SHIELD | Source: Ambulatory Visit | Attending: Nurse Practitioner | Admitting: Nurse Practitioner

## 2014-10-18 ENCOUNTER — Ambulatory Visit (HOSPITAL_COMMUNITY)
Admission: RE | Admit: 2014-10-18 | Discharge: 2014-10-18 | Disposition: A | Discharge status: Home / Self Care | Payer: BLUE CROSS/BLUE SHIELD | Source: Ambulatory Visit | Attending: Cardiothoracic Surgery | Admitting: Cardiothoracic Surgery

## 2014-10-18 DIAGNOSIS — I350 Nonrheumatic aortic (valve) stenosis: Secondary | ICD-10-CM

## 2014-10-18 DIAGNOSIS — C159 Malignant neoplasm of esophagus, unspecified: Secondary | ICD-10-CM | POA: Insufficient documentation

## 2014-10-18 LAB — PULMONARY FUNCTION TEST
DL/VA % pred: 97 %
DL/VA: 4.54 ml/min/mmHg/L
DLCO unc % pred: 87 %
DLCO unc: 29.62 ml/min/mmHg
FEF 25-75 Post: 5.19 L/sec
FEF 25-75 Pre: 3.61 L/sec
FEF2575-%Change-Post: 43 %
FEF2575-%Pred-Post: 208 %
FEF2575-%Pred-Pre: 144 %
FEV1-%Change-Post: 6 %
FEV1-%Pred-Post: 113 %
FEV1-%Pred-Pre: 106 %
FEV1-Post: 3.76 L
FEV1-Pre: 3.54 L
FEV1FVC-%Change-Post: 7 %
FEV1FVC-%Pred-Pre: 112 %
FEV6-%Change-Post: -1 %
FEV6-%Pred-Post: 99 %
FEV6-%Pred-Pre: 100 %
FEV6-Post: 4.25 L
FEV6-Pre: 4.29 L
FEV6FVC-%Change-Post: 0 %
FEV6FVC-%Pred-Post: 106 %
FEV6FVC-%Pred-Pre: 105 %
FVC-%Change-Post: -1 %
FVC-%Pred-Post: 93 %
FVC-%Pred-Pre: 95 %
FVC-Post: 4.25 L
FVC-Pre: 4.32 L
Post FEV1/FVC ratio: 88 %
Post FEV6/FVC ratio: 100 %
Pre FEV1/FVC ratio: 82 %
Pre FEV6/FVC Ratio: 99 %
RV % pred: 117 %
RV: 2.98 L
TLC % pred: 102 %
TLC: 7.42 L

## 2014-10-18 LAB — GLUCOSE, CAPILLARY: Glucose-Capillary: 110 mg/dL — ABNORMAL HIGH (ref 70–99)

## 2014-10-18 MED ORDER — ALBUTEROL SULFATE (2.5 MG/3ML) 0.083% IN NEBU
2.5000 mg | INHALATION_SOLUTION | Freq: Once | RESPIRATORY_TRACT | Status: AC
  Administered 2014-10-18: 2.5 mg via RESPIRATORY_TRACT

## 2014-10-18 MED ORDER — FLUDEOXYGLUCOSE F - 18 (FDG) INJECTION
11.6600 | Freq: Once | INTRAVENOUS | Status: AC | PRN
  Administered 2014-10-18: 11.66 via INTRAVENOUS

## 2014-10-19 ENCOUNTER — Ambulatory Visit (HOSPITAL_BASED_OUTPATIENT_CLINIC_OR_DEPARTMENT_OTHER): Payer: BLUE CROSS/BLUE SHIELD | Admitting: Oncology

## 2014-10-19 ENCOUNTER — Telehealth: Payer: Self-pay | Admitting: Nurse Practitioner

## 2014-10-19 VITALS — BP 149/63 | HR 76 | Temp 98.1°F | Resp 18 | Ht 71.0 in | Wt 229.7 lb

## 2014-10-19 DIAGNOSIS — C155 Malignant neoplasm of lower third of esophagus: Secondary | ICD-10-CM

## 2014-10-19 DIAGNOSIS — C159 Malignant neoplasm of esophagus, unspecified: Secondary | ICD-10-CM

## 2014-10-19 NOTE — Progress Notes (Signed)
[place] OFFICE PROGRESS NOTE   Diagnosis: Gastroesophageal carcinoma  INTERVAL HISTORY:   He returns as scheduled. He no longer has pain or dysphagia. He is tolerating a soft diet.  Objective:  Vital signs in last 24 hours:  Blood pressure 149/63, pulse 76, temperature 98.1 F (36.7 C), temperature source Oral, resp. rate 18, height 5\' 11"  (1.803 m), weight 229 lb 11.2 oz (104.191 kg), SpO2 100 %.    HEENT: No thrush or ulcers Lymphatics: No cervical, supra-clavicular, axillary, or inguinal nodes. Prominent right axillary fat pad Resp: Lungs clear bilaterally Cardio: Regular rate and rhythm GI: No hepatomegaly, nontender Vascular: No leg edema  Lab Results:  Lab Results  Component Value Date   WBC 3.0* 09/21/2014   HGB 12.3* 09/21/2014   HCT 35.6* 09/21/2014   MCV 86.8 09/21/2014   PLT 130* 09/21/2014   NEUTROABS 2.2 09/21/2014     Imaging:  Nm Pet Image Restag (ps) Skull Base To Thigh  10/18/2014   CLINICAL DATA:  Subsequent treatment strategy for adenocarcinoma of the distal esophagus/GE junction.  EXAM: NUCLEAR MEDICINE PET SKULL BASE TO THIGH  TECHNIQUE: 13.7 mCi F-18 FDG was injected intravenously. Full-ring PET imaging was performed from the skull base to thigh after the radiotracer. CT data was obtained and used for attenuation correction and anatomic localization.  FASTING BLOOD GLUCOSE:  Value: 105 mg/dl  COMPARISON:  08/07/2014.  FINDINGS: NECK  No hypermetabolic lymph nodes in the neck.  CHEST  As before, there is hypermetabolism in the distal esophagus, towards the esophagogastric junction. SUV max = 4.7 today compared 8.5 previously. Small gastrohepatic ligament nodes which are present previously have decreased in size in the interval. Index lymph node nipple on image 113 series 4 measures 7 mm in short axis today compared to 11 mm when I remeasure the same level on the prior study.  ABDOMEN/PELVIS  No abnormal hypermetabolic activity within  the liver, pancreas, adrenal glands, or spleen. No hypermetabolic lymph nodes in the abdomen or pelvis.  Fourteen no air calcified stone noted in the gallbladder. 6.2 cm cyst in the upper pole the right kidney no hypermetabolism. Atherosclerotic calcification is seen in the wall of the abdominal aorta.  SKELETON  No focal hypermetabolic activity to suggest skeletal metastasis.  IMPRESSION: Interval decrease in hypermetabolism associated with the distal esophageal lesion.  The paraesophageal/gastrohepatic ligament lymph nodes seen on the previous study have decreased in size interval. No hypermetabolic FDG uptake in these lymph nodes above background soft tissue levels.  No evidence for interval development distant hypermetabolic metastases.   Electronically Signed   By: Misty Stanley M.D.   On: 10/18/2014 10:38    Medications: I have reviewed the patient's current medications.  Assessment/Plan: 1. 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 No extension beyond the gastroesophageal junction into the proximal stomach. Previously noted borderline enlarged paraesophageal lymph nodes showed low level metabolic activity. Multiple areas of presumably physiologic activity throughout the small bowel and colon.  Initiation of radiation and weekly Taxol/carboplatin 08/10/2014. Last Taxol/carboplatin 09/09/2014. Final radiation treatment 09/21/2014.  Restaging PET scan 10/18/2014 with a decrease in the hypermetabolism associated with the distal esophagus  mass, paraesophageal/gastrohepatic lymph nodes on the initial PET have  decreased in size with out hypermetabolism  2. Solid dysphagia secondary to #1   3. History of coronary artery disease, status post coronary artery bypass surgery in 2002   4. Diabetes   5. Hypertension   6. Hyperlipidemia   7. Aortic stenosis     Disposition:  Mr. Dolph completed concurrent chemotherapy and radiation. There has been clinical and radiologic improvement. The restaging PET scan reveals no evidence of distant metastatic disease. He saw Dr. Servando Snare and is seeking a second opinion at [facility] regarding the indication for an esophagogastrectomy procedure.  I explained the chance of curative therapy is small. He is scheduled to undergo a cardiac evaluation next week.  I asked him to contact me if there are questions after the surgical evaluation. He will return for an office visit here in approximately 6 weeks.  Betsy Coder, MD  10/19/2014  12:37 PM

## 2014-10-19 NOTE — Telephone Encounter (Signed)
Pt confirmed labs/ov per 04/12 POF, gave pt AVS and Calendar.... KJ  °

## 2014-10-21 ENCOUNTER — Other Ambulatory Visit: Payer: BC Managed Care – PPO

## 2014-10-25 ENCOUNTER — Ambulatory Visit: Payer: BLUE CROSS/BLUE SHIELD | Admitting: Cardiothoracic Surgery

## 2014-10-25 ENCOUNTER — Other Ambulatory Visit: Payer: Self-pay | Admitting: Family Medicine

## 2014-10-26 ENCOUNTER — Telehealth: Payer: Self-pay | Admitting: Physician Assistant

## 2014-10-26 MED ORDER — ASPIRIN EC 81 MG PO TBEC: 81.0000 mg | DELAYED_RELEASE_TABLET | Freq: Every day | ORAL | Status: AC

## 2014-10-26 NOTE — Telephone Encounter (Signed)
pt notified per Brynda Rim. PA, Dr. Servando Snare, Dr. Benay Spice ok to resume ASA 81 daily. Pt advised to avoid any extra ASA, NSAIDS, Fish oil, Mortin or Advil. Pt verbalized understanding to plan of care.

## 2014-10-26 NOTE — Addendum Note (Signed)
Addended by: Michae Kava on: 10/26/2014 05:27 PM   Modules accepted: Orders, Medications

## 2014-10-26 NOTE — Telephone Encounter (Signed)
Reviewed with Dr. Servando Snare and Dr. Benay Spice.    It is ok to resume ASA 81 mg QD.  He should avoid extra ASA, advil, motrin or other NSAID and no fish oil.  Richardson Dopp, PA-C   10/26/2014 3:06 PM

## 2014-10-27 ENCOUNTER — Ambulatory Visit (HOSPITAL_COMMUNITY): Payer: BLUE CROSS/BLUE SHIELD | Attending: Physician Assistant | Admitting: Radiology

## 2014-10-27 DIAGNOSIS — I10 Essential (primary) hypertension: Secondary | ICD-10-CM | POA: Diagnosis not present

## 2014-10-27 DIAGNOSIS — E119 Type 2 diabetes mellitus without complications: Secondary | ICD-10-CM | POA: Diagnosis not present

## 2014-10-27 DIAGNOSIS — R5383 Other fatigue: Secondary | ICD-10-CM | POA: Diagnosis not present

## 2014-10-27 DIAGNOSIS — Z01810 Encounter for preprocedural cardiovascular examination: Secondary | ICD-10-CM

## 2014-10-27 DIAGNOSIS — R0609 Other forms of dyspnea: Secondary | ICD-10-CM | POA: Diagnosis present

## 2014-10-27 MED ORDER — TECHNETIUM TC 99M SESTAMIBI GENERIC - CARDIOLITE
33.0000 | Freq: Once | INTRAVENOUS | Status: AC | PRN
  Administered 2014-10-27: 33 via INTRAVENOUS

## 2014-10-27 MED ORDER — TECHNETIUM TC 99M SESTAMIBI GENERIC - CARDIOLITE
11.0000 | Freq: Once | INTRAVENOUS | Status: AC | PRN
  Administered 2014-10-27: 11 via INTRAVENOUS

## 2014-10-27 MED ORDER — REGADENOSON 0.4 MG/5ML IV SOLN
0.4000 mg | Freq: Once | INTRAVENOUS | Status: AC
  Administered 2014-10-27: 0.4 mg via INTRAVENOUS

## 2014-10-27 NOTE — Progress Notes (Signed)
[facility] [address] [place], [place] [address] [phone]    Cardiology Nuclear Med Study  [name] is a 74 y.o. male     MRN : [id number]     DOB: Mar 24, 1941  Procedure Date: 10/27/2014  Nuclear Med Background Indication for Stress Test:  Evaluation for Ischemia, Surgical Clearance- Esophageal Cancer and Follow up CAD History:  CAD, '13 MPI:EF: 56% NL, Mild AS Cardiac Risk Factors: Hypertension and NIDDM  Symptoms:  DOE and Fatigue   Nuclear Pre-Procedure Caffeine/Decaff Intake:  None NPO After: 6pm   Lungs:  clear O2 Sat: 98% on room air. IV 0.9% NS with Angio Cath:  22g  IV Site: R Hand  IV Started by:  Crissie Figures, RN  Chest Size (in):  48 Cup Size: n/a  Height: 5\' 11"  (1.803 m)  Weight:  232 lb (105.235 kg)  BMI:  Body mass index is 32.37 kg/(m^2). Tech Comments:  N/A    Nuclear Med Study 1 or 2 day study: 1 day  Stress Test Type:  Lexiscan  Reading MD: N/A  Order Authorizing Provider:  Kirk Ruths, MD  Resting Radionuclide: Technetium 46m Sestamibi  Resting Radionuclide Dose: 11.0 mCi   Stress Radionuclide:  Technetium 53m Sestamibi  Stress Radionuclide Dose: 33.0 mCi           Stress Protocol Rest HR: 66 Stress HR: 96  Rest BP: 160/70 Stress BP: 142/74  Exercise Time (min): n/a METS: n/a   Predicted Max HR: 149 bpm % Max HR: 64.43 bpm Rate Pressure Product: 15360   Dose of Adenosine (mg):  n/a Dose of Lexiscan: 0.4 mg  Dose of Atropine (mg): n/a Dose of Dobutamine: n/a mcg/kg/min (at max HR)  Stress Test Technologist: Perrin Maltese, EMT-P  Nuclear Technologist:  Earl Many, CNMT     Rest Procedure:  Myocardial perfusion imaging was performed at rest 45 minutes following the intravenous administration of Technetium 27m Sestamibi. Rest ECG: NSR , TWI in the inferior and lateral leads.   Stress Procedure:  The patient received IV Lexiscan 0.4 mg over 15-seconds.  Technetium 53m Sestamibi injected at  30-seconds. This patient was sob and lt. Headed with the Lexiscan injection. Quantitative spect images were obtained after a 45 minute delay. Stress ECG: No significant change from baseline ECG  QPS Raw Data Images:  Normal; no motion artifact; normal heart/lung ratio. Stress Images:  Normal homogeneous uptake in all areas of the myocardium. Rest Images:  Normal homogeneous uptake in all areas of the myocardium. Subtraction (SDS):  No evidence of ischemia. Transient Ischemic Dilatation (Normal <1.22):  0.93 Lung/Heart Ratio (Normal <0.45):  0.24  Quantitative Gated Spect Images QGS EDV:  109 ml QGS ESV:  52 ml  Impression Exercise Capacity:  Lexiscan with no exercise. BP Response:  Normal blood pressure response. Clinical Symptoms:  No significant symptoms noted. ECG Impression:  No significant ST segment change suggestive of ischemia. Comparison with Prior Nuclear Study: No significant change from previous study 02/12/12.    Overall Impression:  Normal stress nuclear study.  LV Ejection Fraction: 52%.  LV Wall Motion:  NL LV Function; NL Wall Motion.   Thayer Headings, Brooke Bonito., MD, [facility] 10/27/2014, 3:55 PM [address]  [place] Pager [phone]

## 2014-10-28 ENCOUNTER — Encounter: Payer: Self-pay | Admitting: Physician Assistant

## 2014-10-28 ENCOUNTER — Ambulatory Visit: Payer: BC Managed Care – PPO | Admitting: Family Medicine

## 2014-10-28 ENCOUNTER — Encounter: Payer: BC Managed Care – PPO | Admitting: Family Medicine

## 2014-10-29 ENCOUNTER — Ambulatory Visit: Payer: BLUE CROSS/BLUE SHIELD | Admitting: Cardiothoracic Surgery

## 2014-11-30 ENCOUNTER — Ambulatory Visit: Payer: BLUE CROSS/BLUE SHIELD | Admitting: Oncology

## 2014-12-01 ENCOUNTER — Telehealth: Payer: Self-pay | Admitting: Oncology

## 2014-12-01 NOTE — Telephone Encounter (Signed)
Pt called to r/s due to pt has an apt at another office.... Confirmed MD visit... KJ

## 2014-12-03 ENCOUNTER — Ambulatory Visit: Payer: BLUE CROSS/BLUE SHIELD | Admitting: Oncology

## 2014-12-13 ENCOUNTER — Ambulatory Visit (INDEPENDENT_AMBULATORY_CARE_PROVIDER_SITE_OTHER): Payer: BLUE CROSS/BLUE SHIELD | Admitting: Family Medicine

## 2014-12-13 ENCOUNTER — Encounter: Payer: Self-pay | Admitting: Family Medicine

## 2014-12-13 VITALS — BP 150/90 | HR 81 | Temp 97.8°F | Wt 230.0 lb

## 2014-12-13 DIAGNOSIS — E039 Hypothyroidism, unspecified: Secondary | ICD-10-CM

## 2014-12-13 DIAGNOSIS — E785 Hyperlipidemia, unspecified: Secondary | ICD-10-CM | POA: Diagnosis not present

## 2014-12-13 DIAGNOSIS — I10 Essential (primary) hypertension: Secondary | ICD-10-CM

## 2014-12-13 DIAGNOSIS — C159 Malignant neoplasm of esophagus, unspecified: Secondary | ICD-10-CM

## 2014-12-13 DIAGNOSIS — E119 Type 2 diabetes mellitus without complications: Secondary | ICD-10-CM

## 2014-12-13 LAB — CBC WITH DIFFERENTIAL/PLATELET
BASOS PCT: 0.5 % (ref 0.0–3.0)
Basophils Absolute: 0 10*3/uL (ref 0.0–0.1)
EOS PCT: 4.4 % (ref 0.0–5.0)
Eosinophils Absolute: 0.3 10*3/uL (ref 0.0–0.7)
HEMATOCRIT: 37.5 % — AB (ref 39.0–52.0)
HEMOGLOBIN: 12.4 g/dL — AB (ref 13.0–17.0)
Lymphocytes Relative: 9.2 % — ABNORMAL LOW (ref 12.0–46.0)
Lymphs Abs: 0.7 10*3/uL (ref 0.7–4.0)
MCHC: 33.1 g/dL (ref 30.0–36.0)
MCV: 90.4 fl (ref 78.0–100.0)
Monocytes Absolute: 0.9 10*3/uL (ref 0.1–1.0)
Monocytes Relative: 12.7 % — ABNORMAL HIGH (ref 3.0–12.0)
NEUTROS ABS: 5.3 10*3/uL (ref 1.4–7.7)
Neutrophils Relative %: 73.2 % (ref 43.0–77.0)
Platelets: 236 10*3/uL (ref 150.0–400.0)
RBC: 4.15 Mil/uL — AB (ref 4.22–5.81)
RDW: 13.6 % (ref 11.5–15.5)
WBC: 7.2 10*3/uL (ref 4.0–10.5)

## 2014-12-13 LAB — BASIC METABOLIC PANEL
BUN: 21 mg/dL (ref 6–23)
CO2: 31 mEq/L (ref 19–32)
CREATININE: 0.9 mg/dL (ref 0.40–1.50)
Calcium: 9.9 mg/dL (ref 8.4–10.5)
Chloride: 102 mEq/L (ref 96–112)
GFR: 88.23 mL/min (ref >60.00)
Glucose, Bld: 114 mg/dL — ABNORMAL HIGH (ref 70–99)
Potassium: 4.2 mEq/L (ref 3.5–5.1)
SODIUM: 137 meq/L (ref 135–145)

## 2014-12-13 LAB — HEPATIC FUNCTION PANEL
ALT: 11 U/L (ref 0–53)
AST: 17 U/L (ref 0–37)
Albumin: 4 g/dL (ref 3.5–5.2)
Alkaline Phosphatase: 72 U/L (ref 39–117)
Bilirubin, Direct: 0.1 mg/dL (ref 0.0–0.3)
Total Bilirubin: 0.6 mg/dL (ref 0.2–1.2)
Total Protein: 7.6 g/dL (ref 6.0–8.3)

## 2014-12-13 LAB — HEMOGLOBIN A1C: HEMOGLOBIN A1C: 5.2 % (ref 4.6–6.5)

## 2014-12-13 LAB — TSH: TSH: 0.17 u[IU]/mL — ABNORMAL LOW (ref 0.35–4.50)

## 2014-12-13 NOTE — Progress Notes (Signed)
Pre visit review using our clinic review tool, if applicable. No additional management support is needed unless otherwise documented below in the visit note. 

## 2014-12-13 NOTE — Progress Notes (Signed)
Subjective:    Patient ID: [name], male    DOB: 1940/09/13, 74 y.o.   MRN: [id number]  HPI  patient here for medical follow-up. He had esophageal cancer diagnosed last fall. He went through radiation therapy and chemotherapy and then had surgery at [facility] on May 9. He was admitted there for 8 days and has done reasonably well. He has feeding tube in place currently 5 hours per day and 800 cal per day and started eating about 3-4 days ago. Wife states that he is consuming about 700 cal on his own at this point and he hopes to progress daily.   He has not been taking any of his regular medications the past couple weeks. Blood pressures been stable by home readings. Blood sugars have also been stable. He was previously taking metformin for that. He did start back his metoprolol and levothyroxine a few days ago. He remains on Protonix 40 mg once daily and still has some heartburn type symptoms. Apparently, most of esophagus was removed. He has to be cautious to sit upright after eating.   No chest pains. He has history of aortic stenosis but no recent progressive dyspnea.  Past Medical History  Diagnosis Date  . HYPOTHYROIDISM 05/08/2010  . DM 05/08/2010  . HYPERLIPIDEMIA 05/08/2010  . HYPERTENSION, BENIGN 04/13/2008  . CAD, NATIVE VESSEL 04/13/2008  . Aortic stenosis   . Gall bladder stones 07/20/2014  . Hx of CABG 07/20/2014    CAD with history of coronary artery bypass graft 2002   . Esophageal cancer   . Radiation 08/10/14-09/21/14    50.4 gray to esophagus  . Hx of cardiovascular stress test     Lexiscan Myoview 4/16:  No ischemia, EF 52%   Past Surgical History  Procedure Laterality Date  . Coronary artery bypass graft  2002  . Eus N/A 07/22/2014    Procedure: UPPER ENDOSCOPIC ULTRASOUND (EUS) LINEAR;  Surgeon: Milus Banister, MD;  Location: [facility];  Service: Endoscopy;  Laterality: N/A;  . Esophagogastroduodenoscopy endoscopy  07/12/14    reports that he quit  smoking about 15 years ago. His smoking use included Cigarettes. He has a 30 pack-year smoking history. He quit smokeless tobacco use about 14 years ago. He reports that he drinks alcohol. He reports that he does not use illicit drugs. family history includes Alcohol abuse in his father; Colon cancer (age of onset: 78) in his father; Coronary artery disease in his brother and mother; Diabetes in his mother; Diabetes type II in his mother; Hypertension in his mother. There is no history of Heart attack or Stroke. No Known Allergies    Review of Systems  Constitutional: Positive for fatigue.  HENT: Positive for trouble swallowing.   Eyes: Negative for visual disturbance.  Respiratory: Negative for cough, chest tightness and shortness of breath.   Cardiovascular: Negative for chest pain, palpitations and leg swelling.  Endocrine: Negative for polydipsia and polyuria.  Genitourinary: Negative for dysuria.  Neurological: Negative for dizziness, syncope, weakness, light-headedness and headaches.       Objective:   Physical Exam  Constitutional: He appears well-developed and well-nourished. No distress.  HENT:  Mouth/Throat: Oropharynx is clear and moist.  Cardiovascular: Normal rate and regular rhythm.   Pulmonary/Chest: Effort normal and breath sounds normal. No respiratory distress. He has no wheezes. He has no rales.  Abdominal: Soft. There is no tenderness.  Neurological: He is alert.  Psychiatric: He has a normal mood and affect. His behavior  is normal.          Assessment & Plan:   #1 esophagus cancer with recent surgery at [facility]. He reportedly had clear margins. He has close follow-up here locally with oncology and will be getting regular follow-up at [facility] as well  #2 hypertension currently stable off medications. Continue to monitor  #3 type 2 diabetes. History of good control. Fasting blood sugar stable recently off metformin. Repeat A1c today  #4  hypothyroidism. Recheck TSH. He was recently off medication briefly

## 2014-12-14 ENCOUNTER — Other Ambulatory Visit: Payer: Self-pay

## 2014-12-14 ENCOUNTER — Other Ambulatory Visit: Payer: Self-pay | Admitting: Family Medicine

## 2014-12-14 DIAGNOSIS — E039 Hypothyroidism, unspecified: Secondary | ICD-10-CM

## 2014-12-14 MED ORDER — LEVOTHYROXINE SODIUM 175 MCG PO TABS: 175.0000 ug | ORAL_TABLET | Freq: Every day | ORAL | Status: DC

## 2014-12-21 ENCOUNTER — Telehealth: Payer: Self-pay | Admitting: Oncology

## 2014-12-21 ENCOUNTER — Ambulatory Visit (HOSPITAL_BASED_OUTPATIENT_CLINIC_OR_DEPARTMENT_OTHER): Payer: BLUE CROSS/BLUE SHIELD | Admitting: Oncology

## 2014-12-21 VITALS — BP 148/55 | HR 82 | Temp 97.6°F | Resp 18 | Ht 71.0 in | Wt 227.4 lb

## 2014-12-21 DIAGNOSIS — C155 Malignant neoplasm of lower third of esophagus: Secondary | ICD-10-CM | POA: Diagnosis not present

## 2014-12-21 DIAGNOSIS — I2510 Atherosclerotic heart disease of native coronary artery without angina pectoris: Secondary | ICD-10-CM | POA: Diagnosis not present

## 2014-12-21 DIAGNOSIS — I10 Essential (primary) hypertension: Secondary | ICD-10-CM

## 2014-12-21 DIAGNOSIS — R1310 Dysphagia, unspecified: Secondary | ICD-10-CM

## 2014-12-21 DIAGNOSIS — E119 Type 2 diabetes mellitus without complications: Secondary | ICD-10-CM

## 2014-12-21 DIAGNOSIS — C159 Malignant neoplasm of esophagus, unspecified: Secondary | ICD-10-CM

## 2014-12-21 NOTE — Progress Notes (Signed)
[place] OFFICE PROGRESS NOTE   Diagnosis: Esophagus cancer  INTERVAL HISTORY:   Mr. Welty underwent an esophagogastrectomy and placement of a jejunostomy feeding tube at [facility] on 11/15/2014. He reports an uneventful operative recovery. He has a productive cough. No fever or dyspnea. He discontinued tube feedings on 12/18/2014. He is eating frequent small meals. He complains of early satiety and frequent belching. Soreness at the right chest wall, no pain.  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 Tumor extended through the muscular propria into the peri-esophageal soft tissue. The resection margins were negative with a less than 1 mm circumferential margin. 6 of 22 lymph nodes contained metastatic carcinoma, 3 additional lymph nodes were noted a have fibrosis suspicious for previous involvement by carcinoma. He is scheduled to see Dr. Elenor Quinones next week.  Objective:  Vital signs in last 24 hours:  Blood pressure 148/55, pulse 82, temperature 97.6 F (36.4 C), temperature source Oral, resp. rate 18, height 5\' 11"  (1.803 m), weight 227 lb 6.4 oz (103.148 kg), SpO2 100 %.    HEENT: Neck without mass Lymphatics: No cervical, supraclavicular, or axillary nodes Resp: End inspiratory coarse rhonchi at the posterior bases bilaterally, no respiratory distress Cardio: Regular rate and rhythm GI: No hepatosplenomegaly, nontender Vascular: No leg edema  Skin: Healed surgical incisions     Lab Results:  Lab Results  Component Value Date   WBC 7.2 12/13/2014   HGB 12.4* 12/13/2014   HCT 37.5* 12/13/2014   MCV 90.4 12/13/2014   PLT 236.0 12/13/2014   NEUTROABS 5.3 12/13/2014     Medications: I have reviewed the patient's current medications.  Assessment/Plan: 1. Adenocarcinoma of the distal esophagus/GE junction, status post an endoscopic biopsy 07/12/2014   EUS 07/22/2014 confirmed a uT3,uN1 tumor    Staging CTs of the chest, abdomen, and pelvis on 07/13/2014 revealed a distal esophageal mass, prominent posterior mediastinal lymph nodes, and a 1 cm gastrohepatic ligament node  Staging PET scan 08/09/2014 with distal esophageal mass similar to the prior examination and hypermetabolic. No extension beyond the gastroesophageal junction into the proximal stomach. Previously noted borderline enlarged paraesophageal lymph nodes showed low level metabolic activity. Multiple areas of presumably physiologic activity throughout the small bowel and colon.  Initiation of radiation and weekly Taxol/carboplatin 08/10/2014. Last Taxol/carboplatin 09/09/2014. Final radiation treatment 09/21/2014.  Restaging PET scan 10/18/2014 with a decrease in the hypermetabolism associated with the distal esophagus mass, paraesophageal/gastrohepatic lymph nodes on the initial PET have decreased in size with out hypermetabolism  Esophagogastrectomy at [facility] on 11/15/2014 confirmed a ypT3, ypN2 invasive adenocarcinoma, 6 of 22 lymph nodes positive for metastatic carcinoma with 3 additional lymph nodes showing fibrosis suspicious for previous involvement by carcinoma, negative resection margins-less than 1 mm circumferential margin, moderate treatment effect  2. History of Solid dysphagia secondary to #1   3. History of coronary artery disease, status post coronary artery bypass surgery in 2002   4. Diabetes   5. Hypertension   6. Hyperlipidemia   7. Aortic stenosis    Disposition:  [name] is recovering from the esophagogastrectomy procedure. He appears to be doing well. He will see Dr. Elenor Quinones next week. I reviewed the details of the surgical pathology report and discussed the prognosis with DarrenGalyon and his wife. He understands there is a chance he has undergone curative therapy, but this is small. He is at high risk of developing recurrent esophagus cancer over the next few years. There is no  "standard" role  for adjuvant chemotherapy in this setting. I recommend observation. He will follow-up with Dr. Elenor Quinones next week to get his opinion regarding adjuvant therapy and surveillance CT scans. Mr. Cerasoli will return for an office visit in 6 weeks. He will contact us for a progressive cough or dyspnea.  Betsy Coder, MD  12/21/2014  9:50 AM

## 2014-12-21 NOTE — Telephone Encounter (Signed)
Gave patient wife avs report and appointments for July.

## 2014-12-31 ENCOUNTER — Telehealth: Payer: Self-pay | Admitting: Oncology

## 2014-12-31 NOTE — Telephone Encounter (Signed)
Returned call to patient wife re cancelling 7/26 appointment. Spoke with wife confirming cancellation. Per wife they will call back to reschedule at a later date.

## 2015-02-01 ENCOUNTER — Ambulatory Visit: Payer: BLUE CROSS/BLUE SHIELD | Admitting: Oncology

## 2015-02-09 ENCOUNTER — Encounter: Payer: Self-pay | Admitting: Family Medicine

## 2015-02-09 ENCOUNTER — Other Ambulatory Visit: Payer: Self-pay

## 2015-02-09 MED ORDER — METOPROLOL TARTRATE 50 MG PO TABS: 50.0000 mg | ORAL_TABLET | Freq: Two times a day (BID) | ORAL | Status: DC

## 2015-03-01 NOTE — Progress Notes (Signed)
HPI: FU CAD; hx of CAD status post CABG 4 in 2002, HTN, HL, aortic stenosis. Since last seen, he denies dyspnea, chest pain, palpitations or syncope. He has undergone surgery, chemotherapy and radiation for esophageal cancer.  Studies/Reports Reviewed Today:  Echo 9/15 - Left ventricle: The cavity size was normal. Systolic function was normal. The estimated ejection fraction was in the range of 55% to 60%. Wall motion was normal; there were no regional wall motion abnormalities. - Aortic valve: There was mild stenosis. Valve area (VTI): 2.47 cm^2. Valve area (Vmax): 2.49 cm^2. Valve area (Vmean): 2.35 cm^2. Mean 13 mmHg - Left atrium: The atrium was mildly dilated. - Atrial septum: No defect or patent foramen ovale was identified.  Myoview 4/16 LV Ejection Fraction: 52%. LV Wall Motion: NL LV Function; NL Wall Motion  Current Outpatient Prescriptions  Medication Sig Dispense Refill  . aspirin EC 81 MG tablet Take 1 tablet (81 mg total) by mouth daily.    Marland Kitchen levothyroxine (SYNTHROID, LEVOTHROID) 175 MCG tablet Take 1 tablet (175 mcg total) by mouth daily before breakfast. 30 tablet 5  . metoprolol (LOPRESSOR) 50 MG tablet Take 1 tablet (50 mg total) by mouth 2 (two) times daily. 180 tablet 1  . Multiple Vitamin (MULTIVITAMIN) tablet Take 1 tablet by mouth daily.    Glory Rosebush VERIO test strip     . polyethylene glycol (MIRALAX / GLYCOLAX) packet Take 17 g by mouth daily as needed.     No current facility-administered medications for this visit.     Past Medical History  Diagnosis Date  . HYPOTHYROIDISM 05/08/2010  . DM 05/08/2010  . HYPERLIPIDEMIA 05/08/2010  . HYPERTENSION, BENIGN 04/13/2008  . CAD, NATIVE VESSEL 04/13/2008  . Aortic stenosis   . Gall bladder stones 07/20/2014  . Hx of CABG 07/20/2014    CAD with history of coronary artery bypass graft 2002   . Esophageal cancer   . Radiation 08/10/14-09/21/14    50.4 gray to esophagus  . Hx of cardiovascular stress test      Lexiscan Myoview 4/16:  No ischemia, EF 52%    Past Surgical History  Procedure Laterality Date  . Coronary artery bypass graft  2002  . Eus N/A 07/22/2014    Procedure: UPPER ENDOSCOPIC ULTRASOUND (EUS) LINEAR;  Surgeon: Milus Banister, MD;  Location: [facility] ENDOSCOPY;  Service: Endoscopy;  Laterality: N/A;  . Esophagogastroduodenoscopy endoscopy  07/12/14    Social History   Social History  . Marital Status: Married    Spouse Name: N/A  . Number of Children: 2  . Years of Education: N/A   Occupational History  . Retired English as a second language teacher    Social History Main Topics  . Smoking status: Former Smoker -- 1.00 packs/day for 30 years    Types: Cigarettes    Quit date: 07/30/1999  . Smokeless tobacco: Former Systems developer    Quit date: 07/18/2000  . Alcohol Use: 0.0 oz/week    0 Standard drinks or equivalent per week     Comment: rare - used to drink daily before 2001  . Drug Use: No  . Sexual Activity: Not on file     Comment: regular exercise    Other Topics Concern  . Not on file   Social History Narrative   Married, wife [name]   #2 grown sons, Shanon Brow and Annie Main   Retired English as a second language teacher   Lives in [place] being outdoors    ROS: no fevers or chills, productive cough, hemoptysis, dysphasia, odynophagia,  melena, hematochezia, dysuria, hematuria, rash, seizure activity, orthopnea, PND, pedal edema, claudication. Remaining systems are negative.  Physical Exam: Well-developed well-nourished in no acute distress.  Skin is warm and dry.  HEENT is normal.  Neck is supple.  Chest is clear to auscultation with normal expansion.  Cardiovascular exam is regular rate and rhythm. 2/6 systolic murmur Abdominal exam nontender or distended. No masses palpated. Extremities show no edema. neuro grossly intact

## 2015-03-04 ENCOUNTER — Ambulatory Visit (INDEPENDENT_AMBULATORY_CARE_PROVIDER_SITE_OTHER): Payer: BLUE CROSS/BLUE SHIELD | Admitting: Cardiology

## 2015-03-04 ENCOUNTER — Encounter: Payer: Self-pay | Admitting: Cardiology

## 2015-03-04 ENCOUNTER — Encounter: Payer: Self-pay | Admitting: *Deleted

## 2015-03-04 VITALS — BP 138/76 | HR 60 | Ht 72.0 in | Wt 223.7 lb

## 2015-03-04 DIAGNOSIS — I359 Nonrheumatic aortic valve disorder, unspecified: Secondary | ICD-10-CM | POA: Diagnosis not present

## 2015-03-04 DIAGNOSIS — E785 Hyperlipidemia, unspecified: Secondary | ICD-10-CM | POA: Diagnosis not present

## 2015-03-04 DIAGNOSIS — I350 Nonrheumatic aortic (valve) stenosis: Secondary | ICD-10-CM | POA: Diagnosis not present

## 2015-03-04 MED ORDER — ATORVASTATIN CALCIUM 80 MG PO TABS: 80.0000 mg | ORAL_TABLET | Freq: Every day | ORAL | Status: DC

## 2015-03-04 NOTE — Assessment & Plan Note (Signed)
Repeat echocardiogram. 

## 2015-03-04 NOTE — Patient Instructions (Signed)
Your physician wants you to follow-up in: [place] will receive a reminder letter in the mail two months in advance. If you don't receive a letter, please call our office to schedule the follow-up appointment.   Your physician has requested that you have an echocardiogram. Echocardiography is a painless test that uses sound waves to create images of your heart. It provides your doctor with information about the size and shape of your heart and how well your heart's chambers and valves are working. This procedure takes approximately one hour. There are no restrictions for this procedure.SCHEDULE IN September  START ATORVASTATIN 80 MG ONCE DAILY  Your physician recommends that you return for lab work in: [place]

## 2015-03-04 NOTE — Assessment & Plan Note (Signed)
Continue aspirin. Resume statin.

## 2015-03-04 NOTE — Assessment & Plan Note (Signed)
Blood pressure controlled. Continue present medications. 

## 2015-03-04 NOTE — Assessment & Plan Note (Signed)
Given document coronary disease plan to resume statin. Add Lipitor 40 mg daily. Check lipids and liver in 4 weeks.

## 2015-03-21 ENCOUNTER — Ambulatory Visit (INDEPENDENT_AMBULATORY_CARE_PROVIDER_SITE_OTHER): Payer: BLUE CROSS/BLUE SHIELD | Admitting: Family Medicine

## 2015-03-21 ENCOUNTER — Encounter: Payer: Self-pay | Admitting: Family Medicine

## 2015-03-21 VITALS — BP 126/70 | HR 60 | Temp 97.9°F | Wt 225.0 lb

## 2015-03-21 DIAGNOSIS — E119 Type 2 diabetes mellitus without complications: Secondary | ICD-10-CM

## 2015-03-21 DIAGNOSIS — E785 Hyperlipidemia, unspecified: Secondary | ICD-10-CM

## 2015-03-21 DIAGNOSIS — E038 Other specified hypothyroidism: Secondary | ICD-10-CM | POA: Diagnosis not present

## 2015-03-21 DIAGNOSIS — Z23 Encounter for immunization: Secondary | ICD-10-CM | POA: Diagnosis not present

## 2015-03-21 NOTE — Progress Notes (Signed)
Pre visit review using our clinic review tool, if applicable. No additional management support is needed unless otherwise documented below in the visit note. 

## 2015-03-21 NOTE — Progress Notes (Signed)
Subjective:    Patient ID: [name], male    DOB: August 06, 1940, 74 y.o.   MRN: [id number]  HPI  patient seen for medical follow-up. Esophageal cancer with treatment during the past year at [facility] with chemo and radiation and subsequent removal of all but 20% of his esophagus. He has done extremely well since then. He reports that he had three-month CT chest abdomen pelvis back in August which showed no signs of recurrence. Since his surgery he has had frequent dry cough. His appetite is good but has had some mild weight loss. He still has reflux symptoms but he had vagotomy so he has no acidity issues.   Diabetes improved dramatically after surgery with A1c 5.2%. Recent TSH was low and we adjusted his thyroid medication. He was recently placed by cardiology Lipitor 80 mg daily and recommended follow-up lipid panel in 1 month. We've decided to get is follow-up TSH then as well. No myalgias. Flu vaccine needed and also needs Prevnar 13  Past Medical History  Diagnosis Date  . HYPOTHYROIDISM 05/08/2010  . DM 05/08/2010  . HYPERLIPIDEMIA 05/08/2010  . HYPERTENSION, BENIGN 04/13/2008  . CAD, NATIVE VESSEL 04/13/2008  . Aortic stenosis   . Gall bladder stones 07/20/2014  . Hx of CABG 07/20/2014    CAD with history of coronary artery bypass graft 2002   . Esophageal cancer   . Radiation 08/10/14-09/21/14    50.4 gray to esophagus  . Hx of cardiovascular stress test     Lexiscan Myoview 4/16:  No ischemia, EF 52%   Past Surgical History  Procedure Laterality Date  . Coronary artery bypass graft  2002  . Eus N/A 07/22/2014    Procedure: UPPER ENDOSCOPIC ULTRASOUND (EUS) LINEAR;  Surgeon: Milus Banister, MD;  Location: [facility];  Service: Endoscopy;  Laterality: N/A;  . Esophagogastroduodenoscopy endoscopy  07/12/14    reports that he quit smoking about 15 years ago. His smoking use included Cigarettes. He has a 30 pack-year smoking history. He quit smokeless tobacco use about 14  years ago. He reports that he drinks alcohol. He reports that he does not use illicit drugs. family history includes Alcohol abuse in his father; Colon cancer (age of onset: 7) in his father; Coronary artery disease in his brother and mother; Diabetes in his mother; Diabetes type II in his mother; Hypertension in his mother. There is no history of Heart attack or Stroke. No Known Allergies    Review of Systems  Constitutional: Negative for fatigue.  HENT: Negative for trouble swallowing.   Eyes: Negative for visual disturbance.  Respiratory: Negative for cough, chest tightness and shortness of breath.   Cardiovascular: Negative for chest pain, palpitations and leg swelling.  Neurological: Negative for dizziness, syncope, weakness, light-headedness and headaches.       Objective:   Physical Exam  Constitutional: He appears well-developed and well-nourished. No distress.  Neck: Neck supple.  Cardiovascular: Normal rate and regular rhythm.   Pulmonary/Chest: Effort normal and breath sounds normal. No respiratory distress. He has no wheezes. He has no rales.  Abdominal: Soft. Bowel sounds are normal. He exhibits no distension. There is no tenderness. There is no rebound and no guarding.  Musculoskeletal: He exhibits no edema.          Assessment & Plan:  #1 history of type 2 diabetes. Dramatically improved following his recent weight loss and surgery. Recheck A1c with upcoming labs #2 hyperlipidemia. Recently started on Lipitor. Tolerating well without side  effects. Will follow-up in 2 weeks for lipid and hepatic panel #3 hypothyroidism. Recently over replaced. We adjusted his thyroid medication and he will follow-up for TSH in 2 weeks when he gets his lipid panel #4 health maintenance. Prevnar 13 given. Flu vaccine given.

## 2015-03-21 NOTE — Addendum Note (Signed)
Addended by: Marcina Millard on: 03/21/2015 10:33 AM   Modules accepted: Orders

## 2015-03-29 ENCOUNTER — Ambulatory Visit (HOSPITAL_COMMUNITY): Payer: BLUE CROSS/BLUE SHIELD | Attending: Cardiovascular Disease

## 2015-03-29 ENCOUNTER — Other Ambulatory Visit: Payer: Self-pay

## 2015-03-29 DIAGNOSIS — I359 Nonrheumatic aortic valve disorder, unspecified: Secondary | ICD-10-CM | POA: Diagnosis present

## 2015-03-29 DIAGNOSIS — I351 Nonrheumatic aortic (valve) insufficiency: Secondary | ICD-10-CM | POA: Diagnosis not present

## 2015-03-29 DIAGNOSIS — I517 Cardiomegaly: Secondary | ICD-10-CM | POA: Diagnosis not present

## 2015-03-29 DIAGNOSIS — I358 Other nonrheumatic aortic valve disorders: Secondary | ICD-10-CM | POA: Diagnosis not present

## 2015-04-06 ENCOUNTER — Other Ambulatory Visit (INDEPENDENT_AMBULATORY_CARE_PROVIDER_SITE_OTHER): Payer: BLUE CROSS/BLUE SHIELD

## 2015-04-06 DIAGNOSIS — E039 Hypothyroidism, unspecified: Secondary | ICD-10-CM | POA: Diagnosis not present

## 2015-04-06 DIAGNOSIS — E119 Type 2 diabetes mellitus without complications: Secondary | ICD-10-CM

## 2015-04-06 DIAGNOSIS — E785 Hyperlipidemia, unspecified: Secondary | ICD-10-CM

## 2015-04-06 LAB — LIPID PANEL
CHOLESTEROL: 112 mg/dL (ref 0–200)
HDL: 30.6 mg/dL — AB (ref >39.00)
LDL Cholesterol: 60 mg/dL (ref 0–99)
NONHDL: 81.14
Total CHOL/HDL Ratio: 4
Triglycerides: 104 mg/dL (ref 0.0–149.0)
VLDL: 20.8 mg/dL (ref 0.0–40.0)

## 2015-04-06 LAB — HEPATIC FUNCTION PANEL
ALBUMIN: 3.7 g/dL (ref 3.5–5.2)
ALK PHOS: 69 U/L (ref 39–117)
ALT: 13 U/L (ref 0–53)
AST: 21 U/L (ref 0–37)
Bilirubin, Direct: 0.1 mg/dL (ref 0.0–0.3)
Total Bilirubin: 0.7 mg/dL (ref 0.2–1.2)
Total Protein: 6.6 g/dL (ref 6.0–8.3)

## 2015-04-06 LAB — TSH: TSH: 0.48 u[IU]/mL (ref 0.35–4.50)

## 2015-04-06 LAB — HEMOGLOBIN A1C: HEMOGLOBIN A1C: 5.7 % (ref 4.6–6.5)

## 2015-04-12 ENCOUNTER — Encounter: Payer: Self-pay | Admitting: Gastroenterology

## 2015-05-28 ENCOUNTER — Other Ambulatory Visit: Payer: Self-pay | Admitting: Family Medicine

## 2015-06-20 ENCOUNTER — Encounter: Payer: Self-pay | Admitting: Family Medicine

## 2015-06-20 ENCOUNTER — Ambulatory Visit (INDEPENDENT_AMBULATORY_CARE_PROVIDER_SITE_OTHER): Payer: BLUE CROSS/BLUE SHIELD | Admitting: Family Medicine

## 2015-06-20 VITALS — BP 130/80 | HR 72 | Temp 97.4°F | Resp 14 | Ht 72.0 in | Wt 230.5 lb

## 2015-06-20 DIAGNOSIS — E119 Type 2 diabetes mellitus without complications: Secondary | ICD-10-CM | POA: Diagnosis not present

## 2015-06-20 LAB — MICROALBUMIN / CREATININE URINE RATIO
CREATININE, U: 92.8 mg/dL
MICROALB UR: 3.6 mg/dL — AB (ref 0.0–1.9)
MICROALB/CREAT RATIO: 3.9 mg/g (ref 0.0–30.0)

## 2015-06-20 LAB — HEMOGLOBIN A1C: HEMOGLOBIN A1C: 6 % (ref 4.6–6.5)

## 2015-06-20 NOTE — Progress Notes (Signed)
Pre visit review using our clinic review tool, if applicable. No additional management support is needed unless otherwise documented below in the visit note. 

## 2015-06-20 NOTE — Progress Notes (Signed)
   Subjective:    Patient ID: [name], male    DOB: 1940-11-06, 74 y.o.   MRN: [id number]  HPI Follow-up type 2 diabetes Esophageal cancer followed at [facility]. He had recent CT scan and follow-up which showed no signs of recurrence. He will have repeat again in May. Appetite is been good and he has gained 5 pounds since last visit. Blood sugars remained stable. Fasting blood sugars mostly around 100.  Last A1c less than 6  Medications reviewed. No recent changes. History of aortic stenosis. No chest pain or dyspnea with exertion No recent dizziness  Past Medical History  Diagnosis Date  . HYPOTHYROIDISM 05/08/2010  . DM 05/08/2010  . HYPERLIPIDEMIA 05/08/2010  . HYPERTENSION, BENIGN 04/13/2008  . CAD, NATIVE VESSEL 04/13/2008  . Aortic stenosis   . Gall bladder stones 07/20/2014  . Hx of CABG 07/20/2014    CAD with history of coronary artery bypass graft 2002   . Esophageal cancer ([place])   . Radiation 08/10/14-09/21/14    50.4 gray to esophagus  . Hx of cardiovascular stress test     Lexiscan Myoview 4/16:  No ischemia, EF 52%   Past Surgical History  Procedure Laterality Date  . Coronary artery bypass graft  2002  . Eus N/A 07/22/2014    Procedure: UPPER ENDOSCOPIC ULTRASOUND (EUS) LINEAR;  Surgeon: Milus Banister, MD;  Location: [facility];  Service: Endoscopy;  Laterality: N/A;  . Esophagogastroduodenoscopy endoscopy  07/12/14    reports that he quit smoking about 15 years ago. His smoking use included Cigarettes. He has a 30 pack-year smoking history. He quit smokeless tobacco use about 14 years ago. He reports that he drinks alcohol. He reports that he does not use illicit drugs. family history includes Alcohol abuse in his father; Colon cancer (age of onset: 75) in his father; Coronary artery disease in his brother and mother; Diabetes in his mother; Diabetes type II in his mother; Hypertension in his mother. There is no history of Heart attack or Stroke. No  Known Allergies    Review of Systems  Constitutional: Negative for fatigue.  Eyes: Negative for visual disturbance.  Respiratory: Negative for cough, chest tightness and shortness of breath.   Cardiovascular: Negative for chest pain, palpitations and leg swelling.  Neurological: Negative for dizziness, syncope, weakness, light-headedness and headaches.       Objective:   Physical Exam  Constitutional: He is oriented to person, place, and time. He appears well-developed and well-nourished.  HENT:  Right Ear: External ear normal.  Left Ear: External ear normal.  Mouth/Throat: Oropharynx is clear and moist.  Eyes: Pupils are equal, round, and reactive to light.  Neck: Neck supple. No thyromegaly present.  Cardiovascular: Normal rate and regular rhythm.   Pulmonary/Chest: Effort normal and breath sounds normal. No respiratory distress. He has no wheezes. He has no rales.  Musculoskeletal: He exhibits no edema.  Neurological: He is alert and oriented to person, place, and time.          Assessment & Plan:  Type 2 diabetes. Improved control with recent weight loss. Recheck A1c. If stable, decrease interval of A1c every 6 months. Flu vaccine already given

## 2015-07-26 ENCOUNTER — Encounter: Payer: Self-pay | Admitting: Family Medicine

## 2015-07-26 ENCOUNTER — Other Ambulatory Visit: Payer: Self-pay | Admitting: Family Medicine

## 2015-07-27 ENCOUNTER — Other Ambulatory Visit: Payer: Self-pay | Admitting: Family Medicine

## 2015-07-27 DIAGNOSIS — E785 Hyperlipidemia, unspecified: Secondary | ICD-10-CM

## 2015-07-27 MED ORDER — METOPROLOL TARTRATE 50 MG PO TABS: 50.0000 mg | ORAL_TABLET | Freq: Two times a day (BID) | ORAL | Status: DC

## 2015-07-27 MED ORDER — ATORVASTATIN CALCIUM 80 MG PO TABS: 80.0000 mg | ORAL_TABLET | Freq: Every day | ORAL | Status: DC

## 2015-07-27 MED ORDER — LEVOTHYROXINE SODIUM 175 MCG PO TABS
175.0000 ug | ORAL_TABLET | Freq: Every day | ORAL | Status: DC
Start: 2015-07-27 — End: 2016-01-26

## 2015-11-03 DIAGNOSIS — D3131 Benign neoplasm of right choroid: Secondary | ICD-10-CM | POA: Diagnosis not present

## 2015-11-03 DIAGNOSIS — H35372 Puckering of macula, left eye: Secondary | ICD-10-CM | POA: Diagnosis not present

## 2015-11-03 DIAGNOSIS — H33322 Round hole, left eye: Secondary | ICD-10-CM | POA: Diagnosis not present

## 2015-11-03 DIAGNOSIS — H31009 Unspecified chorioretinal scars, unspecified eye: Secondary | ICD-10-CM | POA: Diagnosis not present

## 2015-11-22 DIAGNOSIS — K219 Gastro-esophageal reflux disease without esophagitis: Secondary | ICD-10-CM | POA: Diagnosis not present

## 2015-11-22 DIAGNOSIS — F458 Other somatoform disorders: Secondary | ICD-10-CM | POA: Diagnosis not present

## 2015-11-22 DIAGNOSIS — C159 Malignant neoplasm of esophagus, unspecified: Secondary | ICD-10-CM | POA: Diagnosis not present

## 2015-11-22 DIAGNOSIS — Z8501 Personal history of malignant neoplasm of esophagus: Secondary | ICD-10-CM | POA: Diagnosis not present

## 2015-11-22 DIAGNOSIS — Z9221 Personal history of antineoplastic chemotherapy: Secondary | ICD-10-CM | POA: Diagnosis not present

## 2015-11-22 DIAGNOSIS — R1310 Dysphagia, unspecified: Secondary | ICD-10-CM | POA: Diagnosis not present

## 2015-11-22 DIAGNOSIS — Z79899 Other long term (current) drug therapy: Secondary | ICD-10-CM | POA: Diagnosis not present

## 2015-11-22 DIAGNOSIS — Z923 Personal history of irradiation: Secondary | ICD-10-CM | POA: Diagnosis not present

## 2015-11-22 DIAGNOSIS — Z08 Encounter for follow-up examination after completed treatment for malignant neoplasm: Secondary | ICD-10-CM | POA: Diagnosis not present

## 2015-12-01 DIAGNOSIS — H33312 Horseshoe tear of retina without detachment, left eye: Secondary | ICD-10-CM | POA: Diagnosis not present

## 2015-12-01 DIAGNOSIS — Z961 Presence of intraocular lens: Secondary | ICD-10-CM | POA: Diagnosis not present

## 2015-12-19 ENCOUNTER — Encounter: Payer: Self-pay | Admitting: Family Medicine

## 2015-12-19 ENCOUNTER — Ambulatory Visit (INDEPENDENT_AMBULATORY_CARE_PROVIDER_SITE_OTHER): Payer: Medicare Other | Admitting: Family Medicine

## 2015-12-19 VITALS — BP 130/70 | HR 59 | Temp 98.2°F | Ht 72.0 in | Wt 233.0 lb

## 2015-12-19 DIAGNOSIS — E119 Type 2 diabetes mellitus without complications: Secondary | ICD-10-CM

## 2015-12-19 DIAGNOSIS — I10 Essential (primary) hypertension: Secondary | ICD-10-CM | POA: Diagnosis not present

## 2015-12-19 DIAGNOSIS — E785 Hyperlipidemia, unspecified: Secondary | ICD-10-CM | POA: Diagnosis not present

## 2015-12-19 DIAGNOSIS — C159 Malignant neoplasm of esophagus, unspecified: Secondary | ICD-10-CM

## 2015-12-19 LAB — POCT GLYCOSYLATED HEMOGLOBIN (HGB A1C): HEMOGLOBIN A1C: 5.6

## 2015-12-19 NOTE — Progress Notes (Signed)
Subjective:    Patient ID: [name], male    DOB: [birth year], 75 y.o.   MRN: [id number]  HPI Patient seen for follow-up type 2 diabetes. Last A1c 6.0%. Does not monitor blood sugars regularly. Currently followed with diet and lifestyle. Denies any polyuria or polydipsia.  Esophageal cancer with surgery one year ago. He's done very well. He had follow-up scans in May which showed no evidence for recurrence. He is very limited in how he eats because of his surgery (eats slowly and small amounts secondary to slow transit). He states that 80% of esophagus was removed. He does have good appetite and his had gradual weight gain ever since his surgery.  Other medical problems include history of obesity, hyperlipidemia, CAD, hypertension, hypothyroidism. TSH was normal last September. Medications reviewed. Compliant with all.  Past Medical History  Diagnosis Date  . HYPOTHYROIDISM 05/08/2010  . DM 05/08/2010  . HYPERLIPIDEMIA 05/08/2010  . HYPERTENSION, BENIGN 04/13/2008  . CAD, NATIVE VESSEL 04/13/2008  . Aortic stenosis   . Gall bladder stones 07/20/2014  . Hx of CABG 07/20/2014    CAD with history of coronary artery bypass graft 2002   . Esophageal cancer ([place])   . Radiation 08/10/14-09/21/14    50.4 gray to esophagus  . Hx of cardiovascular stress test     Lexiscan Myoview 4/16:  No ischemia, EF 52%   Past Surgical History  Procedure Laterality Date  . Coronary artery bypass graft  2002  . Eus N/A 07/22/2014    Procedure: UPPER ENDOSCOPIC ULTRASOUND (EUS) LINEAR;  Surgeon: Milus Banister, MD;  Location: [facility];  Service: Endoscopy;  Laterality: N/A;  . Esophagogastroduodenoscopy endoscopy  07/12/14    reports that he quit smoking about 16 years ago. His smoking use included Cigarettes. He has a 30 pack-year smoking history. He quit smokeless tobacco use about 15 years ago. He reports that he drinks alcohol. He reports that he does not use illicit drugs. family history  includes Alcohol abuse in his father; Colon cancer (age of onset: 55) in his father; Coronary artery disease in his brother and mother; Diabetes in his mother; Diabetes type II in his mother; Hypertension in his mother. There is no history of Heart attack or Stroke. No Known Allergies     Review of Systems  Constitutional: Negative for appetite change, fatigue and unexpected weight change.  Eyes: Negative for visual disturbance.  Respiratory: Negative for cough, chest tightness and shortness of breath.   Cardiovascular: Negative for chest pain, palpitations and leg swelling.  Gastrointestinal: Negative for nausea, vomiting and abdominal pain.  Endocrine: Negative for polydipsia and polyuria.  Neurological: Negative for dizziness, syncope, weakness, light-headedness and headaches.       Objective:   Physical Exam  Constitutional: He is oriented to person, place, and time. He appears well-developed and well-nourished.  Neck: Neck supple.  Cardiovascular: Normal rate and regular rhythm.  Exam reveals no gallop.   Pulmonary/Chest: Effort normal and breath sounds normal. No respiratory distress. He has no wheezes. He has no rales.  Musculoskeletal: He exhibits no edema.  Lymphadenopathy:    He has no cervical adenopathy.  Neurological: He is alert and oriented to person, place, and time.  Skin: No rash noted.  Psychiatric: He has a normal mood and affect. His behavior is normal.          Assessment & Plan:  #1 type 2 diabetes. History of good control. Recheck A1c.  A1C 5.6%.   Repeat in  6 months.  #2 esophageal cancer. Recent scans in May showed no signs of recurrence. He will continue close follow-up at [facility] oncology  #3 hypothyroidism. Continue levothyroxine. Recheck TSH at follow-up  #4 dyslipidemia. Continue atorvastatin. Denies side effects. Recheck lipids at follow-up  Eulas Post MD [facility]

## 2015-12-19 NOTE — Progress Notes (Signed)
Pre visit review using our clinic review tool, if applicable. No additional management support is needed unless otherwise documented below in the visit note. 

## 2016-01-16 DIAGNOSIS — R0981 Nasal congestion: Secondary | ICD-10-CM | POA: Diagnosis not present

## 2016-01-16 DIAGNOSIS — T7840XA Allergy, unspecified, initial encounter: Secondary | ICD-10-CM | POA: Diagnosis not present

## 2016-01-16 DIAGNOSIS — J342 Deviated nasal septum: Secondary | ICD-10-CM | POA: Diagnosis not present

## 2016-01-26 ENCOUNTER — Other Ambulatory Visit: Payer: Self-pay | Admitting: Family Medicine

## 2016-01-26 ENCOUNTER — Encounter: Payer: Self-pay | Admitting: Family Medicine

## 2016-01-26 MED ORDER — LEVOTHYROXINE SODIUM 175 MCG PO TABS: 175.0000 ug | ORAL_TABLET | Freq: Every day | ORAL | Status: DC

## 2016-02-13 ENCOUNTER — Encounter: Payer: Self-pay | Admitting: Family Medicine

## 2016-02-13 DIAGNOSIS — E785 Hyperlipidemia, unspecified: Secondary | ICD-10-CM

## 2016-02-14 MED ORDER — ATORVASTATIN CALCIUM 80 MG PO TABS: 80.0000 mg | ORAL_TABLET | Freq: Every day | ORAL | 1 refills | Status: DC

## 2016-02-14 MED ORDER — METOPROLOL TARTRATE 50 MG PO TABS: 50.0000 mg | ORAL_TABLET | Freq: Two times a day (BID) | ORAL | 1 refills | Status: DC

## 2016-03-22 ENCOUNTER — Ambulatory Visit (INDEPENDENT_AMBULATORY_CARE_PROVIDER_SITE_OTHER): Payer: Medicare Other

## 2016-03-22 DIAGNOSIS — Z23 Encounter for immunization: Secondary | ICD-10-CM | POA: Diagnosis not present

## 2016-03-22 NOTE — Progress Notes (Signed)
HPI: FU CAD; hx of CAD status post CABG 4 in 2002, HTN, HL, aortic stenosis. He has undergone surgery, chemotherapy and radiation for esophageal cancer. Since last seen, the patient has dyspnea with more extreme activities but not with routine activities. It is relieved with rest. It is not associated with chest pain. There is no orthopnea, PND or pedal edema. There is no syncope or palpitations. There is no exertional chest pain.   Studies/Reports Reviewed Today:  Echo 9/16 - Normal LV systolic function, mild left ventricular hypertrophy, grade 1 diastolic dysfunction, Mild aortic stenosis with mean gradient 16 mmHg, trace aortic insufficiency and mild biatrial enlargement.  Myoview 4/16 LV Ejection Fraction: 52%. LV Wall Motion: NL LV Function; NL Wall Motion  Current Outpatient Prescriptions  Medication Sig Dispense Refill  . aspirin EC 81 MG tablet Take 1 tablet (81 mg total) by mouth daily.    Marland Kitchen atorvastatin (LIPITOR) 80 MG tablet Take 1 tablet (80 mg total) by mouth daily. 90 tablet 1  . levothyroxine (SYNTHROID, LEVOTHROID) 175 MCG tablet Take 1 tablet (175 mcg total) by mouth daily before breakfast. 90 tablet 1  . metoprolol (LOPRESSOR) 50 MG tablet Take 1 tablet (50 mg total) by mouth 2 (two) times daily. 180 tablet 1  . Multiple Vitamin (MULTIVITAMIN) tablet Take 1 tablet by mouth daily.    Glory Rosebush VERIO test strip     . polyethylene glycol (MIRALAX / GLYCOLAX) packet Take 17 g by mouth daily as needed.     No current facility-administered medications for this visit.      Past Medical History:  Diagnosis Date  . Aortic stenosis   . CAD, NATIVE VESSEL 04/13/2008  . DM 05/08/2010  . Esophageal cancer ([place])   . Gall bladder stones 07/20/2014  . 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 Hx of cardiovascular stress test    Lexiscan Myoview 4/16:  No ischemia, EF 52%  . HYPERLIPIDEMIA 05/08/2010  . HYPERTENSION, BENIGN  04/13/2008  . HYPOTHYROIDISM 05/08/2010  . Radiation 08/10/14-09/21/14   50.4 gray to esophagus    Past Surgical History:  Procedure Laterality Date  . CORONARY ARTERY BYPASS GRAFT  2002  . ESOPHAGOGASTRODUODENOSCOPY ENDOSCOPY  07/12/14  . EUS N/A 07/22/2014   Procedure: UPPER ENDOSCOPIC ULTRASOUND (EUS) LINEAR;  Surgeon: Milus Banister, MD;  Location: [facility];  Service: Endoscopy;  Laterality: N/A;    Social History   Social History  . Marital status: Married    Spouse name: N/A  . Number of children: 2  . Years of education: N/A   Occupational History  . Retired English as a second language teacher    Social History Main Topics  . Smoking status: Former Smoker    Packs/day: 1.00    Years: 30.00    Types: Cigarettes    Quit date: 07/30/1999  . Smokeless tobacco: Former Systems developer    Quit date: 07/18/2000  . Alcohol use 0.0 oz/week     Comment: rare - used to drink daily before 2001  . Drug use: No  . Sexual activity: Not on file     Comment: regular exercise    Other Topics Concern  . Not on file   Social History Narrative   Married, wife [name]   #2 grown sons, Shanon Brow and Annie Main   Retired English as a second language teacher   Lives in [place] being outdoors    Family History  Problem Relation Age of Onset  . Alcohol abuse  Father   . Colon cancer Father 80  . Coronary artery disease Mother   . Diabetes type II Mother     diabetes type ll  . Hypertension Mother   . Diabetes Mother     type ll  . Coronary artery disease Brother   . Heart attack Neg Hx   . Stroke Neg Hx     ROS: no fevers or chills, productive cough, hemoptysis, dysphasia, odynophagia, melena, hematochezia, dysuria, hematuria, rash, seizure activity, orthopnea, PND, pedal edema, claudication. Remaining systems are negative.  Physical Exam: Well-developed well-nourished in no acute distress.  Skin is warm and dry.  HEENT is normal.  Neck is supple.  Chest is clear to auscultation with normal expansion.  Cardiovascular exam is regular rate  and rhythm. 2/6 systolic murmur left sternal border. S2 is not diminished.  Abdominal exam nontender or distended. No masses palpated. Extremities show no edema. neuro grossly intact  ECG-Sinus rhythm at a rate of 62. Inferior lateral T-wave inversion.  A/P  1 hyperlipidemia-continue statin. Lipids and liver monitored by primary care.  2 hypertension-blood pressure controlled. Continue present medications.  3 coronary artery disease-continue aspirin and statin.  4 aortic stenosis-mild on most recent echocardiogram. It does not sound severe on examination today. We will likely repeat echocardiogram when he returns in 1 year.  5 esophageal cancer-status post resection. Followed at [facility].  Kirk Ruths, MD

## 2016-03-28 ENCOUNTER — Ambulatory Visit (INDEPENDENT_AMBULATORY_CARE_PROVIDER_SITE_OTHER): Payer: Medicare Other | Admitting: Cardiology

## 2016-03-28 ENCOUNTER — Encounter: Payer: Self-pay | Admitting: Cardiology

## 2016-03-28 VITALS — BP 136/60 | HR 62 | Ht 71.0 in | Wt 232.0 lb

## 2016-03-28 DIAGNOSIS — E785 Hyperlipidemia, unspecified: Secondary | ICD-10-CM

## 2016-03-28 DIAGNOSIS — I2510 Atherosclerotic heart disease of native coronary artery without angina pectoris: Secondary | ICD-10-CM | POA: Diagnosis not present

## 2016-03-28 DIAGNOSIS — I25810 Atherosclerosis of coronary artery bypass graft(s) without angina pectoris: Secondary | ICD-10-CM

## 2016-03-28 DIAGNOSIS — I10 Essential (primary) hypertension: Secondary | ICD-10-CM | POA: Diagnosis not present

## 2016-03-28 DIAGNOSIS — I359 Nonrheumatic aortic valve disorder, unspecified: Secondary | ICD-10-CM

## 2016-03-28 NOTE — Patient Instructions (Addendum)
Your physician wants you to follow-up in: ONE YEAR WITH DR CRENSHAW You will receive a reminder letter in the mail two months in advance. If you don't receive a letter, please call our office to schedule the follow-up appointment.   If you need a refill on your cardiac medications before your next appointment, please call your pharmacy.  

## 2016-04-29 IMAGING — RF DG ESOPHAGUS
14 of 24 series · 14 of 24 positions shown · non-contrast
Comparison: PET-CT 08/07/2014.

CLINICAL DATA: Dysphagia with intermittent regurgitation.
Esophageal cancer diagnosed 2 months ago. Undergoing chemotherapy
and radiation therapy. Subsequent encounter.

EXAM:
ESOPHOGRAM/BARIUM SWALLOW
TECHNIQUE: Single contrast examination was performed using water-soluble
contrast and thin barium.
FLUOROSCOPY TIME:  1 minutes and 5 seconds.

[Series 1: run · 1 of 1 slices shown (1 of 14)]
[im 1/1]
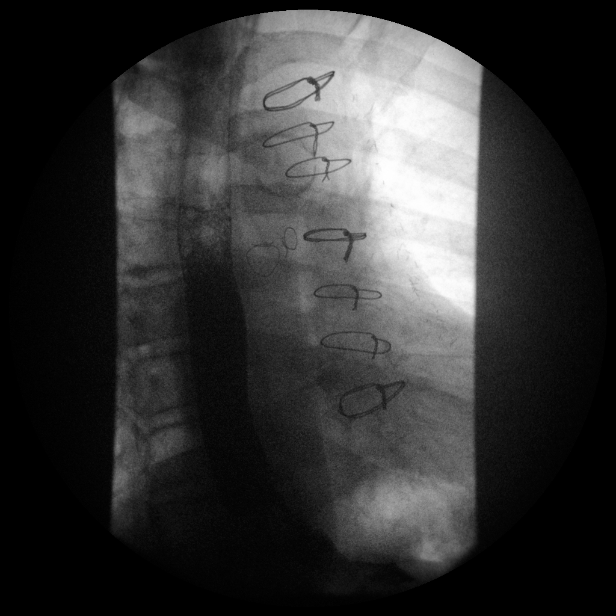

[Series 3: run · 1 of 1 slices shown (2 of 14)]
[im 1/1]
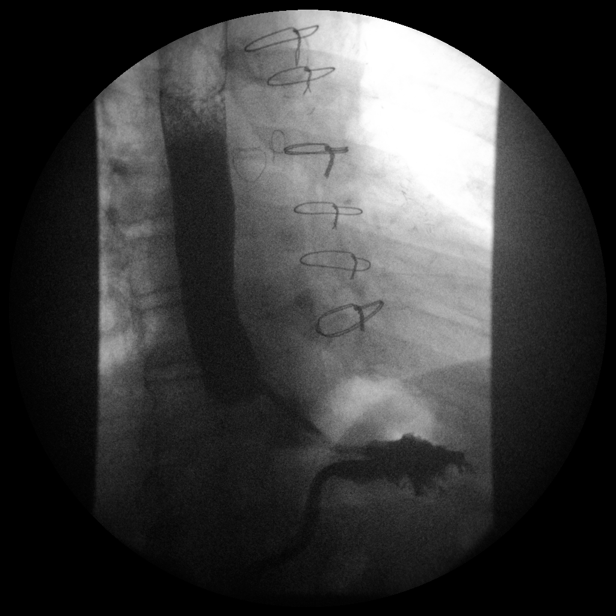

[Series 5: run · 1 of 1 slices shown (3 of 14)]
[im 1/1]
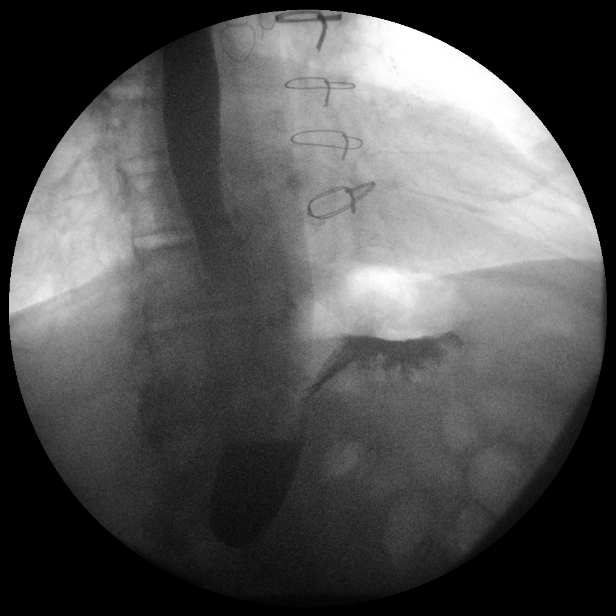

[Series 7: run · 1 of 9 slices shown (4 of 14)]
[im 1/9]
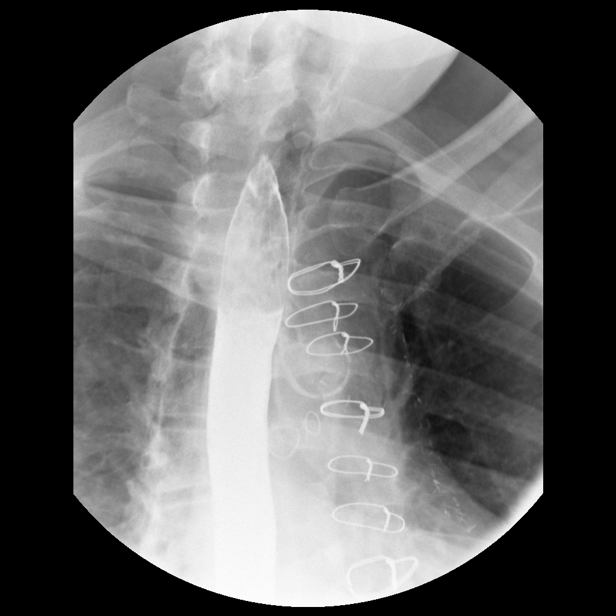

[Series 8: run · 1 of 1 slices shown (5 of 14)]
[im 1/1]
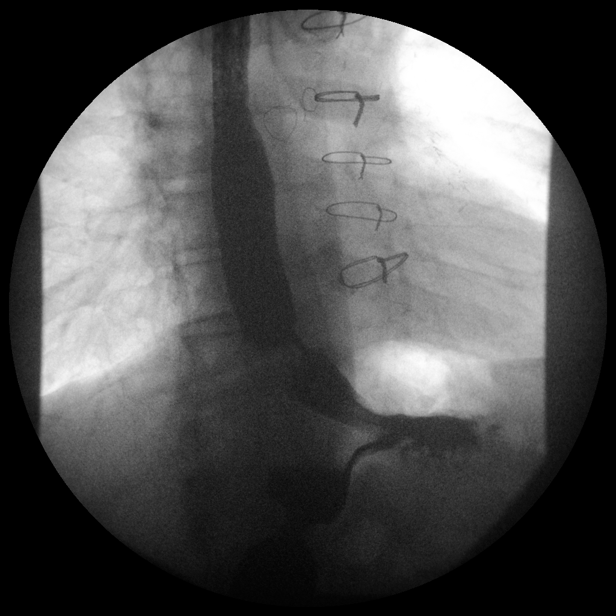

[Series 10: run · 1 of 1 slices shown (6 of 14)]
[im 1/1]
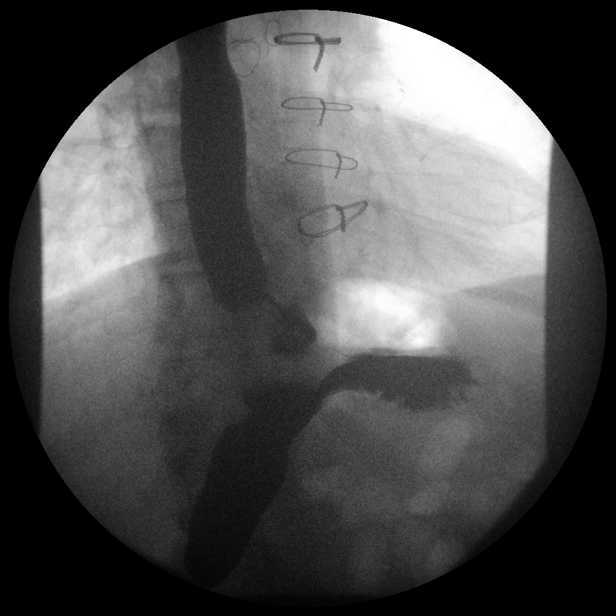

[Series 12: run · 1 of 1 slices shown (7 of 14)]
[im 1/1]
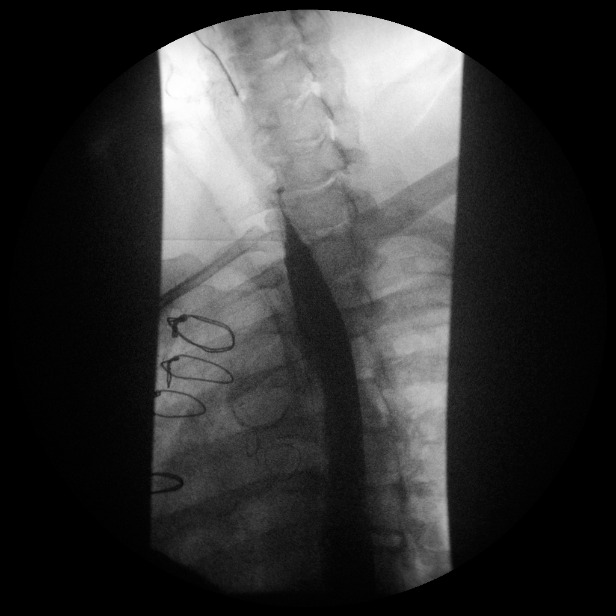

[Series 13: run · 1 of 1 slices shown (8 of 14)]
[im 1/1]
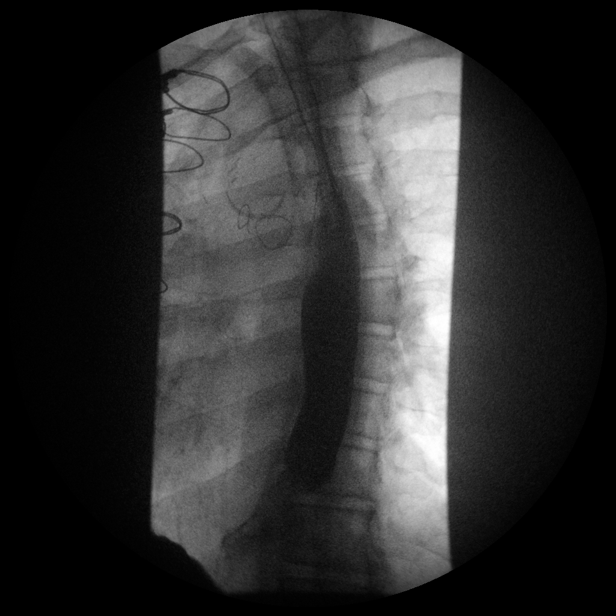

[Series 15: run · 1 of 1 slices shown (9 of 14)]
[im 1/1]
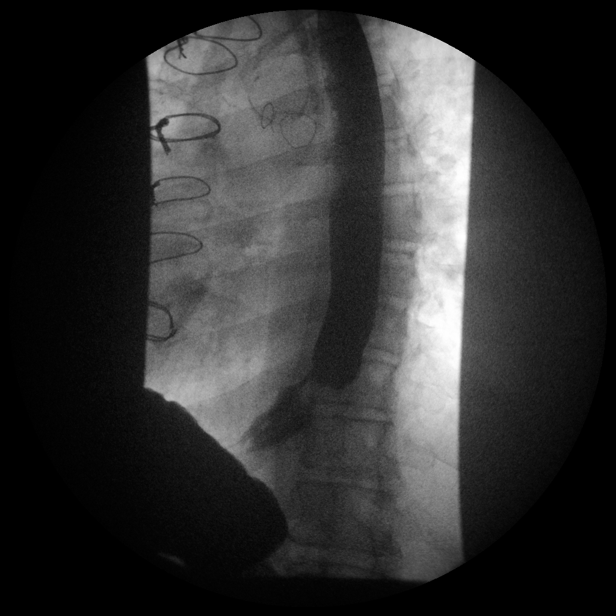

[Series 17: run · 1 of 1 slices shown (10 of 14)]
[im 1/1]
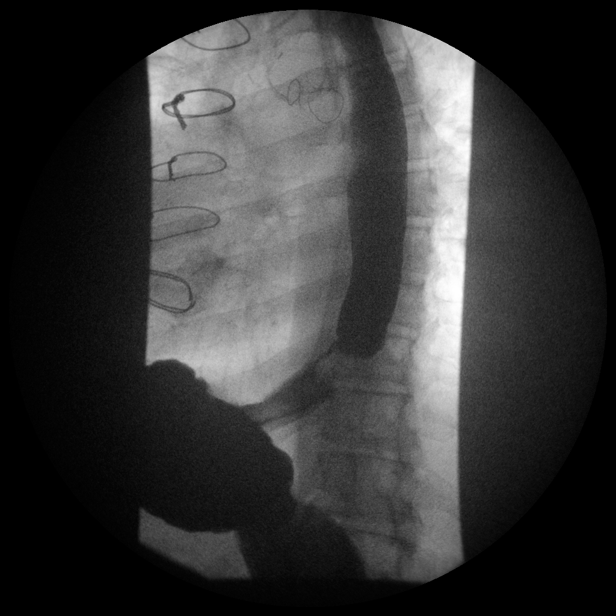

[Series 19: run · 1 of 1 slices shown (11 of 14)]
[im 1/1]
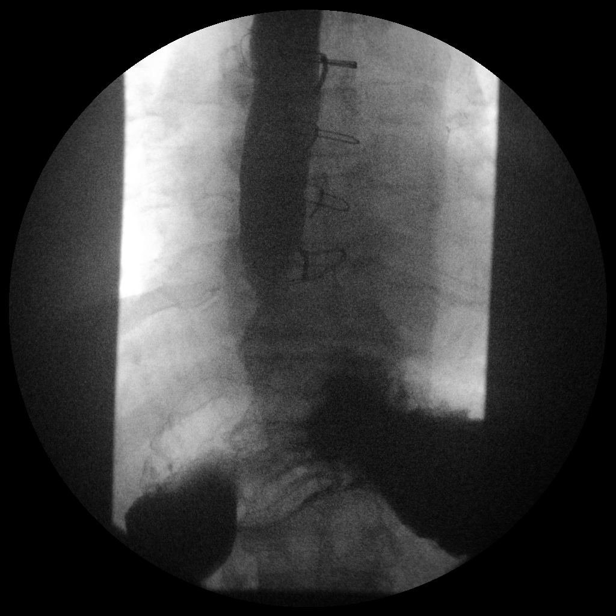

[Series 20: run · 1 of 1 slices shown (12 of 14)]
[im 1/1]
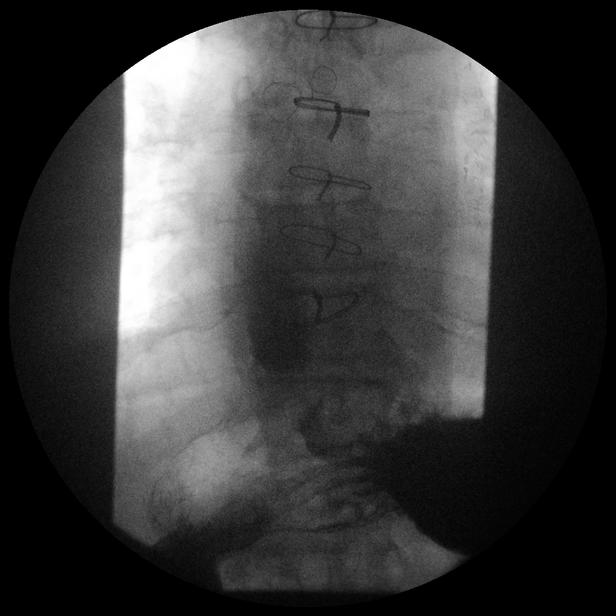

[Series 22: run · 1 of 1 slices shown (13 of 14)]
[im 1/1]
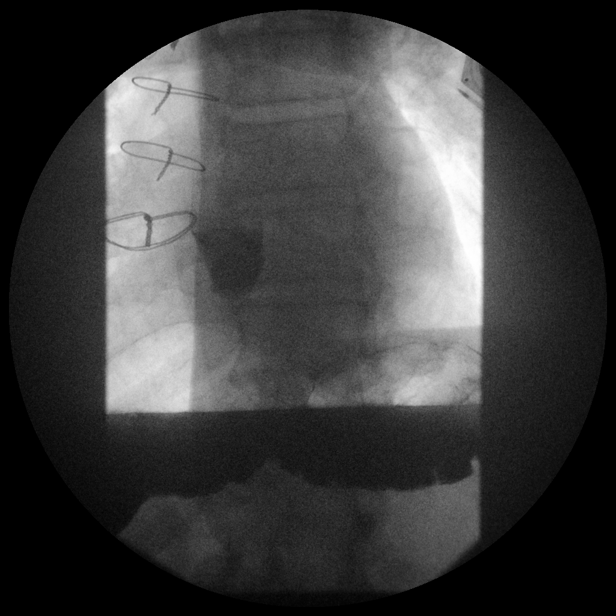

[Series 24: run · 1 of 1 slices shown (14 of 14)]
[im 1/1]
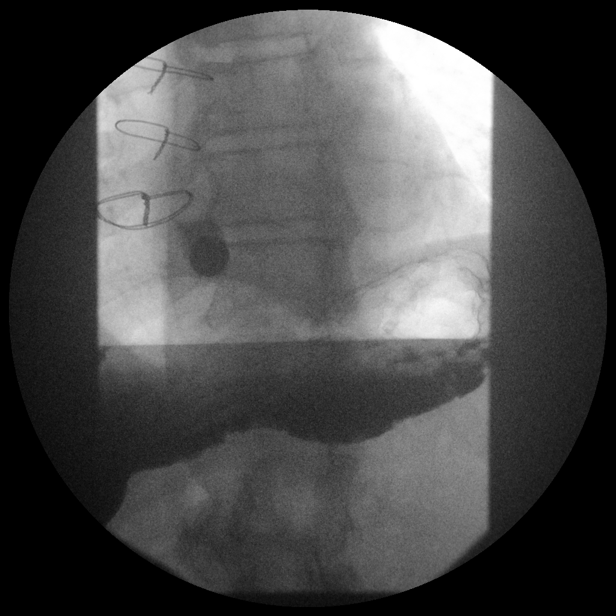

[14 of 24 positions shown; findings below may reference images not displayed]

FINDINGS: Study was initiated with Omnipaque 300 in the erect position. The
esophageal motility appears normal. There is irregular narrowing of
the lumen of the distal esophagus at the site of the known
malignancy. However, there is no high-grade obstruction or
extravasation.

Subsequently, thin barium was administered in the erect and right
anterior oblique prone positions. The esophageal motility appears
within normal limits. There is persistent irregular narrowing of the
distal esophageal lumen. However, barium passes into the stomach.
There is no extravasation.

At the conclusion of the study, a 13 mm barium tablet was
administered. This passed without delay into the distal esophagus,
although was retained proximal to the irregular distal esophageal
stricture and did not pass into the stomach during greater than 3
minutes of intermittent fluoroscopic observation.
IMPRESSION: 1. Irregular distal esophageal stricture at site of known esophageal
malignancy. This may be related to residual tumor and/or edema from
radiation therapy. This prevented the passage of a 13 mm barium
tablet.
2. No obstruction to passage of thin barium or extravasation
demonstrated.

## 2016-06-05 DIAGNOSIS — R911 Solitary pulmonary nodule: Secondary | ICD-10-CM | POA: Diagnosis not present

## 2016-06-05 DIAGNOSIS — C159 Malignant neoplasm of esophagus, unspecified: Secondary | ICD-10-CM | POA: Diagnosis not present

## 2016-06-12 DIAGNOSIS — L570 Actinic keratosis: Secondary | ICD-10-CM | POA: Diagnosis not present

## 2016-06-12 DIAGNOSIS — L0202 Furuncle of face: Secondary | ICD-10-CM | POA: Diagnosis not present

## 2016-06-12 DIAGNOSIS — D485 Neoplasm of uncertain behavior of skin: Secondary | ICD-10-CM | POA: Diagnosis not present

## 2016-06-12 DIAGNOSIS — L821 Other seborrheic keratosis: Secondary | ICD-10-CM | POA: Diagnosis not present

## 2016-06-12 DIAGNOSIS — D0439 Carcinoma in situ of skin of other parts of face: Secondary | ICD-10-CM | POA: Diagnosis not present

## 2016-06-19 ENCOUNTER — Encounter: Payer: Self-pay | Admitting: Family Medicine

## 2016-06-19 ENCOUNTER — Ambulatory Visit (INDEPENDENT_AMBULATORY_CARE_PROVIDER_SITE_OTHER): Payer: Medicare Other | Admitting: Family Medicine

## 2016-06-19 VITALS — BP 140/60 | HR 76 | Temp 98.1°F | Ht 71.5 in | Wt 234.6 lb

## 2016-06-19 DIAGNOSIS — E038 Other specified hypothyroidism: Secondary | ICD-10-CM | POA: Diagnosis not present

## 2016-06-19 DIAGNOSIS — I2510 Atherosclerotic heart disease of native coronary artery without angina pectoris: Secondary | ICD-10-CM | POA: Diagnosis not present

## 2016-06-19 DIAGNOSIS — I10 Essential (primary) hypertension: Secondary | ICD-10-CM | POA: Diagnosis not present

## 2016-06-19 DIAGNOSIS — E119 Type 2 diabetes mellitus without complications: Secondary | ICD-10-CM | POA: Diagnosis not present

## 2016-06-19 DIAGNOSIS — I25810 Atherosclerosis of coronary artery bypass graft(s) without angina pectoris: Secondary | ICD-10-CM

## 2016-06-19 LAB — BASIC METABOLIC PANEL
BUN: 12 mg/dL (ref 6–23)
CHLORIDE: 104 meq/L (ref 96–112)
CO2: 33 mEq/L — ABNORMAL HIGH (ref 19–32)
Calcium: 9.5 mg/dL (ref 8.4–10.5)
Creatinine, Ser: 1.05 mg/dL (ref 0.40–1.50)
GFR: 73.54 mL/min (ref >60.00)
GLUCOSE: 107 mg/dL — AB (ref 70–99)
POTASSIUM: 4 meq/L (ref 3.5–5.1)
SODIUM: 143 meq/L (ref 135–145)

## 2016-06-19 LAB — LIPID PANEL
CHOL/HDL RATIO: 3
Cholesterol: 92 mg/dL (ref 0–200)
HDL: 28.9 mg/dL — AB (ref >39.00)
LDL CALC: 41 mg/dL (ref 0–99)
NonHDL: 63.53
TRIGLYCERIDES: 112 mg/dL (ref 0.0–149.0)
VLDL: 22.4 mg/dL (ref 0.0–40.0)

## 2016-06-19 LAB — TSH: TSH: 0.98 u[IU]/mL (ref 0.35–4.50)

## 2016-06-19 LAB — HEPATIC FUNCTION PANEL
ALT: 18 U/L (ref 0–53)
AST: 25 U/L (ref 0–37)
Albumin: 3.7 g/dL (ref 3.5–5.2)
Alkaline Phosphatase: 72 U/L (ref 39–117)
BILIRUBIN DIRECT: 0.2 mg/dL (ref 0.0–0.3)
BILIRUBIN TOTAL: 0.9 mg/dL (ref 0.2–1.2)
Total Protein: 6.4 g/dL (ref 6.0–8.3)

## 2016-06-19 LAB — MICROALBUMIN / CREATININE URINE RATIO
CREATININE, U: 77 mg/dL
MICROALB/CREAT RATIO: 2.3 mg/g (ref 0.0–30.0)
Microalb, Ur: 1.8 mg/dL (ref 0.0–1.9)

## 2016-06-19 LAB — HEMOGLOBIN A1C: Hgb A1c MFr Bld: 5.8 % (ref 4.6–6.5)

## 2016-06-19 NOTE — Progress Notes (Signed)
Pre visit review using our clinic review tool, if applicable. No additional management support is needed unless otherwise documented below in the visit note. 

## 2016-06-19 NOTE — Progress Notes (Signed)
Subjective:     Patient ID: [name], male   DOB: 14-Sep-1940, 75 y.o.   MRN: [id number]  HPI Seen for medical follow-up. He has esophageal cancer and is followed [facility] and has had recent checkup there and his scans were normal. He is almost 2 years out from his diagnosis. His chronic medical problems include history of CAD, hyperlipidemia, hypertension, hypothyroidism, type 2 diabetes. Diabetes is been controlled without medication thus far. Recent fasting blood sugars consistently low 100s. Medications reviewed. Compliant with all. No recent chest pains. No headaches. No dizziness. He has been gaining some weight slowly over the past year. No neuropathy symptoms. He had eye exam last May [facility]. No blurred vision.  Past Medical History:  Diagnosis Date  . Aortic stenosis   . CAD, NATIVE VESSEL 04/13/2008  . DM 05/08/2010  . Esophageal cancer ([place])   . Gall bladder stones 07/20/2014  . 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 Hx of cardiovascular stress test    Lexiscan Myoview 4/16:  No ischemia, EF 52%  . HYPERLIPIDEMIA 05/08/2010  . HYPERTENSION, BENIGN 04/13/2008  . HYPOTHYROIDISM 05/08/2010  . Radiation 08/10/14-09/21/14   50.4 gray to esophagus   Past Surgical History:  Procedure Laterality Date  . CORONARY ARTERY BYPASS GRAFT  2002  . ESOPHAGOGASTRODUODENOSCOPY ENDOSCOPY  07/12/14  . EUS N/A 07/22/2014   Procedure: UPPER ENDOSCOPIC ULTRASOUND (EUS) LINEAR;  Surgeon: Milus Banister, MD;  [facility];  Service: Endoscopy;  Laterality: N/A;    reports that he quit smoking about 16 years ago. His smoking use included Cigarettes. He has a 30.00 pack-year smoking history. He quit smokeless tobacco use about 15 years ago. He reports that he drinks alcohol. He reports that he does not use drugs. family history includes Alcohol abuse in his father; Colon cancer (age of onset: 14) in his father; Coronary artery disease in  his brother and mother; Diabetes in his mother; Diabetes type II in his mother; Hypertension in his mother. No Known Allergies   Review of Systems  Constitutional: Negative for appetite change, fatigue and unexpected weight change.  Eyes: Negative for visual disturbance.  Respiratory: Negative for cough, chest tightness and shortness of breath.   Cardiovascular: Negative for chest pain, palpitations and leg swelling.  Endocrine: Negative for polydipsia and polyuria.  Genitourinary: Negative for dysuria.  Neurological: Negative for dizziness, syncope, weakness, light-headedness and headaches.       Objective:   Physical Exam  Constitutional: He is oriented to person, place, and time. He appears well-developed and well-nourished.  HENT:  Right Ear: External ear normal.  Left Ear: External ear normal.  Mouth/Throat: Oropharynx is clear and moist.  Eyes: Pupils are equal, round, and reactive to light.  Neck: Neck supple. No thyromegaly present.  Cardiovascular: Normal rate and regular rhythm.   Pulmonary/Chest: Effort normal and breath sounds normal. No respiratory distress. He has no wheezes. He has no rales.  Musculoskeletal: He exhibits no edema.  Neurological: He is alert and oriented to person, place, and time.  Skin: No rash noted.  Psychiatric: He has a normal mood and affect. His behavior is normal.       Assessment:     #1 type 2 diabetes which has been well controlled without medication  #2 hypertension. Borderline elevation today but consistently well controlled at home recently  #3 history of CAD  #4 hypothyroidism  #5 hyperlipidemia  Plan:     -Check labs today with hemoglobin [id number], basic metabolic panel, lipid panel, urine microalbumin, hepatic panel, TSH. -Avoid excessive weight gain -Continue regular aerobic exercise such as walking -Continue yearly eye exam -Routine follow-up in 6 months and sooner as needed  [name] Post MD [facility] at [facility]

## 2016-06-26 DIAGNOSIS — D0439 Carcinoma in situ of skin of other parts of face: Secondary | ICD-10-CM | POA: Diagnosis not present

## 2016-07-09 ENCOUNTER — Other Ambulatory Visit: Payer: Self-pay | Admitting: Nurse Practitioner

## 2016-07-16 ENCOUNTER — Encounter: Payer: Self-pay | Admitting: Family Medicine

## 2016-07-16 MED ORDER — LEVOTHYROXINE SODIUM 175 MCG PO TABS: 175.0000 ug | ORAL_TABLET | Freq: Every day | ORAL | 1 refills | Status: DC

## 2016-08-08 ENCOUNTER — Other Ambulatory Visit: Payer: Self-pay | Admitting: Family Medicine

## 2016-08-08 DIAGNOSIS — E785 Hyperlipidemia, unspecified: Secondary | ICD-10-CM

## 2016-12-06 DIAGNOSIS — Z87891 Personal history of nicotine dependence: Secondary | ICD-10-CM | POA: Diagnosis not present

## 2016-12-06 DIAGNOSIS — R918 Other nonspecific abnormal finding of lung field: Secondary | ICD-10-CM | POA: Diagnosis not present

## 2016-12-06 DIAGNOSIS — C159 Malignant neoplasm of esophagus, unspecified: Secondary | ICD-10-CM | POA: Diagnosis not present

## 2016-12-07 DIAGNOSIS — C159 Malignant neoplasm of esophagus, unspecified: Secondary | ICD-10-CM | POA: Diagnosis not present

## 2016-12-07 DIAGNOSIS — R918 Other nonspecific abnormal finding of lung field: Secondary | ICD-10-CM | POA: Diagnosis not present

## 2016-12-07 DIAGNOSIS — Z9221 Personal history of antineoplastic chemotherapy: Secondary | ICD-10-CM | POA: Diagnosis not present

## 2016-12-07 DIAGNOSIS — Z923 Personal history of irradiation: Secondary | ICD-10-CM | POA: Diagnosis not present

## 2016-12-11 DIAGNOSIS — J9589 Other postprocedural complications and disorders of respiratory system, not elsewhere classified: Secondary | ICD-10-CM | POA: Diagnosis not present

## 2016-12-11 DIAGNOSIS — Z9889 Other specified postprocedural states: Secondary | ICD-10-CM | POA: Diagnosis not present

## 2016-12-11 DIAGNOSIS — J9601 Acute respiratory failure with hypoxia: Secondary | ICD-10-CM | POA: Diagnosis not present

## 2016-12-11 DIAGNOSIS — I25119 Atherosclerotic heart disease of native coronary artery with unspecified angina pectoris: Secondary | ICD-10-CM | POA: Diagnosis not present

## 2016-12-11 DIAGNOSIS — R918 Other nonspecific abnormal finding of lung field: Secondary | ICD-10-CM | POA: Diagnosis not present

## 2016-12-11 DIAGNOSIS — R05 Cough: Secondary | ICD-10-CM | POA: Diagnosis not present

## 2016-12-11 DIAGNOSIS — J982 Interstitial emphysema: Secondary | ICD-10-CM | POA: Diagnosis not present

## 2016-12-11 DIAGNOSIS — R5383 Other fatigue: Secondary | ICD-10-CM | POA: Diagnosis not present

## 2016-12-11 DIAGNOSIS — C771 Secondary and unspecified malignant neoplasm of intrathoracic lymph nodes: Secondary | ICD-10-CM | POA: Diagnosis not present

## 2016-12-11 DIAGNOSIS — R0902 Hypoxemia: Secondary | ICD-10-CM | POA: Diagnosis not present

## 2016-12-11 DIAGNOSIS — T17908A Unspecified foreign body in respiratory tract, part unspecified causing other injury, initial encounter: Secondary | ICD-10-CM | POA: Diagnosis not present

## 2016-12-11 DIAGNOSIS — C159 Malignant neoplasm of esophagus, unspecified: Secondary | ICD-10-CM | POA: Diagnosis not present

## 2016-12-11 DIAGNOSIS — R000 Tachycardia, unspecified: Secondary | ICD-10-CM | POA: Diagnosis not present

## 2016-12-11 DIAGNOSIS — J9811 Atelectasis: Secondary | ICD-10-CM | POA: Diagnosis not present

## 2016-12-11 DIAGNOSIS — R0602 Shortness of breath: Secondary | ICD-10-CM | POA: Diagnosis not present

## 2016-12-11 DIAGNOSIS — J690 Pneumonitis due to inhalation of food and vomit: Secondary | ICD-10-CM | POA: Diagnosis not present

## 2016-12-14 DIAGNOSIS — K219 Gastro-esophageal reflux disease without esophagitis: Secondary | ICD-10-CM | POA: Diagnosis present

## 2016-12-14 DIAGNOSIS — Z87891 Personal history of nicotine dependence: Secondary | ICD-10-CM | POA: Diagnosis not present

## 2016-12-14 DIAGNOSIS — N179 Acute kidney failure, unspecified: Secondary | ICD-10-CM | POA: Diagnosis not present

## 2016-12-14 DIAGNOSIS — I10 Essential (primary) hypertension: Secondary | ICD-10-CM | POA: Diagnosis present

## 2016-12-14 DIAGNOSIS — T8182XA Emphysema (subcutaneous) resulting from a procedure, initial encounter: Secondary | ICD-10-CM | POA: Diagnosis present

## 2016-12-14 DIAGNOSIS — R05 Cough: Secondary | ICD-10-CM | POA: Diagnosis not present

## 2016-12-14 DIAGNOSIS — Z923 Personal history of irradiation: Secondary | ICD-10-CM | POA: Diagnosis not present

## 2016-12-14 DIAGNOSIS — R1312 Dysphagia, oropharyngeal phase: Secondary | ICD-10-CM | POA: Diagnosis not present

## 2016-12-14 DIAGNOSIS — R938 Abnormal findings on diagnostic imaging of other specified body structures: Secondary | ICD-10-CM | POA: Diagnosis not present

## 2016-12-14 DIAGNOSIS — I25119 Atherosclerotic heart disease of native coronary artery with unspecified angina pectoris: Secondary | ICD-10-CM | POA: Diagnosis present

## 2016-12-14 DIAGNOSIS — Z9221 Personal history of antineoplastic chemotherapy: Secondary | ICD-10-CM | POA: Diagnosis not present

## 2016-12-14 DIAGNOSIS — C779 Secondary and unspecified malignant neoplasm of lymph node, unspecified: Secondary | ICD-10-CM | POA: Diagnosis not present

## 2016-12-14 DIAGNOSIS — R0602 Shortness of breath: Secondary | ICD-10-CM | POA: Diagnosis not present

## 2016-12-14 DIAGNOSIS — J690 Pneumonitis due to inhalation of food and vomit: Secondary | ICD-10-CM | POA: Diagnosis present

## 2016-12-14 DIAGNOSIS — Z9889 Other specified postprocedural states: Secondary | ICD-10-CM | POA: Diagnosis not present

## 2016-12-14 DIAGNOSIS — R5383 Other fatigue: Secondary | ICD-10-CM | POA: Diagnosis not present

## 2016-12-14 DIAGNOSIS — E119 Type 2 diabetes mellitus without complications: Secondary | ICD-10-CM | POA: Diagnosis present

## 2016-12-14 DIAGNOSIS — J680 Bronchitis and pneumonitis due to chemicals, gases, fumes and vapors: Secondary | ICD-10-CM | POA: Diagnosis not present

## 2016-12-14 DIAGNOSIS — I350 Nonrheumatic aortic (valve) stenosis: Secondary | ICD-10-CM | POA: Diagnosis present

## 2016-12-14 DIAGNOSIS — E785 Hyperlipidemia, unspecified: Secondary | ICD-10-CM | POA: Diagnosis present

## 2016-12-14 DIAGNOSIS — Z7982 Long term (current) use of aspirin: Secondary | ICD-10-CM | POA: Diagnosis not present

## 2016-12-14 DIAGNOSIS — C771 Secondary and unspecified malignant neoplasm of intrathoracic lymph nodes: Secondary | ICD-10-CM | POA: Diagnosis present

## 2016-12-14 DIAGNOSIS — T17908A Unspecified foreign body in respiratory tract, part unspecified causing other injury, initial encounter: Secondary | ICD-10-CM | POA: Diagnosis not present

## 2016-12-14 DIAGNOSIS — J398 Other specified diseases of upper respiratory tract: Secondary | ICD-10-CM | POA: Diagnosis not present

## 2016-12-14 DIAGNOSIS — J9811 Atelectasis: Secondary | ICD-10-CM | POA: Diagnosis present

## 2016-12-14 DIAGNOSIS — R000 Tachycardia, unspecified: Secondary | ICD-10-CM | POA: Diagnosis not present

## 2016-12-14 DIAGNOSIS — J982 Interstitial emphysema: Secondary | ICD-10-CM | POA: Diagnosis not present

## 2016-12-14 DIAGNOSIS — R599 Enlarged lymph nodes, unspecified: Secondary | ICD-10-CM | POA: Diagnosis not present

## 2016-12-14 DIAGNOSIS — J9601 Acute respiratory failure with hypoxia: Secondary | ICD-10-CM | POA: Diagnosis present

## 2016-12-14 DIAGNOSIS — Z8501 Personal history of malignant neoplasm of esophagus: Secondary | ICD-10-CM | POA: Diagnosis not present

## 2016-12-14 DIAGNOSIS — R0902 Hypoxemia: Secondary | ICD-10-CM | POA: Diagnosis not present

## 2016-12-14 DIAGNOSIS — Z951 Presence of aortocoronary bypass graft: Secondary | ICD-10-CM | POA: Diagnosis not present

## 2016-12-14 DIAGNOSIS — J954 Chemical pneumonitis due to anesthesia: Secondary | ICD-10-CM | POA: Diagnosis not present

## 2016-12-14 DIAGNOSIS — E039 Hypothyroidism, unspecified: Secondary | ICD-10-CM | POA: Diagnosis present

## 2016-12-14 DIAGNOSIS — J9589 Other postprocedural complications and disorders of respiratory system, not elsewhere classified: Secondary | ICD-10-CM | POA: Diagnosis present

## 2016-12-14 DIAGNOSIS — R918 Other nonspecific abnormal finding of lung field: Secondary | ICD-10-CM | POA: Diagnosis not present

## 2016-12-14 DIAGNOSIS — Z7984 Long term (current) use of oral hypoglycemic drugs: Secondary | ICD-10-CM | POA: Diagnosis not present

## 2016-12-18 ENCOUNTER — Ambulatory Visit: Payer: Medicare Other | Admitting: Family Medicine

## 2016-12-21 ENCOUNTER — Encounter: Payer: Self-pay | Admitting: Family Medicine

## 2016-12-21 ENCOUNTER — Ambulatory Visit (INDEPENDENT_AMBULATORY_CARE_PROVIDER_SITE_OTHER): Payer: Medicare Other | Admitting: Family Medicine

## 2016-12-21 VITALS — BP 118/70 | HR 82 | Temp 98.1°F | Ht 71.5 in | Wt 234.2 lb

## 2016-12-21 DIAGNOSIS — C159 Malignant neoplasm of esophagus, unspecified: Secondary | ICD-10-CM

## 2016-12-21 DIAGNOSIS — J690 Pneumonitis due to inhalation of food and vomit: Secondary | ICD-10-CM

## 2016-12-21 MED ORDER — AMOXICILLIN-POT CLAVULANATE 875-125 MG PO TABS: 1.0000 | ORAL_TABLET | Freq: Two times a day (BID) | ORAL | 0 refills | Status: DC

## 2016-12-21 NOTE — Progress Notes (Signed)
Subjective:     Patient ID: [name], male   DOB: 01/01/1941, 76 y.o.   MRN: [id number]  HPI Patient seen for hospital follow-up at [facility]. He has history of esophageal cancer and had recent follow-up x-rays which showed evidence for possible metastatic adenocarcinoma. This was confirmed with PET scan. He was underwent ultrasound guided endoscopic biopsy and during the procedure had significant aspiration. Was diagnosed with aspiration pneumonia. White count went up to 18,000. This was down to 10,000 prior to discharge. Discharged on Augmentin 875 mg twice daily.  He has follow-up with oncologist next week. Will likely get radiation therapy. Has only occasional cough. No fever at this time. Still had some low-grade fever until 2 days ago. Cough occasionally productive. No hemoptysis. No dyspnea. General fatigue.  Past Medical History:  Diagnosis Date  . Aortic stenosis   . CAD, NATIVE VESSEL 04/13/2008  . DM 05/08/2010  . Esophageal cancer ([place])   . Gall bladder stones 07/20/2014  . 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 Hx of cardiovascular stress test    Lexiscan Myoview 4/16:  No ischemia, EF 52%  . HYPERLIPIDEMIA 05/08/2010  . HYPERTENSION, BENIGN 04/13/2008  . HYPOTHYROIDISM 05/08/2010  . Radiation 08/10/14-09/21/14   50.4 gray to esophagus   Past Surgical History:  Procedure Laterality Date  . CORONARY ARTERY BYPASS GRAFT  2002  . ESOPHAGOGASTRODUODENOSCOPY ENDOSCOPY  07/12/14  . EUS N/A 07/22/2014   Procedure: UPPER ENDOSCOPIC ULTRASOUND (EUS) LINEAR;  Surgeon: Milus Banister, MD;  [facility]: [facility];  Service: Endoscopy;  Laterality: N/A;    reports that he quit smoking about 17 years ago. His smoking use included Cigarettes. He has a 30.00 pack-year smoking history. He quit smokeless tobacco use about 16 years ago. He reports that he drinks alcohol. He reports that he does not use drugs. family history includes Alcohol  abuse in his father; Colon cancer (age of onset: 38) in his father; Coronary artery disease in his brother and mother; Diabetes in his mother; Diabetes type II in his mother; Hypertension in his mother. No Known Allergies   Review of Systems  Constitutional: Positive for fatigue. Negative for fever.  Respiratory: Positive for cough. Negative for shortness of breath and wheezing.   Cardiovascular: Negative for chest pain and palpitations.  Genitourinary: Negative for dysuria.       Objective:   Physical Exam  Constitutional: He appears well-developed and well-nourished.  Neck: Neck supple.  Cardiovascular: Normal rate and regular rhythm.   Pulmonary/Chest: Effort normal and breath sounds normal. No respiratory distress. He has no wheezes. He has no rales.  Musculoskeletal: He exhibits no edema.       Assessment:     #1 history of esophageal cancer with evidence for recent metastatic adenocarcinoma by recent PET scan and biopsy  #2 aspiration pneumonia following endoscopic biopsy procedure-with known aspiration event    Plan:     -He is finishing out Augmentin today which makes 7 days. -No further antibiotics unless he has any recurrent fever, increased shortness of breath, or other concern. -Already has scheduled follow-up with [place] in a couple weeks  Eulas Post MD [facility] at [facility]

## 2016-12-21 NOTE — Patient Instructions (Signed)
Follow up for any fever or increasing shortness of breath. 

## 2016-12-27 DIAGNOSIS — C159 Malignant neoplasm of esophagus, unspecified: Secondary | ICD-10-CM | POA: Diagnosis not present

## 2016-12-27 DIAGNOSIS — Z79899 Other long term (current) drug therapy: Secondary | ICD-10-CM | POA: Diagnosis not present

## 2016-12-27 DIAGNOSIS — Z923 Personal history of irradiation: Secondary | ICD-10-CM | POA: Diagnosis not present

## 2016-12-27 DIAGNOSIS — J690 Pneumonitis due to inhalation of food and vomit: Secondary | ICD-10-CM | POA: Diagnosis not present

## 2016-12-27 DIAGNOSIS — C771 Secondary and unspecified malignant neoplasm of intrathoracic lymph nodes: Secondary | ICD-10-CM | POA: Diagnosis not present

## 2016-12-27 DIAGNOSIS — Z5181 Encounter for therapeutic drug level monitoring: Secondary | ICD-10-CM | POA: Diagnosis not present

## 2016-12-27 DIAGNOSIS — Z87891 Personal history of nicotine dependence: Secondary | ICD-10-CM | POA: Diagnosis not present

## 2016-12-28 ENCOUNTER — Telehealth: Payer: Self-pay | Admitting: *Deleted

## 2016-12-28 NOTE — Telephone Encounter (Signed)
On 12-28-16 fax medical records to donna wimberley at [facility], it was consult note, end of tx note, sim & planning note, follow up note,  And grace is going to do the dosimetry part.

## 2016-12-31 DIAGNOSIS — C159 Malignant neoplasm of esophagus, unspecified: Secondary | ICD-10-CM | POA: Diagnosis not present

## 2017-01-03 DIAGNOSIS — Z5111 Encounter for antineoplastic chemotherapy: Secondary | ICD-10-CM | POA: Diagnosis not present

## 2017-01-03 DIAGNOSIS — C159 Malignant neoplasm of esophagus, unspecified: Secondary | ICD-10-CM | POA: Diagnosis not present

## 2017-01-17 DIAGNOSIS — C771 Secondary and unspecified malignant neoplasm of intrathoracic lymph nodes: Secondary | ICD-10-CM | POA: Diagnosis not present

## 2017-01-17 DIAGNOSIS — R110 Nausea: Secondary | ICD-10-CM | POA: Diagnosis not present

## 2017-01-17 DIAGNOSIS — Z923 Personal history of irradiation: Secondary | ICD-10-CM | POA: Diagnosis not present

## 2017-01-17 DIAGNOSIS — C160 Malignant neoplasm of cardia: Secondary | ICD-10-CM | POA: Diagnosis not present

## 2017-01-17 DIAGNOSIS — R05 Cough: Secondary | ICD-10-CM | POA: Diagnosis not present

## 2017-01-17 DIAGNOSIS — Z9049 Acquired absence of other specified parts of digestive tract: Secondary | ICD-10-CM | POA: Diagnosis not present

## 2017-01-17 DIAGNOSIS — C159 Malignant neoplasm of esophagus, unspecified: Secondary | ICD-10-CM | POA: Diagnosis not present

## 2017-01-17 DIAGNOSIS — R0989 Other specified symptoms and signs involving the circulatory and respiratory systems: Secondary | ICD-10-CM | POA: Diagnosis not present

## 2017-01-17 DIAGNOSIS — R5383 Other fatigue: Secondary | ICD-10-CM | POA: Diagnosis not present

## 2017-01-17 DIAGNOSIS — Z87891 Personal history of nicotine dependence: Secondary | ICD-10-CM | POA: Diagnosis not present

## 2017-01-17 DIAGNOSIS — Z5111 Encounter for antineoplastic chemotherapy: Secondary | ICD-10-CM | POA: Diagnosis not present

## 2017-01-21 ENCOUNTER — Ambulatory Visit: Payer: Medicare Other | Admitting: Family Medicine

## 2017-01-22 ENCOUNTER — Other Ambulatory Visit: Payer: Self-pay | Admitting: Family Medicine

## 2017-01-24 ENCOUNTER — Encounter: Payer: Self-pay | Admitting: Family Medicine

## 2017-01-29 ENCOUNTER — Ambulatory Visit (INDEPENDENT_AMBULATORY_CARE_PROVIDER_SITE_OTHER): Payer: Medicare Other | Admitting: Family Medicine

## 2017-01-29 ENCOUNTER — Encounter: Payer: Self-pay | Admitting: Family Medicine

## 2017-01-29 VITALS — BP 102/64 | HR 76 | Temp 97.4°F | Wt 223.9 lb

## 2017-01-29 DIAGNOSIS — C159 Malignant neoplasm of esophagus, unspecified: Secondary | ICD-10-CM

## 2017-01-29 DIAGNOSIS — I10 Essential (primary) hypertension: Secondary | ICD-10-CM | POA: Diagnosis not present

## 2017-01-29 DIAGNOSIS — E119 Type 2 diabetes mellitus without complications: Secondary | ICD-10-CM | POA: Diagnosis not present

## 2017-01-29 NOTE — Progress Notes (Signed)
Subjective:     Patient ID: [name], male   DOB: 1940/12/25, 76 y.o.   MRN: [id number]  HPI Patient has chronic problems including history of hypertension, CAD, hypothyroidism, type 2 diabetes, psoriasis, esophageal cancer. He had evidence for recent recurrence of esophageal cancer at anastomosis site proximally. He is followed at [facility]. They currently have him on every 2 week chemotherapy.   Patient does have past history of diabetes but he had extensive weight loss following diagnosis of cancer and blood sugars have been very stable. A1c was 5.8 back in December 2017. His blood sugars have consistently been < 100 fasting.. Currently not on any diabetes medications. He has hypothyroidism on thyroid  replacement and TSH was normal last December. Compliant with all medications.  Patient denies any recent chest pains. He's had some nausea with his chemotherapy but coping fairly well.  Past Medical History:  Diagnosis Date  . Aortic stenosis   . CAD, NATIVE VESSEL 04/13/2008  . DM 05/08/2010  . Esophageal cancer ([place])   . Gall bladder stones 07/20/2014  . 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 Hx of cardiovascular stress test    Lexiscan Myoview 4/16:  No ischemia, EF 52%  . HYPERLIPIDEMIA 05/08/2010  . HYPERTENSION, BENIGN 04/13/2008  . HYPOTHYROIDISM 05/08/2010  . Radiation 08/10/14-09/21/14   50.4 gray to esophagus   Past Surgical History:  Procedure Laterality Date  . CORONARY ARTERY BYPASS GRAFT  2002  . ESOPHAGOGASTRODUODENOSCOPY ENDOSCOPY  07/12/14  . EUS N/A 07/22/2014   Procedure: UPPER ENDOSCOPIC ULTRASOUND (EUS) LINEAR;  Surgeon: Milus Banister, MD;  [facility];  Service: Endoscopy;  Laterality: N/A;    reports that he quit smoking about 17 years ago. His smoking use included Cigarettes. He has a 30.00 pack-year smoking history. He quit smokeless tobacco use about 16 years ago. He reports that he drinks alcohol.  He reports that he does not use drugs. family history includes Alcohol abuse in his father; Colon cancer (age of onset: 4) in his father; Coronary artery disease in his brother and mother; Diabetes in his mother; Diabetes type II in his mother; Hypertension in his mother. No Known Allergies    Review of Systems  Constitutional: Negative for chills and fever.  Respiratory: Negative for cough and shortness of breath.   Cardiovascular: Negative for chest pain.  Gastrointestinal: Negative for abdominal pain and vomiting.  Endocrine: Negative for polydipsia and polyuria.  Genitourinary: Negative for dysuria.       Objective:   Physical Exam  Constitutional: He appears well-developed and well-nourished.  Neck: Neck supple. No thyromegaly present.  Cardiovascular: Normal rate and regular rhythm.   Murmur heard. Pulmonary/Chest: Effort normal and breath sounds normal. No respiratory distress. He has no wheezes. He has no rales.  Musculoskeletal: He exhibits no edema.  Lymphadenopathy:    He has no cervical adenopathy.       Assessment:     #1 esophageal cancer with recent recurrence currently on chemotherapy every 2 weeks  #2 history of diabetes which has been very well controlled with his weight loss and currently off medications  #3 history of CAD  #4 hypothyroidism  #5 history of hypertension which is currently well controlled    Plan:     -Follow-up in 5 months and repeat lab work then including lipids and TSH and A1c -Continue close monitoring of blood sugars and these have been very stable -Reminder  for flu vaccine this fall.  Eulas Post MD [facility] at [facility]

## 2017-01-31 DIAGNOSIS — C160 Malignant neoplasm of cardia: Secondary | ICD-10-CM | POA: Diagnosis not present

## 2017-01-31 DIAGNOSIS — Z5111 Encounter for antineoplastic chemotherapy: Secondary | ICD-10-CM | POA: Diagnosis not present

## 2017-01-31 DIAGNOSIS — C159 Malignant neoplasm of esophagus, unspecified: Secondary | ICD-10-CM | POA: Diagnosis not present

## 2017-02-04 ENCOUNTER — Other Ambulatory Visit: Payer: Self-pay

## 2017-02-14 DIAGNOSIS — Z9221 Personal history of antineoplastic chemotherapy: Secondary | ICD-10-CM | POA: Diagnosis not present

## 2017-02-14 DIAGNOSIS — D696 Thrombocytopenia, unspecified: Secondary | ICD-10-CM | POA: Diagnosis not present

## 2017-02-14 DIAGNOSIS — C772 Secondary and unspecified malignant neoplasm of intra-abdominal lymph nodes: Secondary | ICD-10-CM | POA: Diagnosis not present

## 2017-02-14 DIAGNOSIS — Z9049 Acquired absence of other specified parts of digestive tract: Secondary | ICD-10-CM | POA: Diagnosis not present

## 2017-02-14 DIAGNOSIS — C155 Malignant neoplasm of lower third of esophagus: Secondary | ICD-10-CM | POA: Diagnosis not present

## 2017-02-14 DIAGNOSIS — Z87891 Personal history of nicotine dependence: Secondary | ICD-10-CM | POA: Diagnosis not present

## 2017-02-14 DIAGNOSIS — E876 Hypokalemia: Secondary | ICD-10-CM | POA: Diagnosis not present

## 2017-02-14 DIAGNOSIS — Z923 Personal history of irradiation: Secondary | ICD-10-CM | POA: Diagnosis not present

## 2017-02-14 DIAGNOSIS — C159 Malignant neoplasm of esophagus, unspecified: Secondary | ICD-10-CM | POA: Diagnosis not present

## 2017-02-21 DIAGNOSIS — Z9049 Acquired absence of other specified parts of digestive tract: Secondary | ICD-10-CM | POA: Diagnosis not present

## 2017-02-21 DIAGNOSIS — Z87891 Personal history of nicotine dependence: Secondary | ICD-10-CM | POA: Diagnosis not present

## 2017-02-21 DIAGNOSIS — C155 Malignant neoplasm of lower third of esophagus: Secondary | ICD-10-CM | POA: Diagnosis not present

## 2017-02-21 DIAGNOSIS — Z5111 Encounter for antineoplastic chemotherapy: Secondary | ICD-10-CM | POA: Diagnosis not present

## 2017-02-21 DIAGNOSIS — D696 Thrombocytopenia, unspecified: Secondary | ICD-10-CM | POA: Diagnosis not present

## 2017-02-21 DIAGNOSIS — C771 Secondary and unspecified malignant neoplasm of intrathoracic lymph nodes: Secondary | ICD-10-CM | POA: Diagnosis not present

## 2017-02-21 DIAGNOSIS — C159 Malignant neoplasm of esophagus, unspecified: Secondary | ICD-10-CM | POA: Diagnosis not present

## 2017-02-21 DIAGNOSIS — Z923 Personal history of irradiation: Secondary | ICD-10-CM | POA: Diagnosis not present

## 2017-03-05 ENCOUNTER — Encounter: Payer: Self-pay | Admitting: Family Medicine

## 2017-03-05 MED ORDER — ATORVASTATIN CALCIUM 80 MG PO TABS: 80.0000 mg | ORAL_TABLET | Freq: Every day | ORAL | 2 refills | Status: DC

## 2017-03-05 MED ORDER — METOPROLOL TARTRATE 50 MG PO TABS
50.0000 mg | ORAL_TABLET | Freq: Two times a day (BID) | ORAL | 2 refills | Status: DC
Start: 2017-03-05 — End: 2017-12-16

## 2017-03-07 DIAGNOSIS — Z87891 Personal history of nicotine dependence: Secondary | ICD-10-CM | POA: Diagnosis not present

## 2017-03-07 DIAGNOSIS — R918 Other nonspecific abnormal finding of lung field: Secondary | ICD-10-CM | POA: Diagnosis not present

## 2017-03-07 DIAGNOSIS — Z5111 Encounter for antineoplastic chemotherapy: Secondary | ICD-10-CM | POA: Diagnosis not present

## 2017-03-07 DIAGNOSIS — C155 Malignant neoplasm of lower third of esophagus: Secondary | ICD-10-CM | POA: Diagnosis not present

## 2017-03-07 DIAGNOSIS — C159 Malignant neoplasm of esophagus, unspecified: Secondary | ICD-10-CM | POA: Diagnosis not present

## 2017-03-07 DIAGNOSIS — R5383 Other fatigue: Secondary | ICD-10-CM | POA: Diagnosis not present

## 2017-03-19 ENCOUNTER — Ambulatory Visit (INDEPENDENT_AMBULATORY_CARE_PROVIDER_SITE_OTHER): Payer: Medicare Other | Admitting: *Deleted

## 2017-03-19 DIAGNOSIS — Z23 Encounter for immunization: Secondary | ICD-10-CM

## 2017-03-21 DIAGNOSIS — Z5111 Encounter for antineoplastic chemotherapy: Secondary | ICD-10-CM | POA: Diagnosis not present

## 2017-03-21 DIAGNOSIS — Z931 Gastrostomy status: Secondary | ICD-10-CM | POA: Diagnosis not present

## 2017-03-21 DIAGNOSIS — Z923 Personal history of irradiation: Secondary | ICD-10-CM | POA: Diagnosis not present

## 2017-03-21 DIAGNOSIS — C160 Malignant neoplasm of cardia: Secondary | ICD-10-CM | POA: Diagnosis not present

## 2017-03-21 DIAGNOSIS — C159 Malignant neoplasm of esophagus, unspecified: Secondary | ICD-10-CM | POA: Diagnosis not present

## 2017-03-21 DIAGNOSIS — C771 Secondary and unspecified malignant neoplasm of intrathoracic lymph nodes: Secondary | ICD-10-CM | POA: Diagnosis not present

## 2017-03-21 DIAGNOSIS — D696 Thrombocytopenia, unspecified: Secondary | ICD-10-CM | POA: Diagnosis not present

## 2017-03-21 DIAGNOSIS — Z9049 Acquired absence of other specified parts of digestive tract: Secondary | ICD-10-CM | POA: Diagnosis not present

## 2017-03-21 DIAGNOSIS — Z87891 Personal history of nicotine dependence: Secondary | ICD-10-CM | POA: Diagnosis not present

## 2017-03-21 DIAGNOSIS — G629 Polyneuropathy, unspecified: Secondary | ICD-10-CM | POA: Diagnosis not present

## 2017-03-21 DIAGNOSIS — R202 Paresthesia of skin: Secondary | ICD-10-CM | POA: Diagnosis not present

## 2017-03-21 DIAGNOSIS — R110 Nausea: Secondary | ICD-10-CM | POA: Diagnosis not present

## 2017-03-28 ENCOUNTER — Encounter: Payer: Self-pay | Admitting: Family Medicine

## 2017-04-04 DIAGNOSIS — Z9049 Acquired absence of other specified parts of digestive tract: Secondary | ICD-10-CM | POA: Diagnosis not present

## 2017-04-04 DIAGNOSIS — R110 Nausea: Secondary | ICD-10-CM | POA: Diagnosis not present

## 2017-04-04 DIAGNOSIS — Z87891 Personal history of nicotine dependence: Secondary | ICD-10-CM | POA: Diagnosis not present

## 2017-04-04 DIAGNOSIS — R5383 Other fatigue: Secondary | ICD-10-CM | POA: Diagnosis not present

## 2017-04-04 DIAGNOSIS — Z5111 Encounter for antineoplastic chemotherapy: Secondary | ICD-10-CM | POA: Diagnosis not present

## 2017-04-04 DIAGNOSIS — C159 Malignant neoplasm of esophagus, unspecified: Secondary | ICD-10-CM | POA: Diagnosis not present

## 2017-04-04 DIAGNOSIS — R600 Localized edema: Secondary | ICD-10-CM | POA: Diagnosis not present

## 2017-04-04 DIAGNOSIS — C155 Malignant neoplasm of lower third of esophagus: Secondary | ICD-10-CM | POA: Diagnosis not present

## 2017-04-18 DIAGNOSIS — Z923 Personal history of irradiation: Secondary | ICD-10-CM | POA: Diagnosis not present

## 2017-04-18 DIAGNOSIS — C160 Malignant neoplasm of cardia: Secondary | ICD-10-CM | POA: Diagnosis not present

## 2017-04-18 DIAGNOSIS — C771 Secondary and unspecified malignant neoplasm of intrathoracic lymph nodes: Secondary | ICD-10-CM | POA: Diagnosis not present

## 2017-04-18 DIAGNOSIS — Z5111 Encounter for antineoplastic chemotherapy: Secondary | ICD-10-CM | POA: Diagnosis not present

## 2017-04-18 DIAGNOSIS — Z9049 Acquired absence of other specified parts of digestive tract: Secondary | ICD-10-CM | POA: Diagnosis not present

## 2017-04-18 DIAGNOSIS — R110 Nausea: Secondary | ICD-10-CM | POA: Diagnosis not present

## 2017-04-18 DIAGNOSIS — Z87891 Personal history of nicotine dependence: Secondary | ICD-10-CM | POA: Diagnosis not present

## 2017-04-18 DIAGNOSIS — C159 Malignant neoplasm of esophagus, unspecified: Secondary | ICD-10-CM | POA: Diagnosis not present

## 2017-04-18 DIAGNOSIS — R5383 Other fatigue: Secondary | ICD-10-CM | POA: Diagnosis not present

## 2017-05-02 DIAGNOSIS — Z9049 Acquired absence of other specified parts of digestive tract: Secondary | ICD-10-CM | POA: Diagnosis not present

## 2017-05-02 DIAGNOSIS — Z903 Acquired absence of stomach [part of]: Secondary | ICD-10-CM | POA: Diagnosis not present

## 2017-05-02 DIAGNOSIS — R918 Other nonspecific abnormal finding of lung field: Secondary | ICD-10-CM | POA: Diagnosis not present

## 2017-05-02 DIAGNOSIS — C160 Malignant neoplasm of cardia: Secondary | ICD-10-CM | POA: Diagnosis not present

## 2017-05-02 DIAGNOSIS — Z87891 Personal history of nicotine dependence: Secondary | ICD-10-CM | POA: Diagnosis not present

## 2017-05-02 DIAGNOSIS — K5900 Constipation, unspecified: Secondary | ICD-10-CM | POA: Diagnosis not present

## 2017-05-02 DIAGNOSIS — C771 Secondary and unspecified malignant neoplasm of intrathoracic lymph nodes: Secondary | ICD-10-CM | POA: Diagnosis not present

## 2017-05-02 DIAGNOSIS — R0602 Shortness of breath: Secondary | ICD-10-CM | POA: Diagnosis not present

## 2017-05-02 DIAGNOSIS — C781 Secondary malignant neoplasm of mediastinum: Secondary | ICD-10-CM | POA: Diagnosis not present

## 2017-05-02 DIAGNOSIS — K089 Disorder of teeth and supporting structures, unspecified: Secondary | ICD-10-CM | POA: Diagnosis not present

## 2017-05-02 DIAGNOSIS — C159 Malignant neoplasm of esophagus, unspecified: Secondary | ICD-10-CM | POA: Diagnosis not present

## 2017-05-02 DIAGNOSIS — G629 Polyneuropathy, unspecified: Secondary | ICD-10-CM | POA: Diagnosis not present

## 2017-05-02 DIAGNOSIS — K219 Gastro-esophageal reflux disease without esophagitis: Secondary | ICD-10-CM | POA: Diagnosis not present

## 2017-05-15 DIAGNOSIS — C771 Secondary and unspecified malignant neoplasm of intrathoracic lymph nodes: Secondary | ICD-10-CM | POA: Diagnosis not present

## 2017-05-23 DIAGNOSIS — C771 Secondary and unspecified malignant neoplasm of intrathoracic lymph nodes: Secondary | ICD-10-CM | POA: Diagnosis not present

## 2017-06-03 DIAGNOSIS — C771 Secondary and unspecified malignant neoplasm of intrathoracic lymph nodes: Secondary | ICD-10-CM | POA: Diagnosis not present

## 2017-06-04 DIAGNOSIS — C771 Secondary and unspecified malignant neoplasm of intrathoracic lymph nodes: Secondary | ICD-10-CM | POA: Diagnosis not present

## 2017-06-05 DIAGNOSIS — C771 Secondary and unspecified malignant neoplasm of intrathoracic lymph nodes: Secondary | ICD-10-CM | POA: Diagnosis not present

## 2017-06-06 DIAGNOSIS — C771 Secondary and unspecified malignant neoplasm of intrathoracic lymph nodes: Secondary | ICD-10-CM | POA: Diagnosis not present

## 2017-06-07 DIAGNOSIS — C771 Secondary and unspecified malignant neoplasm of intrathoracic lymph nodes: Secondary | ICD-10-CM | POA: Diagnosis not present

## 2017-06-10 DIAGNOSIS — C159 Malignant neoplasm of esophagus, unspecified: Secondary | ICD-10-CM | POA: Diagnosis not present

## 2017-06-10 DIAGNOSIS — C771 Secondary and unspecified malignant neoplasm of intrathoracic lymph nodes: Secondary | ICD-10-CM | POA: Diagnosis not present

## 2017-06-11 DIAGNOSIS — C159 Malignant neoplasm of esophagus, unspecified: Secondary | ICD-10-CM | POA: Diagnosis not present

## 2017-06-11 DIAGNOSIS — C771 Secondary and unspecified malignant neoplasm of intrathoracic lymph nodes: Secondary | ICD-10-CM | POA: Diagnosis not present

## 2017-06-12 DIAGNOSIS — C159 Malignant neoplasm of esophagus, unspecified: Secondary | ICD-10-CM | POA: Diagnosis not present

## 2017-06-12 DIAGNOSIS — C771 Secondary and unspecified malignant neoplasm of intrathoracic lymph nodes: Secondary | ICD-10-CM | POA: Diagnosis not present

## 2017-06-13 DIAGNOSIS — C159 Malignant neoplasm of esophagus, unspecified: Secondary | ICD-10-CM | POA: Diagnosis not present

## 2017-06-13 DIAGNOSIS — C771 Secondary and unspecified malignant neoplasm of intrathoracic lymph nodes: Secondary | ICD-10-CM | POA: Diagnosis not present

## 2017-06-14 DIAGNOSIS — C159 Malignant neoplasm of esophagus, unspecified: Secondary | ICD-10-CM | POA: Diagnosis not present

## 2017-06-14 DIAGNOSIS — C771 Secondary and unspecified malignant neoplasm of intrathoracic lymph nodes: Secondary | ICD-10-CM | POA: Diagnosis not present

## 2017-06-18 ENCOUNTER — Ambulatory Visit: Payer: Medicare Other | Admitting: Family Medicine

## 2017-06-25 ENCOUNTER — Encounter: Payer: Self-pay | Admitting: Family Medicine

## 2017-06-25 ENCOUNTER — Ambulatory Visit (INDEPENDENT_AMBULATORY_CARE_PROVIDER_SITE_OTHER): Payer: Medicare Other | Admitting: Family Medicine

## 2017-06-25 VITALS — BP 110/60 | HR 86 | Temp 97.8°F | Ht 70.75 in | Wt 227.9 lb

## 2017-06-25 DIAGNOSIS — E785 Hyperlipidemia, unspecified: Secondary | ICD-10-CM | POA: Diagnosis not present

## 2017-06-25 DIAGNOSIS — E119 Type 2 diabetes mellitus without complications: Secondary | ICD-10-CM

## 2017-06-25 DIAGNOSIS — I10 Essential (primary) hypertension: Secondary | ICD-10-CM | POA: Diagnosis not present

## 2017-06-25 DIAGNOSIS — C159 Malignant neoplasm of esophagus, unspecified: Secondary | ICD-10-CM

## 2017-06-25 DIAGNOSIS — E038 Other specified hypothyroidism: Secondary | ICD-10-CM

## 2017-06-25 LAB — LIPID PANEL
CHOL/HDL RATIO: 3
Cholesterol: 106 mg/dL (ref 0–200)
HDL: 37 mg/dL — AB (ref >39.00)
LDL Cholesterol: 40 mg/dL (ref 0–99)
NONHDL: 68.87
Triglycerides: 144 mg/dL (ref 0.0–149.0)
VLDL: 28.8 mg/dL (ref 0.0–40.0)

## 2017-06-25 LAB — TSH: TSH: 0.85 u[IU]/mL (ref 0.35–4.50)

## 2017-06-25 LAB — HEMOGLOBIN A1C: Hgb A1c MFr Bld: 6 % (ref 4.6–6.5)

## 2017-06-25 NOTE — Progress Notes (Signed)
Subjective:     Patient ID: [name], male   DOB: Apr 10, 1941, 76 y.o.   MRN: [id number]  HPI Patient seen for medical follow-up. He has esophageal cancer with recurrence which required chemotherapy and recent radiation therapy. He finished chemotherapy around late October and just a week ago Friday finished his radiation therapy. He has not lost any weight and, in fact, his weight is up 4 pounds from last visit here. He has maintained excellent appetite. He has some occasional mucus but up in his throat but no major issues with dysphagia. He has follow-up scan in January through [facility] oncology  Chronic problems include history of dyslipidemia, CAD, type 2 diabetes, hypothyroidism. Medications reviewed.  Compliant with all. Does not monitor blood sugars regularly. Last A1c was 5.8% and this was off medications. He had lost substantial weight since last visit.  He has had flu vaccine and pneumonia vaccines are up-to-date  Past Medical History:  Diagnosis Date  . Aortic stenosis   . CAD, NATIVE VESSEL 04/13/2008  . DM 05/08/2010  . Esophageal cancer ([place])   . Gall bladder stones 07/20/2014  . 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 Hx of cardiovascular stress test    Lexiscan Myoview 4/16:  No ischemia, EF 52%  . HYPERLIPIDEMIA 05/08/2010  . HYPERTENSION, BENIGN 04/13/2008  . HYPOTHYROIDISM 05/08/2010  . Radiation 08/10/14-09/21/14   50.4 gray to esophagus   Past Surgical History:  Procedure Laterality Date  . CORONARY ARTERY BYPASS GRAFT  2002  . ESOPHAGOGASTRODUODENOSCOPY ENDOSCOPY  07/12/14  . EUS N/A 07/22/2014   Procedure: UPPER ENDOSCOPIC ULTRASOUND (EUS) LINEAR;  Surgeon: Milus Banister, MD;  [facility]: [facility];  Service: Endoscopy;  Laterality: N/A;    reports that he quit smoking about 17 years ago. His smoking use included cigarettes. He has a 30.00 pack-year smoking history. He quit smokeless tobacco use about 16 years ago. He  reports that he drinks alcohol. He reports that he does not use drugs. family history includes Alcohol abuse in his father; Colon cancer (age of onset: 39) in his father; Coronary artery disease in his brother and mother; Diabetes in his mother; Diabetes type II in his mother; Hypertension in his mother. No Known Allergies   Review of Systems  Constitutional: Negative for appetite change, chills, fever and unexpected weight change.  Respiratory: Negative for cough and shortness of breath.   Cardiovascular: Negative for chest pain.  Gastrointestinal: Negative for abdominal pain, nausea and vomiting.  Genitourinary: Negative for dysuria.       Objective:   Physical Exam  Constitutional: He is oriented to person, place, and time. He appears well-developed and well-nourished.  HENT:  Mouth/Throat: Oropharynx is clear and moist.  Neck: Neck supple.  Cardiovascular: Normal rate and regular rhythm.  Pulmonary/Chest: Effort normal and breath sounds normal. No respiratory distress. He has no wheezes. He has no rales.  Musculoskeletal: He exhibits no edema.  Neurological: He is alert and oriented to person, place, and time.       Assessment:     #1 esophageal cancer followed at [facility] with recent radiation therapy and repeat chemotherapy  #2 hypothyroidism  #3 history of CAD and hyperlipidemia  #4 history of type 2 diabetes-currently off medications for diabetes    Plan:     -Repeat labs today with hemoglobin A1c, lipid panel, TSH. We elected not to get any chemistries since he had these at [facility] late  October and these were reviewed [facility] plan routine follow-up in 6 months but sooner if indicated by labs above -Flu vaccine already given  Eulas Post MD [facility] at [facility]

## 2017-07-22 ENCOUNTER — Encounter: Payer: Self-pay | Admitting: Family Medicine

## 2017-07-22 MED ORDER — LEVOTHYROXINE SODIUM 175 MCG PO TABS: ORAL_TABLET | ORAL | 2 refills | Status: DC

## 2017-07-25 DIAGNOSIS — Z9221 Personal history of antineoplastic chemotherapy: Secondary | ICD-10-CM | POA: Diagnosis not present

## 2017-07-25 DIAGNOSIS — Z923 Personal history of irradiation: Secondary | ICD-10-CM | POA: Diagnosis not present

## 2017-07-25 DIAGNOSIS — C159 Malignant neoplasm of esophagus, unspecified: Secondary | ICD-10-CM | POA: Diagnosis not present

## 2017-07-25 DIAGNOSIS — Z9049 Acquired absence of other specified parts of digestive tract: Secondary | ICD-10-CM | POA: Diagnosis not present

## 2017-07-25 DIAGNOSIS — R918 Other nonspecific abnormal finding of lung field: Secondary | ICD-10-CM | POA: Diagnosis not present

## 2017-07-30 ENCOUNTER — Encounter: Payer: Self-pay | Admitting: Adult Health

## 2017-08-07 ENCOUNTER — Ambulatory Visit: Payer: Medicare Other | Admitting: Adult Health

## 2017-08-27 ENCOUNTER — Encounter: Payer: Self-pay | Admitting: Family Medicine

## 2017-08-27 DIAGNOSIS — H33312 Horseshoe tear of retina without detachment, left eye: Secondary | ICD-10-CM | POA: Diagnosis not present

## 2017-08-27 LAB — HM DIABETES EYE EXAM

## 2017-09-10 DIAGNOSIS — C159 Malignant neoplasm of esophagus, unspecified: Secondary | ICD-10-CM | POA: Diagnosis not present

## 2017-10-24 DIAGNOSIS — Z79899 Other long term (current) drug therapy: Secondary | ICD-10-CM | POA: Diagnosis not present

## 2017-10-24 DIAGNOSIS — Z87891 Personal history of nicotine dependence: Secondary | ICD-10-CM | POA: Diagnosis not present

## 2017-10-24 DIAGNOSIS — R918 Other nonspecific abnormal finding of lung field: Secondary | ICD-10-CM | POA: Diagnosis not present

## 2017-10-24 DIAGNOSIS — C159 Malignant neoplasm of esophagus, unspecified: Secondary | ICD-10-CM | POA: Diagnosis not present

## 2017-10-24 DIAGNOSIS — C155 Malignant neoplasm of lower third of esophagus: Secondary | ICD-10-CM | POA: Diagnosis not present

## 2017-10-24 DIAGNOSIS — J90 Pleural effusion, not elsewhere classified: Secondary | ICD-10-CM | POA: Diagnosis not present

## 2017-10-24 DIAGNOSIS — J700 Acute pulmonary manifestations due to radiation: Secondary | ICD-10-CM | POA: Diagnosis not present

## 2017-10-28 NOTE — Progress Notes (Signed)
HPI: FU CAD; hx of CAD status post CABG 4 in 2002, HTN, HL, aortic stenosis. He has undergone surgery, chemotherapy and radiation for esophageal cancer. Since last seen, the patient denies any dyspnea on exertion, orthopnea, PND, pedal edema, palpitations, syncope or chest pain.    Studies/Reports Reviewed Today:  Echo 9/16 - Normal LV systolic function, mild left ventricular hypertrophy, grade 1 diastolic dysfunction, Mild aortic stenosis with mean gradient 16 mmHg, trace aortic insufficiency and mild biatrial enlargement.  Myoview 4/16 LV Ejection Fraction: 52%. LV Wall Motion: NL LV Function; NL Wall Motion  Current Outpatient Medications  Medication Sig Dispense Refill  . aspirin EC 81 MG tablet Take 1 tablet (81 mg total) by mouth daily.    Marland Kitchen atorvastatin (LIPITOR) 80 MG tablet Take 1 tablet (80 mg total) by mouth daily. 90 tablet 2  . levothyroxine (SYNTHROID, LEVOTHROID) 175 MCG tablet TAKE ONE TABLET BY MOUTH ONCE DAILY BEFORE BREAKFAST 90 tablet 2  . metoprolol tartrate (LOPRESSOR) 50 MG tablet Take 1 tablet (50 mg total) by mouth 2 (two) times daily. 180 tablet 2  . Multiple Vitamin (MULTIVITAMIN) tablet Take 1 tablet by mouth daily.    Glory Rosebush VERIO test strip     . polyethylene glycol (MIRALAX / GLYCOLAX) packet Take 17 g by mouth daily as needed.     No current facility-administered medications for this visit.      Past Medical History:  Diagnosis Date  . Aortic stenosis   . CAD, NATIVE VESSEL 04/13/2008  . DM 05/08/2010  . Esophageal cancer ([place])   . Gall bladder stones 07/20/2014  . 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 Hx of cardiovascular stress test    Lexiscan Myoview 4/16:  No ischemia, EF 52%  . HYPERLIPIDEMIA 05/08/2010  . HYPERTENSION, BENIGN 04/13/2008  . HYPOTHYROIDISM 05/08/2010  . Radiation 08/10/14-09/21/14   50.4 gray to esophagus    Past Surgical History:  Procedure Laterality Date  .  CORONARY ARTERY BYPASS GRAFT  2002  . ESOPHAGOGASTRODUODENOSCOPY ENDOSCOPY  07/12/14  . EUS N/A 07/22/2014   Procedure: UPPER ENDOSCOPIC ULTRASOUND (EUS) LINEAR;  Surgeon: Milus Banister, MD;  Location: [facility];  Service: Endoscopy;  Laterality: N/A;    Social History   Socioeconomic History  . Marital status: Married    Spouse name: Not on file  . Number of children: 2  . Years of education: Not on file  . Highest education level: Not on file  Occupational History  . Occupation: Retired Software engineer  . Financial resource strain: Not on file  . Food insecurity:    Worry: Not on file    Inability: Not on file  . Transportation needs:    Medical: Not on file    Non-medical: Not on file  Tobacco Use  . Smoking status: Former Smoker    Packs/day: 1.00    Years: 30.00    Pack years: 30.00    Types: Cigarettes    Last attempt to quit: 07/30/1999    Years since quitting: 18.2  . Smokeless tobacco: Former Systems developer    Quit date: 07/18/2000  Substance and Sexual Activity  . Alcohol use: Yes    Alcohol/week: 0.0 oz    Comment: rare - used to drink daily before 2001  . Drug use: No  . Sexual activity: Not on file    Comment: regular exercise   Lifestyle  . Physical  activity:    Days per week: Not on file    Minutes per session: Not on file  . Stress: Not on file  Relationships  . Social connections:    Talks on phone: Not on file    Gets together: Not on file    Attends religious service: Not on file    Active member of club or organization: Not on file    Attends meetings of clubs or organizations: Not on file    Relationship status: Not on file  . Intimate partner violence:    Fear of current or ex partner: Not on file    Emotionally abused: Not on file    Physically abused: Not on file    Forced sexual activity: Not on file  Other Topics Concern  . Not on file  Social History Narrative   Married, wife [name]   #2 grown sons, Shanon Brow and Annie Main   Retired  English as a second language teacher   Lives in [place] being outdoors    Family History  Problem Relation Age of Onset  . Alcohol abuse Father   . Colon cancer Father 53  . Coronary artery disease Mother   . Diabetes type II Mother        diabetes type ll  . Hypertension Mother   . Diabetes Mother        type ll  . Coronary artery disease Brother   . Heart attack Neg Hx   . Stroke Neg Hx     ROS: no fevers or chills, productive cough, hemoptysis, dysphasia, odynophagia, melena, hematochezia, dysuria, hematuria, rash, seizure activity, orthopnea, PND, pedal edema, claudication. Remaining systems are negative.  Physical Exam: Well-developed well-nourished in no acute distress.  Skin is warm and dry.  HEENT is normal.  Neck is supple.  Chest is clear to auscultation with normal expansion.  Cardiovascular exam is regular rate and rhythm. 3/6 systolic murmur Abdominal exam nontender or distended. No masses palpated. Extremities show no edema. neuro grossly intact  ECG-sinus rhythm at a rate of 76.  Normal axis.  Anterior lateral and inferior T wave inversion.  No change compared to March 28, 2016.  Personally reviewed  A/P  1 coronary artery disease status post coronary artery bypass graft-no chest pain.  Continue medical therapy including aspirin and statin.  2 aortic stenosis-Murmur worse; repeat echocardiogram.  3 hypertension-blood pressure is controlled.  Continue present medications.  4 hyperlipidemia-continue statin.  Lipids and liver monitored by primary care.  5 esophageal cancer-previous resection.  Followed at [facility].  Kirk Ruths, MD

## 2017-11-01 ENCOUNTER — Ambulatory Visit (INDEPENDENT_AMBULATORY_CARE_PROVIDER_SITE_OTHER): Payer: Medicare Other | Admitting: Cardiology

## 2017-11-01 ENCOUNTER — Encounter: Payer: Self-pay | Admitting: Cardiology

## 2017-11-01 VITALS — BP 122/74 | HR 76 | Ht 71.0 in | Wt 234.8 lb

## 2017-11-01 DIAGNOSIS — I10 Essential (primary) hypertension: Secondary | ICD-10-CM

## 2017-11-01 DIAGNOSIS — E7800 Pure hypercholesterolemia, unspecified: Secondary | ICD-10-CM

## 2017-11-01 DIAGNOSIS — I359 Nonrheumatic aortic valve disorder, unspecified: Secondary | ICD-10-CM | POA: Diagnosis not present

## 2017-11-01 DIAGNOSIS — I2510 Atherosclerotic heart disease of native coronary artery without angina pectoris: Secondary | ICD-10-CM | POA: Diagnosis not present

## 2017-11-01 NOTE — Patient Instructions (Signed)

## 2017-11-13 ENCOUNTER — Other Ambulatory Visit: Payer: Self-pay

## 2017-11-13 ENCOUNTER — Ambulatory Visit (HOSPITAL_COMMUNITY): Payer: Medicare Other | Attending: Cardiology

## 2017-11-13 DIAGNOSIS — R42 Dizziness and giddiness: Secondary | ICD-10-CM | POA: Diagnosis not present

## 2017-11-13 DIAGNOSIS — I359 Nonrheumatic aortic valve disorder, unspecified: Secondary | ICD-10-CM | POA: Diagnosis not present

## 2017-11-13 DIAGNOSIS — I119 Hypertensive heart disease without heart failure: Secondary | ICD-10-CM | POA: Insufficient documentation

## 2017-11-13 DIAGNOSIS — I2510 Atherosclerotic heart disease of native coronary artery without angina pectoris: Secondary | ICD-10-CM | POA: Insufficient documentation

## 2017-11-13 DIAGNOSIS — E1151 Type 2 diabetes mellitus with diabetic peripheral angiopathy without gangrene: Secondary | ICD-10-CM | POA: Insufficient documentation

## 2017-11-13 DIAGNOSIS — I350 Nonrheumatic aortic (valve) stenosis: Secondary | ICD-10-CM | POA: Insufficient documentation

## 2017-11-13 DIAGNOSIS — I4891 Unspecified atrial fibrillation: Secondary | ICD-10-CM | POA: Diagnosis not present

## 2017-12-05 ENCOUNTER — Encounter: Payer: Self-pay | Admitting: Family Medicine

## 2017-12-05 MED ORDER — ATORVASTATIN CALCIUM 80 MG PO TABS: 80.0000 mg | ORAL_TABLET | Freq: Every day | ORAL | 1 refills | Status: DC

## 2017-12-16 ENCOUNTER — Encounter: Payer: Self-pay | Admitting: Family Medicine

## 2017-12-16 ENCOUNTER — Other Ambulatory Visit: Payer: Self-pay | Admitting: Family Medicine

## 2017-12-16 MED ORDER — METOPROLOL TARTRATE 50 MG PO TABS: 50.0000 mg | ORAL_TABLET | Freq: Two times a day (BID) | ORAL | 1 refills | Status: DC

## 2017-12-30 ENCOUNTER — Ambulatory Visit (INDEPENDENT_AMBULATORY_CARE_PROVIDER_SITE_OTHER): Payer: Medicare Other | Admitting: Family Medicine

## 2017-12-30 ENCOUNTER — Encounter: Payer: Self-pay | Admitting: Family Medicine

## 2017-12-30 VITALS — BP 120/70 | HR 72 | Temp 98.1°F | Wt 232.6 lb

## 2017-12-30 DIAGNOSIS — E785 Hyperlipidemia, unspecified: Secondary | ICD-10-CM

## 2017-12-30 DIAGNOSIS — E7800 Pure hypercholesterolemia, unspecified: Secondary | ICD-10-CM

## 2017-12-30 DIAGNOSIS — E1169 Type 2 diabetes mellitus with other specified complication: Secondary | ICD-10-CM | POA: Diagnosis not present

## 2017-12-30 DIAGNOSIS — I10 Essential (primary) hypertension: Secondary | ICD-10-CM

## 2017-12-30 DIAGNOSIS — I2510 Atherosclerotic heart disease of native coronary artery without angina pectoris: Secondary | ICD-10-CM

## 2017-12-30 DIAGNOSIS — E119 Type 2 diabetes mellitus without complications: Secondary | ICD-10-CM | POA: Diagnosis not present

## 2017-12-30 LAB — POCT GLYCOSYLATED HEMOGLOBIN (HGB A1C): HEMOGLOBIN A1C: 5.7 % — AB (ref 4.0–5.6)

## 2017-12-30 NOTE — Progress Notes (Signed)
Subjective:     Patient ID: [name], male   DOB: 11/12/1940, 77 y.o.   MRN: [id number]  HPI Patient here for follow-up type 2 diabetes  History of esophagus cancer. He lost some weight following his initial treatment and has done relatively good job maintaining that though weight is up 5 pounds from last visit. Not monitoring sugars regular. No polyuria or polydipsia. Last A1c 6.0%.  Hypothyroidism on replacement. TSH was at goal last visit. Compliant with therapy. Hyperlipidemia treated with atorvastatin. No recent chest pains. Had recent echocardiogram per cardiology with mild aortic stenosis and no significant progression.  Regarding esophagus cancer he had CT scans in January and April and both were stable with no signs of recurrence. He is getting every three-month follow-up at [facility] for that  Appetite is good. Generally feels well. No swallowing difficulties.  Past Medical History:  Diagnosis Date  . Aortic stenosis   . CAD, NATIVE VESSEL 04/13/2008  . DM 05/08/2010  . Esophageal cancer ([place])   . Gall bladder stones 07/20/2014  . 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 Hx of cardiovascular stress test    Lexiscan Myoview 4/16:  No ischemia, EF 52%  . HYPERLIPIDEMIA 05/08/2010  . HYPERTENSION, BENIGN 04/13/2008  . HYPOTHYROIDISM 05/08/2010  . Radiation 08/10/14-09/21/14   50.4 gray to esophagus   Past Surgical History:  Procedure Laterality Date  . CORONARY ARTERY BYPASS GRAFT  2002  . ESOPHAGOGASTRODUODENOSCOPY ENDOSCOPY  07/12/14  . EUS N/A 07/22/2014   Procedure: UPPER ENDOSCOPIC ULTRASOUND (EUS) LINEAR;  Surgeon: Milus Banister, MD;  Location: [facility];  Service: Endoscopy;  Laterality: N/A;    reports that he quit smoking about 18 years ago. His smoking use included cigarettes. He has a 30.00 pack-year smoking history. He quit smokeless tobacco use about 17 years ago. He reports that he drinks alcohol. He reports that he  does not use drugs. family history includes Alcohol abuse in his father; Colon cancer (age of onset: 49) in his father; Coronary artery disease in his brother and mother; Diabetes in his mother; Diabetes type II in his mother; Hypertension in his mother. No Known Allergies   Review of Systems  Constitutional: Negative for fatigue and unexpected weight change.  HENT: Negative for trouble swallowing.   Eyes: Negative for visual disturbance.  Respiratory: Negative for cough, chest tightness and shortness of breath.   Cardiovascular: Negative for chest pain, palpitations and leg swelling.  Endocrine: Negative for polydipsia and polyuria.  Neurological: Negative for dizziness, syncope, weakness, light-headedness and headaches.       Objective:   Physical Exam  Constitutional: He is oriented to person, place, and time. He appears well-developed and well-nourished.  HENT:  Right Ear: External ear normal.  Left Ear: External ear normal.  Mouth/Throat: Oropharynx is clear and moist.  Eyes: Pupils are equal, round, and reactive to light.  Neck: Neck supple. No thyromegaly present.  Cardiovascular: Normal rate and regular rhythm.  Murmur heard. Pulmonary/Chest: Effort normal and breath sounds normal. No respiratory distress. He has no wheezes. He has no rales.  Musculoskeletal: He exhibits no edema.  Neurological: He is alert and oriented to person, place, and time.       Assessment:     #1 type 2 diabetes well controlled with A1c today 5.7%  #2 history of esophageal cancer with previous radiation therapy, surgery, and chemotherapy  #3 hypothyroidism on replacement  #4 dyslipidemia on  Lipitor    Plan:     -Continue current medications. We'll plan routine follow-up in 6 months and repeat other labs at that point including A1c, lipids, hepatic, and TSH -He is encouraged to watch again steady weight gain as his weight is up 5 pounds from last visit  Eulas Post MD [place]  [facility] at [facility]

## 2018-01-06 DIAGNOSIS — C159 Malignant neoplasm of esophagus, unspecified: Secondary | ICD-10-CM | POA: Diagnosis not present

## 2018-02-06 DIAGNOSIS — C159 Malignant neoplasm of esophagus, unspecified: Secondary | ICD-10-CM | POA: Diagnosis not present

## 2018-02-06 DIAGNOSIS — Z923 Personal history of irradiation: Secondary | ICD-10-CM | POA: Diagnosis not present

## 2018-02-06 DIAGNOSIS — Z9221 Personal history of antineoplastic chemotherapy: Secondary | ICD-10-CM | POA: Diagnosis not present

## 2018-02-06 DIAGNOSIS — C771 Secondary and unspecified malignant neoplasm of intrathoracic lymph nodes: Secondary | ICD-10-CM | POA: Diagnosis not present

## 2018-02-06 DIAGNOSIS — Z8501 Personal history of malignant neoplasm of esophagus: Secondary | ICD-10-CM | POA: Diagnosis not present

## 2018-02-06 DIAGNOSIS — R918 Other nonspecific abnormal finding of lung field: Secondary | ICD-10-CM | POA: Diagnosis not present

## 2018-02-06 DIAGNOSIS — Z08 Encounter for follow-up examination after completed treatment for malignant neoplasm: Secondary | ICD-10-CM | POA: Diagnosis not present

## 2018-02-06 DIAGNOSIS — R911 Solitary pulmonary nodule: Secondary | ICD-10-CM | POA: Diagnosis not present

## 2018-03-26 DIAGNOSIS — C159 Malignant neoplasm of esophagus, unspecified: Secondary | ICD-10-CM | POA: Diagnosis not present

## 2018-04-09 ENCOUNTER — Ambulatory Visit (INDEPENDENT_AMBULATORY_CARE_PROVIDER_SITE_OTHER): Payer: Medicare Other | Admitting: *Deleted

## 2018-04-09 DIAGNOSIS — Z23 Encounter for immunization: Secondary | ICD-10-CM | POA: Diagnosis not present

## 2018-04-15 ENCOUNTER — Encounter: Payer: Self-pay | Admitting: Family Medicine

## 2018-04-15 ENCOUNTER — Other Ambulatory Visit: Payer: Self-pay

## 2018-04-15 MED ORDER — LEVOTHYROXINE SODIUM 175 MCG PO TABS: ORAL_TABLET | ORAL | 2 refills | Status: DC

## 2018-04-24 DIAGNOSIS — G629 Polyneuropathy, unspecified: Secondary | ICD-10-CM | POA: Diagnosis not present

## 2018-04-24 DIAGNOSIS — J90 Pleural effusion, not elsewhere classified: Secondary | ICD-10-CM | POA: Diagnosis not present

## 2018-04-24 DIAGNOSIS — R918 Other nonspecific abnormal finding of lung field: Secondary | ICD-10-CM | POA: Diagnosis not present

## 2018-04-24 DIAGNOSIS — Z7982 Long term (current) use of aspirin: Secondary | ICD-10-CM | POA: Diagnosis not present

## 2018-04-24 DIAGNOSIS — C155 Malignant neoplasm of lower third of esophagus: Secondary | ICD-10-CM | POA: Diagnosis not present

## 2018-04-24 DIAGNOSIS — C159 Malignant neoplasm of esophagus, unspecified: Secondary | ICD-10-CM | POA: Diagnosis not present

## 2018-04-24 DIAGNOSIS — Z79899 Other long term (current) drug therapy: Secondary | ICD-10-CM | POA: Diagnosis not present

## 2018-04-24 DIAGNOSIS — Z87891 Personal history of nicotine dependence: Secondary | ICD-10-CM | POA: Diagnosis not present

## 2018-04-24 DIAGNOSIS — D649 Anemia, unspecified: Secondary | ICD-10-CM | POA: Diagnosis not present

## 2018-04-24 LAB — HEPATIC FUNCTION PANEL
ALT: 21 (ref 10–40)
AST: 34 (ref 14–40)
Alkaline Phosphatase: 71 (ref 25–125)
BILIRUBIN, TOTAL: 1

## 2018-04-24 LAB — BASIC METABOLIC PANEL
BUN: 16 (ref 4–21)
Creatinine: 1.1 (ref <=1.3)
GLUCOSE: 114
POTASSIUM: 4.2 (ref 3.4–5.3)
SODIUM: 142 (ref 137–147)

## 2018-05-27 ENCOUNTER — Other Ambulatory Visit: Payer: Self-pay

## 2018-05-27 ENCOUNTER — Encounter: Payer: Self-pay | Admitting: Family Medicine

## 2018-05-27 MED ORDER — ATORVASTATIN CALCIUM 80 MG PO TABS: 80.0000 mg | ORAL_TABLET | Freq: Every day | ORAL | 1 refills | Status: DC

## 2018-06-10 ENCOUNTER — Other Ambulatory Visit: Payer: Self-pay

## 2018-06-10 ENCOUNTER — Encounter: Payer: Self-pay | Admitting: Family Medicine

## 2018-06-10 MED ORDER — METOPROLOL TARTRATE 50 MG PO TABS: 50.0000 mg | ORAL_TABLET | Freq: Two times a day (BID) | ORAL | 1 refills | Status: DC

## 2018-07-14 ENCOUNTER — Encounter: Payer: Self-pay | Admitting: Family Medicine

## 2018-07-14 ENCOUNTER — Other Ambulatory Visit: Payer: Self-pay

## 2018-07-14 ENCOUNTER — Ambulatory Visit (INDEPENDENT_AMBULATORY_CARE_PROVIDER_SITE_OTHER): Payer: Medicare Other | Admitting: Family Medicine

## 2018-07-14 VITALS — BP 110/64 | HR 69 | Temp 97.8°F | Ht 71.0 in | Wt 237.1 lb

## 2018-07-14 DIAGNOSIS — I10 Essential (primary) hypertension: Secondary | ICD-10-CM

## 2018-07-14 DIAGNOSIS — E785 Hyperlipidemia, unspecified: Secondary | ICD-10-CM | POA: Diagnosis not present

## 2018-07-14 DIAGNOSIS — D696 Thrombocytopenia, unspecified: Secondary | ICD-10-CM

## 2018-07-14 DIAGNOSIS — E038 Other specified hypothyroidism: Secondary | ICD-10-CM

## 2018-07-14 DIAGNOSIS — E1169 Type 2 diabetes mellitus with other specified complication: Secondary | ICD-10-CM | POA: Diagnosis not present

## 2018-07-14 DIAGNOSIS — E7800 Pure hypercholesterolemia, unspecified: Secondary | ICD-10-CM

## 2018-07-14 LAB — LIPID PANEL
CHOL/HDL RATIO: 3
Cholesterol: 102 mg/dL (ref 0–200)
HDL: 32.3 mg/dL — ABNORMAL LOW (ref >39.00)
LDL CALC: 46 mg/dL (ref 0–99)
NONHDL: 69.77
TRIGLYCERIDES: 117 mg/dL (ref 0.0–149.0)
VLDL: 23.4 mg/dL (ref 0.0–40.0)

## 2018-07-14 LAB — POCT GLYCOSYLATED HEMOGLOBIN (HGB A1C): Hemoglobin A1C: 5.6 % (ref 4.0–5.6)

## 2018-07-14 LAB — TSH: TSH: 2.2 u[IU]/mL (ref 0.35–4.50)

## 2018-07-14 LAB — VITAMIN B12: VITAMIN B 12: 429 pg/mL (ref 211–911)

## 2018-07-14 NOTE — Progress Notes (Signed)
Subjective:     Patient ID: [name], male   DOB: [birth year], 78 y.o.   MRN: [id number]  HPI Patient here for medical follow-up.  His chronic problems include history of esophageal cancer, CAD, hypertension, aortic stenosis, hypothyroidism, type 2 diabetes, psoriasis.  He is followed closely at [facility] and had scans done this past fall which showed no evidence for recurrent cancer.  He continues to have some reflux symptoms.  He had lab work done and platelet count was low at 109,000.  He had some chronic thrombocytopenia.  There was suggestion to check B12 level but this was not done.  He has hypothyroidism on replacement.  Remains on Lipitor 80 mg daily.  No myalgias.  Not monitoring blood sugars regularly.  He has had some recent weight gain.  No formal exercise but stays very active at home.  No recent chest pains.  Past Medical History:  Diagnosis Date  . Aortic stenosis   . CAD, NATIVE VESSEL 04/13/2008  . DM 05/08/2010  . Esophageal cancer ([place])   . Gall bladder stones 07/20/2014  . 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 Hx of cardiovascular stress test    Lexiscan Myoview 4/16:  No ischemia, EF 52%  . HYPERLIPIDEMIA 05/08/2010  . HYPERTENSION, BENIGN 04/13/2008  . HYPOTHYROIDISM 05/08/2010  . Radiation 08/10/14-09/21/14   50.4 gray to esophagus   Past Surgical History:  Procedure Laterality Date  . CORONARY ARTERY BYPASS GRAFT  2002  . ESOPHAGOGASTRODUODENOSCOPY ENDOSCOPY  07/12/14  . EUS N/A 07/22/2014   Procedure: UPPER ENDOSCOPIC ULTRASOUND (EUS) LINEAR;  Surgeon: Milus Banister, MD;  Location: [facility];  Service: Endoscopy;  Laterality: N/A;    reports that he quit smoking about 18 years ago. His smoking use included cigarettes. He has a 30.00 pack-year smoking history. He quit smokeless tobacco use about 18 years ago. He reports current alcohol use. He reports that he does not use drugs. family history includes Alcohol abuse in  his father; Colon cancer (age of onset: 80) in his father; Coronary artery disease in his brother and mother; Diabetes in his mother; Diabetes type II in his mother; Hypertension in his mother. No Known Allergies   Review of Systems  Constitutional: Negative for appetite change, fatigue and unexpected weight change.  Eyes: Negative for visual disturbance.  Respiratory: Negative for cough, chest tightness and shortness of breath.   Cardiovascular: Negative for chest pain, palpitations and leg swelling.  Gastrointestinal: Negative for abdominal pain.  Endocrine: Negative for polydipsia and polyuria.  Genitourinary: Negative for dysuria.  Neurological: Negative for dizziness, syncope, weakness, light-headedness and headaches.       Objective:   Physical Exam Constitutional:      Appearance: He is well-developed.  Eyes:     Pupils: Pupils are equal, round, and reactive to light.  Neck:     Musculoskeletal: Neck supple.     Thyroid: No thyromegaly.  Cardiovascular:     Rate and Rhythm: Normal rate and regular rhythm.     Heart sounds: Murmur present.     Comments: He has soft 2/6 systolic murmur noted at the right upper sternal border and left sternal border Pulmonary:     Effort: Pulmonary effort is normal. No respiratory distress.     Breath sounds: Normal breath sounds. No wheezing or rales.  Musculoskeletal:     Right lower leg: No edema.     Left lower leg: No edema.  Neurological:     Mental Status: He is alert and oriented to person, place, and time.        Assessment:     #1 type 2 diabetes.  Well-controlled with A1c 5.6%  #2 hypothyroidism  #3 hyperlipidemia.  He has history of CAD.  Goal LDL less than 70  #4 chronic thrombocytopenia-etiology unclear  #5 esophageal cancer history    Plan:     -Check further labs with B12 level (thrombocytopenia), TSH, lipid panel.  We did not check hepatic panel since this was just done through oncology  -We suggested some  gradual reduction in calories and try to lose a few pounds.  -Routine follow-up in 6 months and sooner as needed  Eulas Post MD [place] [facility] at [facility]

## 2018-07-15 ENCOUNTER — Encounter: Payer: Self-pay | Admitting: Family Medicine

## 2018-07-15 DIAGNOSIS — C159 Malignant neoplasm of esophagus, unspecified: Secondary | ICD-10-CM | POA: Diagnosis not present

## 2018-07-16 ENCOUNTER — Other Ambulatory Visit: Payer: Self-pay

## 2018-07-16 MED ORDER — LEVOTHYROXINE SODIUM 175 MCG PO TABS: ORAL_TABLET | ORAL | 2 refills | Status: DC

## 2018-08-14 DIAGNOSIS — Z9049 Acquired absence of other specified parts of digestive tract: Secondary | ICD-10-CM | POA: Diagnosis not present

## 2018-08-14 DIAGNOSIS — Z923 Personal history of irradiation: Secondary | ICD-10-CM | POA: Diagnosis not present

## 2018-08-14 DIAGNOSIS — C771 Secondary and unspecified malignant neoplasm of intrathoracic lymph nodes: Secondary | ICD-10-CM | POA: Diagnosis not present

## 2018-08-14 DIAGNOSIS — Z903 Acquired absence of stomach [part of]: Secondary | ICD-10-CM | POA: Diagnosis not present

## 2018-08-14 DIAGNOSIS — C159 Malignant neoplasm of esophagus, unspecified: Secondary | ICD-10-CM | POA: Diagnosis not present

## 2018-08-14 DIAGNOSIS — C155 Malignant neoplasm of lower third of esophagus: Secondary | ICD-10-CM | POA: Diagnosis not present

## 2018-08-14 DIAGNOSIS — R918 Other nonspecific abnormal finding of lung field: Secondary | ICD-10-CM | POA: Diagnosis not present

## 2018-08-27 ENCOUNTER — Encounter: Payer: Self-pay | Admitting: Family Medicine

## 2018-08-27 ENCOUNTER — Other Ambulatory Visit: Payer: Self-pay

## 2018-08-27 MED ORDER — ATORVASTATIN CALCIUM 80 MG PO TABS: 80.0000 mg | ORAL_TABLET | Freq: Every day | ORAL | 3 refills | Status: DC

## 2018-09-15 ENCOUNTER — Encounter: Payer: Self-pay | Admitting: Family Medicine

## 2018-09-15 ENCOUNTER — Other Ambulatory Visit: Payer: Self-pay

## 2018-09-15 MED ORDER — METOPROLOL TARTRATE 50 MG PO TABS: 50.0000 mg | ORAL_TABLET | Freq: Two times a day (BID) | ORAL | 1 refills | Status: DC

## 2018-10-14 DIAGNOSIS — C159 Malignant neoplasm of esophagus, unspecified: Secondary | ICD-10-CM | POA: Diagnosis not present

## 2018-11-27 DIAGNOSIS — C159 Malignant neoplasm of esophagus, unspecified: Secondary | ICD-10-CM | POA: Diagnosis not present

## 2018-11-27 DIAGNOSIS — J90 Pleural effusion, not elsewhere classified: Secondary | ICD-10-CM | POA: Diagnosis not present

## 2018-11-27 DIAGNOSIS — C779 Secondary and unspecified malignant neoplasm of lymph node, unspecified: Secondary | ICD-10-CM | POA: Diagnosis not present

## 2018-11-27 DIAGNOSIS — Z87891 Personal history of nicotine dependence: Secondary | ICD-10-CM | POA: Diagnosis not present

## 2018-11-27 DIAGNOSIS — R918 Other nonspecific abnormal finding of lung field: Secondary | ICD-10-CM | POA: Diagnosis not present

## 2018-11-27 DIAGNOSIS — Z9049 Acquired absence of other specified parts of digestive tract: Secondary | ICD-10-CM | POA: Diagnosis not present

## 2018-11-27 DIAGNOSIS — C155 Malignant neoplasm of lower third of esophagus: Secondary | ICD-10-CM | POA: Diagnosis not present

## 2018-11-27 DIAGNOSIS — Z923 Personal history of irradiation: Secondary | ICD-10-CM | POA: Diagnosis not present

## 2018-12-15 DIAGNOSIS — C159 Malignant neoplasm of esophagus, unspecified: Secondary | ICD-10-CM | POA: Diagnosis not present

## 2018-12-15 DIAGNOSIS — Z5111 Encounter for antineoplastic chemotherapy: Secondary | ICD-10-CM | POA: Diagnosis not present

## 2018-12-29 DIAGNOSIS — R110 Nausea: Secondary | ICD-10-CM | POA: Diagnosis not present

## 2018-12-29 DIAGNOSIS — Z6831 Body mass index (BMI) 31.0-31.9, adult: Secondary | ICD-10-CM | POA: Diagnosis not present

## 2018-12-29 DIAGNOSIS — Z9049 Acquired absence of other specified parts of digestive tract: Secondary | ICD-10-CM | POA: Diagnosis not present

## 2018-12-29 DIAGNOSIS — Z5111 Encounter for antineoplastic chemotherapy: Secondary | ICD-10-CM | POA: Diagnosis not present

## 2018-12-29 DIAGNOSIS — C155 Malignant neoplasm of lower third of esophagus: Secondary | ICD-10-CM | POA: Diagnosis not present

## 2018-12-29 DIAGNOSIS — R5383 Other fatigue: Secondary | ICD-10-CM | POA: Diagnosis not present

## 2018-12-29 DIAGNOSIS — Z87891 Personal history of nicotine dependence: Secondary | ICD-10-CM | POA: Diagnosis not present

## 2018-12-29 DIAGNOSIS — Z923 Personal history of irradiation: Secondary | ICD-10-CM | POA: Diagnosis not present

## 2018-12-29 DIAGNOSIS — C159 Malignant neoplasm of esophagus, unspecified: Secondary | ICD-10-CM | POA: Diagnosis not present

## 2018-12-29 DIAGNOSIS — R634 Abnormal weight loss: Secondary | ICD-10-CM | POA: Diagnosis not present

## 2018-12-29 DIAGNOSIS — C771 Secondary and unspecified malignant neoplasm of intrathoracic lymph nodes: Secondary | ICD-10-CM | POA: Diagnosis not present

## 2019-01-12 ENCOUNTER — Ambulatory Visit: Payer: Medicare Other | Admitting: Family Medicine

## 2019-01-12 DIAGNOSIS — Z87891 Personal history of nicotine dependence: Secondary | ICD-10-CM | POA: Diagnosis not present

## 2019-01-12 DIAGNOSIS — C159 Malignant neoplasm of esophagus, unspecified: Secondary | ICD-10-CM | POA: Diagnosis not present

## 2019-01-12 DIAGNOSIS — R110 Nausea: Secondary | ICD-10-CM | POA: Diagnosis not present

## 2019-01-12 DIAGNOSIS — Z5111 Encounter for antineoplastic chemotherapy: Secondary | ICD-10-CM | POA: Diagnosis not present

## 2019-01-12 DIAGNOSIS — R5383 Other fatigue: Secondary | ICD-10-CM | POA: Diagnosis not present

## 2019-01-12 DIAGNOSIS — K30 Functional dyspepsia: Secondary | ICD-10-CM | POA: Diagnosis not present

## 2019-01-12 DIAGNOSIS — Z9049 Acquired absence of other specified parts of digestive tract: Secondary | ICD-10-CM | POA: Diagnosis not present

## 2019-01-12 DIAGNOSIS — C155 Malignant neoplasm of lower third of esophagus: Secondary | ICD-10-CM | POA: Diagnosis not present

## 2019-01-12 DIAGNOSIS — R1310 Dysphagia, unspecified: Secondary | ICD-10-CM | POA: Diagnosis not present

## 2019-01-26 DIAGNOSIS — C159 Malignant neoplasm of esophagus, unspecified: Secondary | ICD-10-CM | POA: Diagnosis not present

## 2019-01-26 DIAGNOSIS — Z87891 Personal history of nicotine dependence: Secondary | ICD-10-CM | POA: Diagnosis not present

## 2019-01-26 DIAGNOSIS — K5903 Drug induced constipation: Secondary | ICD-10-CM | POA: Diagnosis not present

## 2019-01-26 DIAGNOSIS — R5383 Other fatigue: Secondary | ICD-10-CM | POA: Diagnosis not present

## 2019-01-26 DIAGNOSIS — C155 Malignant neoplasm of lower third of esophagus: Secondary | ICD-10-CM | POA: Diagnosis not present

## 2019-01-26 DIAGNOSIS — Z923 Personal history of irradiation: Secondary | ICD-10-CM | POA: Diagnosis not present

## 2019-01-26 DIAGNOSIS — G629 Polyneuropathy, unspecified: Secondary | ICD-10-CM | POA: Diagnosis not present

## 2019-01-26 DIAGNOSIS — D696 Thrombocytopenia, unspecified: Secondary | ICD-10-CM | POA: Diagnosis not present

## 2019-01-26 DIAGNOSIS — Z9049 Acquired absence of other specified parts of digestive tract: Secondary | ICD-10-CM | POA: Diagnosis not present

## 2019-01-26 DIAGNOSIS — Z5111 Encounter for antineoplastic chemotherapy: Secondary | ICD-10-CM | POA: Diagnosis not present

## 2019-02-06 ENCOUNTER — Other Ambulatory Visit: Payer: Self-pay

## 2019-02-09 DIAGNOSIS — R5383 Other fatigue: Secondary | ICD-10-CM | POA: Diagnosis not present

## 2019-02-09 DIAGNOSIS — Z87891 Personal history of nicotine dependence: Secondary | ICD-10-CM | POA: Diagnosis not present

## 2019-02-09 DIAGNOSIS — C159 Malignant neoplasm of esophagus, unspecified: Secondary | ICD-10-CM | POA: Diagnosis not present

## 2019-02-09 DIAGNOSIS — K5900 Constipation, unspecified: Secondary | ICD-10-CM | POA: Diagnosis not present

## 2019-02-09 DIAGNOSIS — C155 Malignant neoplasm of lower third of esophagus: Secondary | ICD-10-CM | POA: Diagnosis not present

## 2019-02-09 DIAGNOSIS — R630 Anorexia: Secondary | ICD-10-CM | POA: Diagnosis not present

## 2019-02-09 DIAGNOSIS — Z5111 Encounter for antineoplastic chemotherapy: Secondary | ICD-10-CM | POA: Diagnosis not present

## 2019-02-09 DIAGNOSIS — Z9049 Acquired absence of other specified parts of digestive tract: Secondary | ICD-10-CM | POA: Diagnosis not present

## 2019-02-09 DIAGNOSIS — Z923 Personal history of irradiation: Secondary | ICD-10-CM | POA: Diagnosis not present

## 2019-02-23 DIAGNOSIS — Z5111 Encounter for antineoplastic chemotherapy: Secondary | ICD-10-CM | POA: Diagnosis not present

## 2019-02-23 DIAGNOSIS — C159 Malignant neoplasm of esophagus, unspecified: Secondary | ICD-10-CM | POA: Diagnosis not present

## 2019-02-23 DIAGNOSIS — Z87891 Personal history of nicotine dependence: Secondary | ICD-10-CM | POA: Diagnosis not present

## 2019-02-23 DIAGNOSIS — R110 Nausea: Secondary | ICD-10-CM | POA: Diagnosis not present

## 2019-02-23 DIAGNOSIS — C771 Secondary and unspecified malignant neoplasm of intrathoracic lymph nodes: Secondary | ICD-10-CM | POA: Diagnosis not present

## 2019-02-23 DIAGNOSIS — C155 Malignant neoplasm of lower third of esophagus: Secondary | ICD-10-CM | POA: Diagnosis not present

## 2019-02-23 DIAGNOSIS — Z923 Personal history of irradiation: Secondary | ICD-10-CM | POA: Diagnosis not present

## 2019-02-23 DIAGNOSIS — Z9049 Acquired absence of other specified parts of digestive tract: Secondary | ICD-10-CM | POA: Diagnosis not present

## 2019-03-04 ENCOUNTER — Other Ambulatory Visit: Payer: Self-pay | Admitting: Family Medicine

## 2019-03-09 DIAGNOSIS — C155 Malignant neoplasm of lower third of esophagus: Secondary | ICD-10-CM | POA: Diagnosis not present

## 2019-03-09 DIAGNOSIS — Z87891 Personal history of nicotine dependence: Secondary | ICD-10-CM | POA: Diagnosis not present

## 2019-03-09 DIAGNOSIS — R590 Localized enlarged lymph nodes: Secondary | ICD-10-CM | POA: Diagnosis not present

## 2019-03-09 DIAGNOSIS — Z9049 Acquired absence of other specified parts of digestive tract: Secondary | ICD-10-CM | POA: Diagnosis not present

## 2019-03-09 DIAGNOSIS — C771 Secondary and unspecified malignant neoplasm of intrathoracic lymph nodes: Secondary | ICD-10-CM | POA: Diagnosis not present

## 2019-03-09 DIAGNOSIS — R918 Other nonspecific abnormal finding of lung field: Secondary | ICD-10-CM | POA: Diagnosis not present

## 2019-03-09 DIAGNOSIS — C159 Malignant neoplasm of esophagus, unspecified: Secondary | ICD-10-CM | POA: Diagnosis not present

## 2019-03-09 DIAGNOSIS — J90 Pleural effusion, not elsewhere classified: Secondary | ICD-10-CM | POA: Diagnosis not present

## 2019-03-09 DIAGNOSIS — G629 Polyneuropathy, unspecified: Secondary | ICD-10-CM | POA: Diagnosis not present

## 2019-03-09 DIAGNOSIS — R5383 Other fatigue: Secondary | ICD-10-CM | POA: Diagnosis not present

## 2019-03-23 DIAGNOSIS — C159 Malignant neoplasm of esophagus, unspecified: Secondary | ICD-10-CM | POA: Diagnosis not present

## 2019-04-02 ENCOUNTER — Other Ambulatory Visit: Payer: Self-pay

## 2019-04-02 ENCOUNTER — Ambulatory Visit (INDEPENDENT_AMBULATORY_CARE_PROVIDER_SITE_OTHER): Payer: Medicare Other

## 2019-04-02 DIAGNOSIS — Z23 Encounter for immunization: Secondary | ICD-10-CM

## 2019-04-03 ENCOUNTER — Other Ambulatory Visit: Payer: Self-pay | Admitting: Family Medicine

## 2019-04-06 DIAGNOSIS — Z9049 Acquired absence of other specified parts of digestive tract: Secondary | ICD-10-CM | POA: Diagnosis not present

## 2019-04-06 DIAGNOSIS — Z87891 Personal history of nicotine dependence: Secondary | ICD-10-CM | POA: Diagnosis not present

## 2019-04-06 DIAGNOSIS — Z8501 Personal history of malignant neoplasm of esophagus: Secondary | ICD-10-CM | POA: Diagnosis not present

## 2019-04-06 DIAGNOSIS — Z79899 Other long term (current) drug therapy: Secondary | ICD-10-CM | POA: Diagnosis not present

## 2019-04-06 DIAGNOSIS — R5383 Other fatigue: Secondary | ICD-10-CM | POA: Diagnosis not present

## 2019-04-06 DIAGNOSIS — C155 Malignant neoplasm of lower third of esophagus: Secondary | ICD-10-CM | POA: Diagnosis not present

## 2019-04-06 DIAGNOSIS — Z923 Personal history of irradiation: Secondary | ICD-10-CM | POA: Diagnosis not present

## 2019-04-06 DIAGNOSIS — C159 Malignant neoplasm of esophagus, unspecified: Secondary | ICD-10-CM | POA: Diagnosis not present

## 2019-04-06 DIAGNOSIS — C771 Secondary and unspecified malignant neoplasm of intrathoracic lymph nodes: Secondary | ICD-10-CM | POA: Diagnosis not present

## 2019-04-07 DIAGNOSIS — C159 Malignant neoplasm of esophagus, unspecified: Secondary | ICD-10-CM | POA: Diagnosis not present

## 2019-04-20 DIAGNOSIS — C159 Malignant neoplasm of esophagus, unspecified: Secondary | ICD-10-CM | POA: Diagnosis not present

## 2019-05-04 DIAGNOSIS — Z87891 Personal history of nicotine dependence: Secondary | ICD-10-CM | POA: Diagnosis not present

## 2019-05-04 DIAGNOSIS — C159 Malignant neoplasm of esophagus, unspecified: Secondary | ICD-10-CM | POA: Diagnosis not present

## 2019-05-04 DIAGNOSIS — R194 Change in bowel habit: Secondary | ICD-10-CM | POA: Diagnosis not present

## 2019-05-04 DIAGNOSIS — C155 Malignant neoplasm of lower third of esophagus: Secondary | ICD-10-CM | POA: Diagnosis not present

## 2019-05-04 DIAGNOSIS — R5383 Other fatigue: Secondary | ICD-10-CM | POA: Diagnosis not present

## 2019-05-18 DIAGNOSIS — C155 Malignant neoplasm of lower third of esophagus: Secondary | ICD-10-CM | POA: Diagnosis not present

## 2019-05-26 DIAGNOSIS — C159 Malignant neoplasm of esophagus, unspecified: Secondary | ICD-10-CM | POA: Diagnosis not present

## 2019-05-28 DIAGNOSIS — E119 Type 2 diabetes mellitus without complications: Secondary | ICD-10-CM | POA: Diagnosis not present

## 2019-05-28 LAB — HM DIABETES EYE EXAM

## 2019-06-01 DIAGNOSIS — J90 Pleural effusion, not elsewhere classified: Secondary | ICD-10-CM | POA: Diagnosis not present

## 2019-06-01 DIAGNOSIS — K5900 Constipation, unspecified: Secondary | ICD-10-CM | POA: Diagnosis not present

## 2019-06-01 DIAGNOSIS — I871 Compression of vein: Secondary | ICD-10-CM | POA: Diagnosis not present

## 2019-06-01 DIAGNOSIS — R918 Other nonspecific abnormal finding of lung field: Secondary | ICD-10-CM | POA: Diagnosis not present

## 2019-06-01 DIAGNOSIS — Z87891 Personal history of nicotine dependence: Secondary | ICD-10-CM | POA: Diagnosis not present

## 2019-06-01 DIAGNOSIS — R5383 Other fatigue: Secondary | ICD-10-CM | POA: Diagnosis not present

## 2019-06-01 DIAGNOSIS — C771 Secondary and unspecified malignant neoplasm of intrathoracic lymph nodes: Secondary | ICD-10-CM | POA: Diagnosis not present

## 2019-06-01 DIAGNOSIS — C159 Malignant neoplasm of esophagus, unspecified: Secondary | ICD-10-CM | POA: Diagnosis not present

## 2019-06-01 DIAGNOSIS — Z9049 Acquired absence of other specified parts of digestive tract: Secondary | ICD-10-CM | POA: Diagnosis not present

## 2019-06-01 DIAGNOSIS — J984 Other disorders of lung: Secondary | ICD-10-CM | POA: Diagnosis not present

## 2019-06-01 DIAGNOSIS — G629 Polyneuropathy, unspecified: Secondary | ICD-10-CM | POA: Diagnosis not present

## 2019-06-01 DIAGNOSIS — C155 Malignant neoplasm of lower third of esophagus: Secondary | ICD-10-CM | POA: Diagnosis not present

## 2019-06-10 ENCOUNTER — Ambulatory Visit (INDEPENDENT_AMBULATORY_CARE_PROVIDER_SITE_OTHER): Payer: Medicare Other | Admitting: Family Medicine

## 2019-06-10 ENCOUNTER — Encounter: Payer: Self-pay | Admitting: Family Medicine

## 2019-06-10 ENCOUNTER — Other Ambulatory Visit: Payer: Self-pay

## 2019-06-10 VITALS — BP 118/60 | HR 71 | Temp 97.7°F | Ht 71.0 in | Wt 213.0 lb

## 2019-06-10 DIAGNOSIS — E038 Other specified hypothyroidism: Secondary | ICD-10-CM

## 2019-06-10 DIAGNOSIS — E7800 Pure hypercholesterolemia, unspecified: Secondary | ICD-10-CM

## 2019-06-10 DIAGNOSIS — E1169 Type 2 diabetes mellitus with other specified complication: Secondary | ICD-10-CM

## 2019-06-10 DIAGNOSIS — I2510 Atherosclerotic heart disease of native coronary artery without angina pectoris: Secondary | ICD-10-CM | POA: Diagnosis not present

## 2019-06-10 DIAGNOSIS — I10 Essential (primary) hypertension: Secondary | ICD-10-CM

## 2019-06-10 LAB — POCT GLYCOSYLATED HEMOGLOBIN (HGB A1C): Hemoglobin A1C: 5.6 % (ref 4.0–5.6)

## 2019-06-10 LAB — LIPID PANEL
Cholesterol: 87 mg/dL (ref 0–200)
HDL: 28 mg/dL — ABNORMAL LOW (ref >39.00)
LDL Cholesterol: 38 mg/dL (ref 0–99)
NonHDL: 59.39
Total CHOL/HDL Ratio: 3
Triglycerides: 108 mg/dL (ref 0.0–149.0)
VLDL: 21.6 mg/dL (ref 0.0–40.0)

## 2019-06-10 LAB — TSH: TSH: 0.12 u[IU]/mL — ABNORMAL LOW (ref 0.35–4.50)

## 2019-06-10 MED ORDER — LEVOTHYROXINE SODIUM 175 MCG PO TABS: ORAL_TABLET | ORAL | 3 refills | Status: DC

## 2019-06-10 MED ORDER — METOPROLOL TARTRATE 50 MG PO TABS: 50.0000 mg | ORAL_TABLET | Freq: Two times a day (BID) | ORAL | 3 refills | Status: AC

## 2019-06-10 MED ORDER — ATORVASTATIN CALCIUM 80 MG PO TABS: 80.0000 mg | ORAL_TABLET | Freq: Every day | ORAL | 3 refills | Status: AC

## 2019-06-10 NOTE — Progress Notes (Signed)
Subjective:     Patient ID: [name], male   DOB: [birth year], 78 y.o.   MRN: [id number]  HPI   [name] has esophagus cancer followed at [facility].  dxed 2016.  He is on 5-FU every 2 weeks.  He has had some adverse issues with chemo but overall tolerating fairly well.  He is here today for medical follow-up  His other medical problems include history of obesity, CAD, hypertension, type 2 diabetes, hypothyroidism.  His diabetes improved with weight loss with his cancer and has been very stable.  Currently not taking any diabetic medications  A1c today 5.6%.  He remains on levothyroxine, metoprolol, and atorvastatin and needs refills of all of those.  No recent chest pains.  No swallowing difficulties.  No recent fevers or chills.  He has heart murmur from aortic stenosis but no progressive dyspnea or dizziness or syncope.  Past Medical History:  Diagnosis Date  . Aortic stenosis   . CAD, NATIVE VESSEL 04/13/2008  . DM 05/08/2010  . Esophageal cancer ([place])   . Gall bladder stones 07/20/2014  . 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 Hx of cardiovascular stress test    Lexiscan Myoview 4/16:  No ischemia, EF 52%  . HYPERLIPIDEMIA 05/08/2010  . HYPERTENSION, BENIGN 04/13/2008  . HYPOTHYROIDISM 05/08/2010  . Radiation 08/10/14-09/21/14   50.4 gray to esophagus   Past Surgical History:  Procedure Laterality Date  . CORONARY ARTERY BYPASS GRAFT  2002  . ESOPHAGOGASTRODUODENOSCOPY ENDOSCOPY  07/12/14  . EUS N/A 07/22/2014   Procedure: UPPER ENDOSCOPIC ULTRASOUND (EUS) LINEAR;  Surgeon: Milus Banister, MD;  Location: [facility];  Service: Endoscopy;  Laterality: N/A;    reports that he quit smoking about 19 years ago. His smoking use included cigarettes. He has a 30.00 pack-year smoking history. He quit smokeless tobacco use about 18 years ago. He reports current alcohol use. He reports that he does not use drugs. family history includes  Alcohol abuse in his father; Colon cancer (age of onset: 40) in his father; Coronary artery disease in his brother and mother; Diabetes in his mother; Diabetes type II in his mother; Hypertension in his mother. No Known Allergies  Wt Readings from Last 3 Encounters:  06/10/19 213 lb (96.6 kg)  07/14/18 237 lb 1.6 oz (107.5 kg)  12/30/17 232 lb 9.6 oz (105.5 kg)     Review of Systems  Constitutional: Negative for chills and fever.  HENT: Negative for trouble swallowing.   Respiratory: Negative for cough.   Cardiovascular: Negative for chest pain and leg swelling.  Gastrointestinal: Negative for abdominal pain.  Genitourinary: Negative for dysuria.  Neurological: Negative for dizziness.       Objective:   Physical Exam Vitals signs reviewed.  Constitutional:      Appearance: Normal appearance.  Neck:     Musculoskeletal: Neck supple.  Cardiovascular:     Rate and Rhythm: Normal rate and regular rhythm.  Pulmonary:     Effort: Pulmonary effort is normal.     Breath sounds: Normal breath sounds.  Lymphadenopathy:     Cervical: No cervical adenopathy.  Neurological:     Mental Status: He is alert.        Assessment:     #1 esophagus cancer followed at [facility] and currently treated with 5-FU chemotherapy every 2 weeks  #2 hypertension stable and at goal  #3 hypothyroidism  #4 history of CAD  with goal LDL less than 70  #5 history of aortic stenosis-stable symptomatically with most recent echocardiogram spring 2018    Plan:     -Check lipid panel along with TSH.  A1C, as above= 5.6 and stable off medications  -Refilled his regular medications for 1 year  -Flu vaccine already given  -Routine follow-up in 1 year and sooner as needed  Eulas Post MD [place] [facility] at [facility]

## 2019-06-12 ENCOUNTER — Other Ambulatory Visit: Payer: Self-pay

## 2019-06-12 DIAGNOSIS — E0590 Thyrotoxicosis, unspecified without thyrotoxic crisis or storm: Secondary | ICD-10-CM

## 2019-06-12 MED ORDER — LEVOTHYROXINE SODIUM 150 MCG PO TABS: 150.0000 ug | ORAL_TABLET | Freq: Every day | ORAL | 0 refills | Status: DC

## 2019-06-15 DIAGNOSIS — K219 Gastro-esophageal reflux disease without esophagitis: Secondary | ICD-10-CM | POA: Diagnosis not present

## 2019-06-15 DIAGNOSIS — Z87891 Personal history of nicotine dependence: Secondary | ICD-10-CM | POA: Diagnosis not present

## 2019-06-15 DIAGNOSIS — C155 Malignant neoplasm of lower third of esophagus: Secondary | ICD-10-CM | POA: Diagnosis not present

## 2019-06-15 DIAGNOSIS — Z6829 Body mass index (BMI) 29.0-29.9, adult: Secondary | ICD-10-CM | POA: Diagnosis not present

## 2019-06-15 DIAGNOSIS — K5900 Constipation, unspecified: Secondary | ICD-10-CM | POA: Diagnosis not present

## 2019-06-15 DIAGNOSIS — R634 Abnormal weight loss: Secondary | ICD-10-CM | POA: Diagnosis not present

## 2019-06-15 DIAGNOSIS — Z9049 Acquired absence of other specified parts of digestive tract: Secondary | ICD-10-CM | POA: Diagnosis not present

## 2019-06-15 DIAGNOSIS — C771 Secondary and unspecified malignant neoplasm of intrathoracic lymph nodes: Secondary | ICD-10-CM | POA: Diagnosis not present

## 2019-06-15 DIAGNOSIS — R5383 Other fatigue: Secondary | ICD-10-CM | POA: Diagnosis not present

## 2019-06-15 DIAGNOSIS — C159 Malignant neoplasm of esophagus, unspecified: Secondary | ICD-10-CM | POA: Diagnosis not present

## 2019-08-12 ENCOUNTER — Other Ambulatory Visit (INDEPENDENT_AMBULATORY_CARE_PROVIDER_SITE_OTHER): Payer: Medicare Other

## 2019-08-12 ENCOUNTER — Other Ambulatory Visit: Payer: Self-pay

## 2019-08-12 DIAGNOSIS — E0590 Thyrotoxicosis, unspecified without thyrotoxic crisis or storm: Secondary | ICD-10-CM | POA: Diagnosis not present

## 2019-08-12 LAB — TSH: TSH: 5.06 u[IU]/mL — ABNORMAL HIGH (ref 0.35–4.50)

## 2019-08-13 ENCOUNTER — Encounter: Payer: Self-pay | Admitting: Family Medicine

## 2019-09-01 ENCOUNTER — Encounter: Payer: Self-pay | Admitting: Family Medicine

## 2019-09-02 MED ORDER — LEVOTHYROXINE SODIUM 150 MCG PO TABS: 150.0000 ug | ORAL_TABLET | Freq: Every day | ORAL | 0 refills | Status: DC

## 2019-09-26 MED ORDER — METOPROLOL TARTRATE 50 MG PO TABS
50.00 | ORAL_TABLET | ORAL | Status: DC
Start: 2019-09-26 — End: 2019-09-26

## 2019-09-26 MED ORDER — ENOXAPARIN SODIUM 40 MG/0.4ML ~~LOC~~ SOLN
40.00 | SUBCUTANEOUS | Status: DC
Start: 2019-09-27 — End: 2019-09-26

## 2019-09-26 MED ORDER — DEXTROSE 50 % IV SOLN
12.50 | INTRAVENOUS | Status: DC
Start: ? — End: 2019-09-26

## 2019-09-26 MED ORDER — POLYETHYLENE GLYCOL 3350 17 GM/SCOOP PO POWD
17.00 | ORAL | Status: DC
Start: 2019-09-27 — End: 2019-09-26

## 2019-09-26 MED ORDER — SENNOSIDES-DOCUSATE SODIUM 8.6-50 MG PO TABS
2.00 | ORAL_TABLET | ORAL | Status: DC
Start: 2019-09-26 — End: 2019-09-26

## 2019-09-26 MED ORDER — ONDANSETRON HCL 4 MG/2ML IJ SOLN
4.00 | INTRAMUSCULAR | Status: DC
Start: ? — End: 2019-09-26

## 2019-09-26 MED ORDER — GLUCAGON (RDNA) 1 MG IJ KIT
1.00 | PACK | INTRAMUSCULAR | Status: DC
Start: ? — End: 2019-09-26

## 2019-09-26 MED ORDER — ATORVASTATIN CALCIUM 80 MG PO TABS
80.00 | ORAL_TABLET | ORAL | Status: DC
Start: 2019-09-27 — End: 2019-09-26

## 2019-09-26 MED ORDER — MOXIFLOXACIN HCL 400 MG PO TABS
400.00 | ORAL_TABLET | ORAL | Status: DC
Start: 2019-09-27 — End: 2019-09-26

## 2019-09-26 MED ORDER — LEVOTHYROXINE SODIUM 75 MCG PO TABS
150.00 | ORAL_TABLET | ORAL | Status: DC
Start: 2019-09-27 — End: 2019-09-26

## 2019-09-26 MED ORDER — ONDANSETRON HCL 8 MG PO TABS
8.00 | ORAL_TABLET | ORAL | Status: DC
Start: ? — End: 2019-09-26

## 2019-09-26 MED ORDER — BENZONATATE 100 MG PO CAPS
100.00 | ORAL_CAPSULE | ORAL | Status: DC
Start: 2019-09-26 — End: 2019-09-26

## 2019-09-26 MED ORDER — PANTOPRAZOLE SODIUM 40 MG PO TBEC
40.00 | DELAYED_RELEASE_TABLET | ORAL | Status: DC
Start: 2019-09-27 — End: 2019-09-26

## 2019-09-26 MED ORDER — ASPIRIN 81 MG PO TBEC
81.00 | DELAYED_RELEASE_TABLET | ORAL | Status: DC
Start: 2019-09-27 — End: 2019-09-26

## 2019-09-26 MED ORDER — POLYETHYLENE GLYCOL 3350 17 GM/SCOOP PO POWD
17.00 | ORAL | Status: DC
Start: ? — End: 2019-09-26

## 2019-09-26 MED ORDER — ACETAMINOPHEN 325 MG PO TABS
650.00 | ORAL_TABLET | ORAL | Status: DC
Start: ? — End: 2019-09-26

## 2019-09-28 ENCOUNTER — Ambulatory Visit: Payer: Medicare Other | Admitting: General Practice

## 2019-10-27 ENCOUNTER — Telehealth: Payer: Self-pay | Admitting: Family Medicine

## 2019-10-27 NOTE — Chronic Care Management (AMB) (Signed)
  Chronic Care Management   Note  10/27/2019 Name: CIRO AVALLONE MRN: [id number] DOB: [birth year]  Erven Colla is a 79 y.o. year old male who is a primary care patient of Burchette, Alinda Sierras, MD. I reached out to Erven Colla by phone today in response to a referral sent by Mr. Yoel Gilchrest [facility] PCP, Eulas Post, MD.   Mr. Thorsen was given information about Chronic Care Management services today including:  1. CCM service includes personalized support from designated clinical staff supervised by his physician, including individualized plan of care and coordination with other care providers 2. 24/7 contact phone numbers for assistance for urgent and routine care needs. 3. Service will only be billed when office clinical staff spend 20 minutes or more in a month to coordinate care. 4. Only one practitioner may furnish and bill the service in a calendar month. 5. The patient may stop CCM services at any time (effective at the end of the month) by phone call to the office staff.   Patient agreed to services and verbal consent obtained.   Follow up plan:   Raynicia Dukes [employer] Scheduler

## 2019-11-03 ENCOUNTER — Encounter: Payer: Self-pay | Admitting: Cardiology

## 2019-11-03 ENCOUNTER — Telehealth (INDEPENDENT_AMBULATORY_CARE_PROVIDER_SITE_OTHER): Payer: Medicare Other | Admitting: Cardiology

## 2019-11-03 ENCOUNTER — Telehealth: Payer: Self-pay | Admitting: *Deleted

## 2019-11-03 VITALS — BP 137/80 | HR 82 | Ht 71.0 in | Wt 200.0 lb

## 2019-11-03 DIAGNOSIS — Z87891 Personal history of nicotine dependence: Secondary | ICD-10-CM

## 2019-11-03 DIAGNOSIS — R9431 Abnormal electrocardiogram [ECG] [EKG]: Secondary | ICD-10-CM | POA: Diagnosis not present

## 2019-11-03 DIAGNOSIS — I10 Essential (primary) hypertension: Secondary | ICD-10-CM

## 2019-11-03 DIAGNOSIS — I350 Nonrheumatic aortic (valve) stenosis: Secondary | ICD-10-CM | POA: Diagnosis not present

## 2019-11-03 DIAGNOSIS — Z951 Presence of aortocoronary bypass graft: Secondary | ICD-10-CM

## 2019-11-03 DIAGNOSIS — C159 Malignant neoplasm of esophagus, unspecified: Secondary | ICD-10-CM

## 2019-11-03 NOTE — Progress Notes (Addendum)
Virtual Visit via Telephone Note   This visit type was conducted due to national recommendations for restrictions regarding the COVID-19 Pandemic (e.g. social distancing) in an effort to limit this patient's exposure and mitigate transmission in our community.  Due to his co-morbid illnesses, this patient is at least at moderate risk for complications without adequate follow up.  This format is felt to be most appropriate for this patient at this time.  The patient did not have access to video technology/had technical difficulties with video requiring transitioning to audio format only (telephone).  All issues noted in this document were discussed and addressed.  No physical exam could be performed with this format.  Please refer to the patient's chart for his  consent to telehealth for [facility].   The patient was identified using 2 identifiers.  Date:  11/03/2019   ID:  [name], DOB 1941-01-23, MRN [id number]  Patient Location: Home Provider Location: Home  PCP:  Eulas Post, MD  Cardiologist:  Dr Stanford Breed Electrophysiologist:  None   Evaluation Performed:  Follow-Up Visit  Chief Complaint:  none  History of Present Illness:    [name] is a 79 y.o. male with a history of aortic stenosis, hypertension, dyslipidemia, CABG in 2002, and esophageal cancer.  The patient is followed closely by [facility] oncology.  Echocardiogram in May 2019 showed normal LV function with mild LVH, mean gradient of 13 mmHg with a peak of 24 mmHg.  His last office visit with Dr. Stanford Breed was in 2019.  In March 2021 he had some complications from his esophageal cancer and developed a pleural effusion.  He did have an echocardiogram at that time which showed some progression in his aortic stenosis.  His mean gradient was 22 mmHg, peak 45 mmHg and was described as moderate aortic stenosis.  He was contacted today for routine follow-up.  He continues to be closely followed by [facility] oncology and  apparently he was stable at a recent visit. He continues with 5Fu.  A trial was considered but because of a prolonged QTc (477) he was not felt to be a candidate.   The patient does not have symptoms concerning for COVID-19 infection (fever, chills, cough, or new shortness of breath).    Past Medical History:  Diagnosis Date  . Aortic stenosis   . CAD, NATIVE VESSEL 04/13/2008  . DM 05/08/2010  . Esophageal cancer ([place])   . Gall bladder stones 07/20/2014  . 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 Hx of cardiovascular stress test    Lexiscan Myoview 4/16:  No ischemia, EF 52%  . HYPERLIPIDEMIA 05/08/2010  . HYPERTENSION, BENIGN 04/13/2008  . HYPOTHYROIDISM 05/08/2010  . Radiation 08/10/14-09/21/14   50.4 gray to esophagus   Past Surgical History:  Procedure Laterality Date  . CORONARY ARTERY BYPASS GRAFT  2002  . ESOPHAGOGASTRODUODENOSCOPY ENDOSCOPY  07/12/14  . EUS N/A 07/22/2014   Procedure: UPPER ENDOSCOPIC ULTRASOUND (EUS) LINEAR;  Surgeon: Milus Banister, MD;  Location: [facility] ENDOSCOPY;  Service: Endoscopy;  Laterality: N/A;     Current Meds  Medication Sig  . omeprazole (PRILOSEC) 40 MG capsule Take 40 mg by mouth daily.  . ondansetron (ZOFRAN) 8 MG tablet Take by mouth every 8 (eight) hours as needed for nausea or vomiting.  . prochlorperazine (COMPAZINE) 10 MG tablet Take 10 mg by mouth every 6 (six) hours as needed for nausea or vomiting.  Allergies:   Patient has no known allergies.   Social History   Tobacco Use  . Smoking status: Former Smoker    Packs/day: 1.00    Years: 30.00    Pack years: 30.00    Types: Cigarettes    Quit date: 07/30/1999    Years since quitting: 20.2  . Smokeless tobacco: Former Systems developer    Quit date: 07/18/2000  Substance Use Topics  . Alcohol use: Yes    Alcohol/week: 0.0 standard drinks    Comment: rare - used to drink daily before 2001  . Drug use: No     Family Hx: The patient's family history  includes Alcohol abuse in his father; Colon cancer (age of onset: 18) in his father; Coronary artery disease in his brother and mother; Diabetes in his mother; Diabetes type II in his mother; Hypertension in his mother. There is no history of Heart attack or Stroke.  ROS:   Please see the history of present illness.    All other systems reviewed and are negative.   Prior CV studies:   The following studies were reviewed today: Echo ([facility]) 09/24/2019- Normal LVF, moderate AS,  Labs/Other Tests and Data Reviewed:    EKG:  An ECG dated 09/24/2019 from [facility] was personally reviewed today and demonstrated:  NSR-ST 102, LVH, prolonged QTc 477  Recent Labs: 08/12/2019: TSH 5.06   Recent Lipid Panel Lab Results  Component Value Date/Time   CHOL 87 06/10/2019 10:41 AM   TRIG 108.0 06/10/2019 10:41 AM   HDL 28.00 (L) 06/10/2019 10:41 AM   CHOLHDL 3 06/10/2019 10:41 AM   LDLCALC 38 06/10/2019 10:41 AM   LDLDIRECT 57.7 08/24/2011 09:59 AM    Wt Readings from Last 3 Encounters:  11/03/19 200 lb (90.7 kg)  06/10/19 213 lb (96.6 kg)  07/14/18 237 lb 1.6 oz (107.5 kg)     Objective:    Vital Signs:  BP 137/80   Pulse 82   Ht 5\' 11"  (1.803 m)   Wt 200 lb (90.7 kg)   BMI 27.89 kg/m    VITAL SIGNS:  reviewed  ASSESSMENT & PLAN:    Moderate AS- See recent echo March 2021 from [facility]  Esophageal CA- Followed by Dr Fanny Skates at [facility]- pt is on 5FU  HTN- Controlled, mild LVH on echo  Prolonged QTc- 477 by EKG March 2021  HLD- On high dose statin Rx  H/O CABG- CABG 2002-Myoview 2016 no ischemia  Plan:  Follow up with Dr Stanford Breed in one year. I'll discuss the timing of his next echo with Dr Stanford Breed.  COVID-19 Education: The signs and symptoms of COVID-19 were discussed with the patient and how to seek care for testing (follow up with PCP or arrange E-visit).  The importance of social distancing was discussed today.  Time:   Today, I have spent 15 minutes with the patient with  telehealth technology discussing the above problems.     Medication Adjustments/Labs and Tests Ordered: Current medicines are reviewed at length with the patient today.  Concerns regarding medicines are outlined above.   Tests Ordered: No orders of the defined types were placed in this encounter.   Medication Changes: No orders of the defined types were placed in this encounter.   Follow Up:  In Person in one year with Dr Stanford Breed  Signed, Kerin Ransom, PA-C  11/03/2019 11:34 AM    [place]

## 2019-11-03 NOTE — Addendum Note (Signed)
Addended by: Claude Manges on: 11/03/2019 04:05 PM   Modules accepted: Orders

## 2019-11-03 NOTE — Patient Instructions (Signed)
Medication Instructions:   Your physician recommends that you continue on your current medications as directed. Please refer to the Current Medication list given to you today.  *If you need a refill on your cardiac medications before your next appointment, please call your pharmacy*   Lab Work: [place]   If you have labs (blood work) drawn today and your tests are completely normal, you will receive your results only by: Marland Kitchen MyChart Message (if you have MyChart) OR . A paper copy in the mail If you have any lab test that is abnormal or we need to change your treatment, we will call you to review the results.   Testing/Procedures: NONE ORDERED  TODAY   Follow-Up: At [facility], you and your health needs are our priority.  As part of our continuing mission to provide you with exceptional heart care, we have created designated Provider Care Teams.  These Care Teams include your primary Cardiologist (physician) and Advanced Practice Providers (APPs -  Physician Assistants and Nurse Practitioners) who all work together to provide you with the care you need, when you need it.  We recommend signing up for the patient portal called "MyChart".  Sign up information is provided on this After Visit Summary.  MyChart is used to connect with patients for Virtual Visits (Telemedicine).  Patients are able to view lab/test results, encounter notes, upcoming appointments, etc.  Non-urgent messages can be sent to your provider as well.   To learn more about what you can do with MyChart, go to [email or web address].    Your next appointment:   1 year(s)  The format for your next appointment:   In Person  Provider:   You may see Dr. Stanford Breed or one of the following Advanced Practice Providers on your designated Care Team:    Kerin Ransom, PA-C  [place], [place]  Coletta Memos, [place]    Other Instructions

## 2019-11-03 NOTE — Telephone Encounter (Signed)
  Patient Consent for Virtual Visit         Sham Christlieb [facility] has provided verbal consent on 11/03/2019 for a virtual visit (video or telephone).   CONSENT FOR VIRTUAL VISIT FOR:  Kerin Ransom PA-C   By participating in this virtual visit I agree to the following:  I hereby voluntarily request, consent and authorize [facility] and its employed or contracted physicians, physician assistants, nurse practitioners or other licensed health care professionals (the Practitioner), to provide me with telemedicine health care services (the "Services") as deemed necessary by the treating Practitioner. I acknowledge and consent to receive the Services by the Practitioner via telemedicine. I understand that the telemedicine visit will involve communicating with the Practitioner through live audiovisual communication technology and the disclosure of certain medical information by electronic transmission. I acknowledge that I have been given the opportunity to request an in-person assessment or other available alternative prior to the telemedicine visit and am voluntarily participating in the telemedicine visit.  I understand that I have the right to withhold or withdraw my consent to the use of telemedicine in the course of my care at any time, without affecting my right to future care or treatment, and that the Practitioner or I may terminate the telemedicine visit at any time. I understand that I have the right to inspect all information obtained and/or recorded in the course of the telemedicine visit and may receive copies of available information for a reasonable fee.  I understand that some of the potential risks of receiving the Services via telemedicine include:  Marland Kitchen Delay or interruption in medical evaluation due to technological equipment failure or disruption; . Information transmitted may not be sufficient (e.g. poor resolution of images) to allow for appropriate medical decision making by the  Practitioner; and/or  . In rare instances, security protocols could fail, causing a breach of personal health information.  Furthermore, I acknowledge that it is my responsibility to provide information about my medical history, conditions and care that is complete and accurate to the best of my ability. I acknowledge that Practitioner's advice, recommendations, and/or decision may be based on factors not within their control, such as incomplete or inaccurate data provided by me or distortions of diagnostic images or specimens that may result from electronic transmissions. I understand that the practice of medicine is not an exact science and that Practitioner makes no warranties or guarantees regarding treatment outcomes. I acknowledge that a copy of this consent can be made available to me via my patient portal ([place]), or I can request a printed copy by calling the office of [facility].    I understand that my insurance will be billed for this visit.   I have read or had this consent read to me. . I understand the contents of this consent, which adequately explains the benefits and risks of the Services being provided via telemedicine.  . I have been provided ample opportunity to ask questions regarding this consent and the Services and have had my questions answered to my satisfaction. . I give my informed consent for the services to be provided through the use of telemedicine in my medical care

## 2019-11-20 ENCOUNTER — Other Ambulatory Visit: Payer: Self-pay

## 2019-11-20 DIAGNOSIS — I10 Essential (primary) hypertension: Secondary | ICD-10-CM

## 2019-11-20 DIAGNOSIS — E7800 Pure hypercholesterolemia, unspecified: Secondary | ICD-10-CM

## 2019-11-20 NOTE — Telephone Encounter (Signed)
Referral has been placed. 

## 2019-11-25 ENCOUNTER — Ambulatory Visit: Payer: Medicare Other

## 2019-11-25 ENCOUNTER — Other Ambulatory Visit: Payer: Self-pay

## 2019-11-25 ENCOUNTER — Other Ambulatory Visit: Payer: Self-pay | Admitting: Family Medicine

## 2019-11-25 DIAGNOSIS — E1169 Type 2 diabetes mellitus with other specified complication: Secondary | ICD-10-CM

## 2019-11-25 DIAGNOSIS — I10 Essential (primary) hypertension: Secondary | ICD-10-CM

## 2019-11-25 DIAGNOSIS — E038 Other specified hypothyroidism: Secondary | ICD-10-CM

## 2019-11-25 DIAGNOSIS — E785 Hyperlipidemia, unspecified: Secondary | ICD-10-CM

## 2019-11-25 NOTE — Chronic Care Management (AMB) (Signed)
Chronic Care Management Pharmacy  Name: [name]  MRN: [id number] DOB: [birth year]  Initial Questions: 1. Have you seen any other providers since your last visit? NA 2. Any changes in your medicines or health? No   Chief Complaint/ HPI  [name],  79 y.o. , male presents for their Initial CCM visit with the clinical pharmacist via telephone due to COVID-19 Pandemic.  PCP : Eulas Post, MD  Their chronic conditions include: DM, HTN, HLD/ aortic stenosis/ CAD, Hypothyroidism, GERD   Office Visits: 08/12/2019- Lab results- Patient advised thyroid was over replaced. Dose of thyroid medication to stay the same and to follow up in 3 months to reassess.   06/10/2019- Carolann Littler, MD- Patient presented for office visit for follow up. Patient to obtain labs: TSH, A1c, and lipid panel. Patient to follow up in 1 year and sooner as needed. Medications refilled for 1 year.   Consult Visit: 11/03/2019- Cardiology- Kerin Ransom, [place]- Patient presented for virtual visit for  follow up. Patient follows closely with [facility]. Patient recently obtain ECHO in March 2021. OTc 447 (EKG on 09/2019). HTN is controlled. For HLD, patient is on high dose statin. Patient has history of CABG- obtained Myoview in 2016- no ischemia. No medication changes. Patient to follow up in 1 year with Dr. Stanford Breed.   11/02/2019- Oncology- [facility] Costella Hatcher- Patient presented for office visit for next dose of 5FU. Patient has esophageal adenocarcinoma. Labs were reviewed and were stable. Considering addition of immunotherapy and will be discussed at next visit. Patient to get chemotherapy (5FU) and return in 2 weeks for labs and chemotherapy.   Medications: Outpatient Encounter Medications as of 11/25/2019  Medication Sig Note  . aluminum hydroxide-magnesium carbonate (GAVISCON) 95-358 MG/15ML SUSP Take by mouth as needed.   Marland Kitchen aspirin EC 81 MG tablet Take 1 tablet (81 mg total) by mouth daily.   Marland Kitchen  atorvastatin (LIPITOR) 80 MG tablet Take 1 tablet (80 mg total) by mouth daily.   Marland Kitchen levothyroxine (SYNTHROID) 150 MCG tablet TAKE 1 TABLET BY MOUTH EVERY DAY   . metoprolol tartrate (LOPRESSOR) 50 MG tablet Take 1 tablet (50 mg total) by mouth 2 (two) times daily.   . Multiple Vitamin (MULTIVITAMIN) tablet Take 1 tablet by mouth daily.   Marland Kitchen NASAL ALLERGY 24 HOUR 55 MCG/ACT AERO nasal inhaler Place 1 spray into the nose as needed.    Marland Kitchen omeprazole (PRILOSEC) 40 MG capsule Take 40 mg by mouth daily.   . ondansetron (ZOFRAN) 8 MG tablet Take by mouth every 8 (eight) hours as needed for nausea or vomiting.   . polyethylene glycol (MIRALAX / GLYCOLAX) packet Take 17 g by mouth daily as needed. 12/21/2014: Received from: [place]: Take by mouth.  . prochlorperazine (COMPAZINE) 10 MG tablet Take 10 mg by mouth every 6 (six) hours as needed for nausea or vomiting.   Glory Rosebush VERIO test strip  07/27/2014: Received from: External Pharmacy   No facility-administered encounter medications on file as of 11/25/2019.    Current Diagnosis/Assessment:  Goals Addressed            This Visit's Progress   . Pharmacy Care Plan       CARE PLAN ENTRY  Current Barriers:  . Chronic Disease Management support, education, and care coordination needs related to Hypertension, Hyperlipidemia, Diabetes, Coronary Artery Disease, and Hypothyroidism   Hypertension . Pharmacist Clinical Goal(s): o Over the next 180 days, patient will work with  PharmD and providers to maintain BP goal <130/80 . Current regimen:  o Metoprolol tartrate '50mg'$ , 1 tablet twice daily  . Patient self care activities - Over the next 180 days, patient will: o Check BP as directed, document, and provide at future appointments o Ensure daily salt intake < 2300 mg/day  Hyperlipidemia . Pharmacist Clinical Goal(s): o Over the next 180 days, patient will work with PharmD and providers to maintain LDL goal <  70 . Current regimen:   Atorvastatin '80mg'$ , 1 tablet once daily  . Patient self care activities - Over the next 180 days, patient will: o Continue current medications  Diabetes . Pharmacist Clinical Goal(s): o Over the next 180 days, patient will work with PharmD and providers to maintain A1c goal <7% . Current regimen:  o No medications  . Patient self care activities - Over the next 180 days, patient will: o Continue as is and follow up at next physical for repeat A1c.  Hypothyroidism . Pharmacist Clinical Goal(s) o Over the next 180 days, patient will work with PharmD and providers to maintain TSH 0.35 to 4.50 uIU/mL . Current regimen:  o Levothyroxine 118mg, 1 tablet once daily  . Patient self care activities - Over the next 180 days, patient will: o Continue current medication and follow up on 11/27/2019 for repeat blood work.   Medication management . Pharmacist Clinical Goal(s): o Over the next 180 days, patient will work with PharmD and providers to maintain optimal medication adherence . Current pharmacy: [facility] . Interventions o Comprehensive medication review performed. o Continue current medication management strategy . Patient self care activities - Over the next 180 days, patient will: o Take medications as prescribed o Report any questions or concerns to PharmD and/or provider(s)  Initial goal documentation.      SDOH Interventions     Most Recent Value  SDOH Interventions  Financial Strain Interventions  Intervention Not Indicated  Transportation Interventions  Intervention Not Indicated       Diabetes  Reports patient lost so much weight and ever since then has not needed DM meds.   Recent Relevant Labs: Lab Results  Component Value Date/Time   HGBA1C 5.6 06/10/2019 10:27 AM   HGBA1C 5.6 07/14/2018 09:25 AM   HGBA1C 6.0 06/25/2017 03:02 PM   HGBA1C 5.8 06/19/2016 08:26 AM   EGFR 71 (L) 09/21/2014 11:51 AM   EGFR 69 (L) 09/15/2014 08:07 AM    MICROALBUR 1.8 06/19/2016 08:26 AM   MICROALBUR 3.6 (H) 06/20/2015 09:51 AM    Patient has failed these meds in past: metformin   Patient is currently controlled on the following medications:  - no medications   Last diabetic Eye exam:  Lab Results  Component Value Date/Time   HMDIABEYEEXA No Retinopathy 05/28/2019 12:00 AM    Last diabetic Foot exam:  Lab Results  Component Value Date/Time   HMDIABFOOTEX normal except for mild callous 10/27/2013 12:00 AM    Plan Continue current medications    Hypertension  Denies persistent orthostatic hypotension, dizziness, lightheadedness. Notes this symptoms after chemotherapy.   Office blood pressures are  BP Readings from Last 3 Encounters:  11/27/19 120/70  11/03/19 137/80  06/10/19 118/60   Patient has failed these meds in the past: amlodipine, Valsartan/ HCTZ   Patient checks BP at home infrequently  Patient home BP readings are ranging: 130/65   Patient is controlled on:   Metoprolol tartrate '50mg'$ , 1 tablet twice daily   Plan Continue current medications   Hyperlipidemia/  aortic stenosis/ CAD/ Hx of CABG   Lipid Panel     Component Value Date/Time   CHOL 87 06/10/2019 1041   TRIG 108.0 06/10/2019 1041   HDL 28.00 (L) 06/10/2019 1041   CHOLHDL 3 06/10/2019 1041   VLDL 21.6 06/10/2019 1041   LDLCALC 38 06/10/2019 1041   LDLDIRECT 57.7 08/24/2011 0959    The ASCVD Risk score (Goff DC Jr., et al., 2013) failed to calculate for the following reasons:   The valid total cholesterol range is 130 to 320 mg/dL   Patient has failed these meds in past: Vytorin   Patient is currently controlled on the following medications:   Atorvastatin '80mg'$ , 1 tablet once daily   Aspirin '81mg'$ , 1 tablet once daily   We discussed:  diet and exercise extensively  Plan Continue current medications  Hypothyroidism  Patient reports taking levothyroxine in the morning after breakfast.   TSH  Date Value Ref Range Status   11/27/2019 1.60 0.35 - 4.50 uIU/mL Final    Patient is currently controlled on the following medications:   Levothyroxine 133mg, 1 tablet once daily   Plan Has follow up on 5/21 for repeat TSH.  Continue current medications   GERD  Spouse reports reflux and that 80% of esophagus was removed part of stomach as well.   Patient has failed these meds in past: none   Patient is currently controlled on the following medications:   Omeprazole '40mg'$ , 1 tablet once daily   Plan Continue current medications  OTC/ supportive care    Multivitamin, 1 tablet once daily (same time after breakfast)   Ondansetron '8mg'$ , 1 tablet every eight hours as needed for nausea or vomiting   Miralax, 17g once daily as needed  Prochlorperazine '10mg'$ , 1 tablet every six hours as needed for nausea or vomiting   Immuneti supplements (vitamin C, zinc, vitamin D3, garlic bulb elderberry, echinacea, black pepper), 2 capsules once daily (after breakfast)    Medication Management  Patient organizes medications: uses pill box  [facility] Adherence:  - omeprazole '40mg'$  (09/26/19 for 30DS---> 11/20/2019)  Follow up Follow up visit with PharmD in 6 months  AAnson Crofts PharmD Clinical Pharmacist LWeston MillsPrimary Care at [facility]([phone]

## 2019-11-27 ENCOUNTER — Encounter: Payer: Self-pay | Admitting: Family Medicine

## 2019-11-27 ENCOUNTER — Other Ambulatory Visit: Payer: Self-pay

## 2019-11-27 ENCOUNTER — Ambulatory Visit (INDEPENDENT_AMBULATORY_CARE_PROVIDER_SITE_OTHER): Payer: Medicare Other | Admitting: Family Medicine

## 2019-11-27 VITALS — BP 120/70 | HR 77 | Temp 97.9°F | Ht 71.0 in | Wt 194.0 lb

## 2019-11-27 DIAGNOSIS — R5383 Other fatigue: Secondary | ICD-10-CM | POA: Diagnosis not present

## 2019-11-27 DIAGNOSIS — E1169 Type 2 diabetes mellitus with other specified complication: Secondary | ICD-10-CM

## 2019-11-27 DIAGNOSIS — E038 Other specified hypothyroidism: Secondary | ICD-10-CM | POA: Diagnosis not present

## 2019-11-27 DIAGNOSIS — E7800 Pure hypercholesterolemia, unspecified: Secondary | ICD-10-CM

## 2019-11-27 DIAGNOSIS — C159 Malignant neoplasm of esophagus, unspecified: Secondary | ICD-10-CM

## 2019-11-27 LAB — TSH: TSH: 1.6 u[IU]/mL (ref 0.35–4.50)

## 2019-11-27 LAB — T4, FREE: Free T4: 1.11 ng/dL (ref 0.60–1.60)

## 2019-11-27 NOTE — Progress Notes (Signed)
Subjective:     Patient ID: [name], male   DOB: 1941-01-06, 79 y.o.   MRN: [id number]  HPI   [name] is seen for medical follow-up.  He has esophageal cancer followed at [facility].  He had admission back in March for some dehydration and large pleural effusion.  He had that drained and has done reasonably well since then.  He is getting chemotherapy with 5-FU every 2 weeks.  Has had some fairly significant fatigue after each treatment course but otherwise doing fairly well.  His appetite's been fair.  We reviewed recent labs from [facility].  Recent albumin 3.7.  He has hypothyroidism.  He had TSH back in December that was low and we backed off his thyroid medication.  He had follow-up a few months ago which was up around 5 range.  He needs follow-up thyroid functions now.  He has history of type 2 diabetes which basically has reversed with his weight loss.  His last A1c was 5.6%.  No polyuria or polydipsia.  He weighed 213 pounds back in December and current weight 194  Past Medical History:  Diagnosis Date  . Aortic stenosis   . CAD, NATIVE VESSEL 04/13/2008  . DM 05/08/2010  . Esophageal cancer ([place])   . Gall bladder stones 07/20/2014  . 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 Hx of cardiovascular stress test    Lexiscan Myoview 4/16:  No ischemia, EF 52%  . HYPERLIPIDEMIA 05/08/2010  . HYPERTENSION, BENIGN 04/13/2008  . HYPOTHYROIDISM 05/08/2010  . Radiation 08/10/14-09/21/14   50.4 gray to esophagus   Past Surgical History:  Procedure Laterality Date  . CORONARY ARTERY BYPASS GRAFT  2002  . ESOPHAGOGASTRODUODENOSCOPY ENDOSCOPY  07/12/14  . EUS N/A 07/22/2014   Procedure: UPPER ENDOSCOPIC ULTRASOUND (EUS) LINEAR;  Surgeon: Milus Banister, MD;  Location: [facility] ENDOSCOPY;  Service: Endoscopy;  Laterality: N/A;    reports that he quit smoking about 20 years ago. His smoking use included cigarettes. He has a 30.00 pack-year smoking history. He quit  smokeless tobacco use about 19 years ago. He reports current alcohol use. He reports that he does not use drugs. family history includes Alcohol abuse in his father; Colon cancer (age of onset: 11) in his father; Coronary artery disease in his brother and mother; Diabetes in his mother; Diabetes type II in his mother; Hypertension in his mother. No Known Allergies  Wt Readings from Last 3 Encounters:  11/27/19 194 lb (88 kg)  11/03/19 200 lb (90.7 kg)  06/10/19 213 lb (96.6 kg)    Review of Systems  Constitutional: Positive for fatigue. Negative for chills and fever.  Respiratory: Negative for cough and shortness of breath.   Cardiovascular: Negative for chest pain.  Gastrointestinal: Negative for abdominal pain, nausea and vomiting.  Genitourinary: Negative for dysuria.       Objective:   Physical Exam Vitals reviewed.  Constitutional:      Appearance: Normal appearance.  Cardiovascular:     Rate and Rhythm: Normal rate and regular rhythm.  Pulmonary:     Effort: Pulmonary effort is normal.     Breath sounds: Normal breath sounds.  Neurological:     Mental Status: He is alert.        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  He continues to get chemotherapy at [facility] every 2 weeks.  Increased fatigue following  treatments.   #2 hypothyroidism.  Slightly under replaced by most recent lab  #3 type 2 diabetes which reversed with weight loss    Plan:     -Recheck TSH and free T4 today -we did not see reason to repeat A1C today as recent A1Cs have been excellent with his weight loss. -He has scheduled follow up in December.  Eulas Post MD [facility]

## 2019-12-07 NOTE — Patient Instructions (Addendum)
Visit Information  Goals Addressed            This Visit's Progress   . Pharmacy Care Plan       CARE PLAN ENTRY  Current Barriers:  . Chronic Disease Management support, education, and care coordination needs related to Hypertension, Hyperlipidemia, Diabetes, Coronary Artery Disease, and Hypothyroidism   Hypertension . Pharmacist Clinical Goal(s): o Over the next 180 days, patient will work with PharmD and providers to maintain BP goal <130/80 . Current regimen:  o Metoprolol tartrate 50mg , 1 tablet twice daily  . Patient self care activities - Over the next 180 days, patient will: o Check BP as directed, document, and provide at future appointments o Ensure daily salt intake < 2300 mg/day  Hyperlipidemia . Pharmacist Clinical Goal(s): o Over the next 180 days, patient will work with PharmD and providers to maintain LDL goal < 70 . Current regimen:   Atorvastatin 80mg , 1 tablet once daily  . Patient self care activities - Over the next 180 days, patient will: o Continue current medications  Diabetes . Pharmacist Clinical Goal(s): o Over the next 180 days, patient will work with PharmD and providers to maintain A1c goal <7% . Current regimen:  o No medications  . Patient self care activities - Over the next 180 days, patient will: o Continue as is and follow up at next physical for repeat A1c.  Hypothyroidism . Pharmacist Clinical Goal(s) o Over the next 180 days, patient will work with PharmD and providers to maintain TSH 0.35 to 4.50 uIU/mL . Current regimen:  o Levothyroxine 152mcg, 1 tablet once daily  . Patient self care activities - Over the next 180 days, patient will: o Continue current medication and follow up on 11/27/2019 for repeat blood work.   Medication management . Pharmacist Clinical Goal(s): o Over the next 180 days, patient will work with PharmD and providers to maintain optimal medication adherence . Current pharmacy:  [facility] . Interventions o Comprehensive medication review performed. o Continue current medication management strategy . Patient self care activities - Over the next 180 days, patient will: o Take medications as prescribed o Report any questions or concerns to PharmD and/or provider(s)  Initial goal documentation.       [name] was given information about Chronic Care Management services today including:  1. CCM service includes personalized support from designated clinical staff supervised by his physician, including individualized plan of care and coordination with other care providers 2. 24/7 contact phone numbers for assistance for urgent and routine care needs. 3. Standard insurance, coinsurance, copays and deductibles apply for chronic care management only during months in which we provide at least 20 minutes of these services. Most insurances cover these services at 100%, however patients may be responsible for any copay, coinsurance and/or deductible if applicable. This service may help you avoid the need for more expensive face-to-face services. 4. Only one practitioner may furnish and bill the service in a calendar month. 5. The patient may stop CCM services at any time (effective at the end of the month) by phone call to the office staff.  Patient agreed to services and verbal consent obtained.   The patient verbalized understanding of instructions provided today and agreed to receive a mailed copy of patient instruction and/or educational materials. Telephone follow up appointment with pharmacy team member scheduled for: 05/09/2020  Anson Crofts, PharmD Clinical Pharmacist [facility] [phone]   [place] stands for "Dietary  Approaches to Stop Hypertension." The DASH eating plan is a healthy eating plan that has been shown to reduce high blood pressure (hypertension). It may also reduce your risk for type 2 diabetes, heart  disease, and stroke. The DASH eating plan may also help with weight loss. What are tips for following this plan?  General guidelines  Avoid eating more than 2,300 mg (milligrams) of salt (sodium) a day. If you have hypertension, you may need to reduce your sodium intake to 1,500 mg a day.  Limit alcohol intake to no more than 1 drink a day for nonpregnant women and 2 drinks a day for men. One drink equals 12 oz of beer, 5 oz of wine, or 1 oz of hard liquor.  Work with your health care provider to maintain a healthy body weight or to lose weight. Ask what an ideal weight is for you.  Get at least 30 minutes of exercise that causes your heart to beat faster (aerobic exercise) most days of the week. Activities may include walking, swimming, or biking.  Work with your health care provider or diet and nutrition specialist (dietitian) to adjust your eating plan to your individual calorie needs. Reading food labels   Check food labels for the amount of sodium per serving. Choose foods with less than 5 percent of the Daily Value of sodium. Generally, foods with less than 300 mg of sodium per serving fit into this eating plan.  To find whole grains, look for the word "whole" as the first word in the ingredient list. Shopping  Buy products labeled as "low-sodium" or "no salt added."  Buy fresh foods. Avoid canned foods and premade or frozen meals. Cooking  Avoid adding salt when cooking. Use salt-free seasonings or herbs instead of table salt or sea salt. Check with your health care provider or pharmacist before using salt substitutes.  Do not fry foods. Cook foods using healthy methods such as baking, boiling, grilling, and broiling instead.  Cook with heart-healthy oils, such as olive, canola, soybean, or sunflower oil. Meal planning  Eat a balanced diet that includes: ? 5 or more servings of fruits and vegetables each day. At each meal, try to fill half of your plate with fruits and  vegetables. ? Up to 6-8 servings of whole grains each day. ? Less than 6 oz of lean meat, poultry, or fish each day. A 3-oz serving of meat is about the same size as a deck of cards. One egg equals 1 oz. ? 2 servings of low-fat dairy each day. ? A serving of nuts, seeds, or beans 5 times each week. ? Heart-healthy fats. Healthy fats called Omega-3 fatty acids are found in foods such as flaxseeds and coldwater fish, like sardines, salmon, and mackerel.  Limit how much you eat of the following: ? Canned or prepackaged foods. ? Food that is high in trans fat, such as fried foods. ? Food that is high in saturated fat, such as fatty meat. ? Sweets, desserts, sugary drinks, and other foods with added sugar. ? Full-fat dairy products.  Do not salt foods before eating.  Try to eat at least 2 vegetarian meals each week.  Eat more home-cooked food and less restaurant, buffet, and fast food.  When eating at a restaurant, ask that your food be prepared with less salt or no salt, if possible. What foods are recommended? The items listed may not be a complete list. Talk with your dietitian about what dietary choices are best for  you. Grains Whole-grain or whole-wheat bread. Whole-grain or whole-wheat pasta. Brown rice. [place]. Bulgur. Whole-grain and low-sodium cereals. Pita bread. Low-fat, low-sodium crackers. Whole-wheat flour tortillas. Vegetables Fresh or frozen vegetables (raw, steamed, roasted, or grilled). Low-sodium or reduced-sodium tomato and vegetable juice. Low-sodium or reduced-sodium tomato sauce and tomato paste. Low-sodium or reduced-sodium canned vegetables. Fruits All fresh, dried, or frozen fruit. Canned fruit in natural juice (without added sugar). Meat and other protein foods Skinless chicken or [place]. Ground chicken or [place]. Pork with fat trimmed off. Fish and seafood. Egg whites. Dried beans, peas, or lentils. Unsalted nuts, nut butters, and seeds. Unsalted canned  beans. Lean cuts of beef with fat trimmed off. Low-sodium, lean deli meat. Dairy Low-fat (1%) or fat-free (skim) milk. Fat-free, low-fat, or reduced-fat cheeses. Nonfat, low-sodium ricotta or cottage cheese. Low-fat or nonfat yogurt. Low-fat, low-sodium cheese. Fats and oils Soft margarine without trans fats. Vegetable oil. Low-fat, reduced-fat, or light mayonnaise and salad dressings (reduced-sodium). Canola, safflower, olive, soybean, and sunflower oils. Avocado. Seasoning and other foods Herbs. Spices. Seasoning mixes without salt. Unsalted popcorn and pretzels. Fat-free sweets. What foods are not recommended? The items listed may not be a complete list. Talk with your dietitian about what dietary choices are best for you. Grains Baked goods made with fat, such as croissants, muffins, or some breads. Dry pasta or rice meal packs. Vegetables Creamed or fried vegetables. Vegetables in a cheese sauce. Regular canned vegetables (not low-sodium or reduced-sodium). Regular canned tomato sauce and paste (not low-sodium or reduced-sodium). Regular tomato and vegetable juice (not low-sodium or reduced-sodium). Angie Fava. Olives. Fruits Canned fruit in a light or heavy syrup. Fried fruit. Fruit in cream or butter sauce. Meat and other protein foods Fatty cuts of meat. Ribs. Fried meat. Berniece Salines. Sausage. Bologna and other processed lunch meats. Salami. Fatback. Hotdogs. Bratwurst. Salted nuts and seeds. Canned beans with added salt. Canned or smoked fish. Whole eggs or egg yolks. Chicken or [place] with skin. Dairy Whole or 2% milk, cream, and half-and-half. Whole or full-fat cream cheese. Whole-fat or sweetened yogurt. Full-fat cheese. Nondairy creamers. Whipped toppings. Processed cheese and cheese spreads. Fats and oils Butter. Stick margarine. Lard. Shortening. Ghee. Bacon fat. Tropical oils, such as coconut, palm kernel, or palm oil. Seasoning and other foods Salted popcorn and pretzels. Onion salt,  garlic salt, seasoned salt, table salt, and sea salt. Worcestershire sauce. Tartar sauce. Barbecue sauce. Teriyaki sauce. Soy sauce, including reduced-sodium. Steak sauce. Canned and packaged gravies. Fish sauce. Oyster sauce. Cocktail sauce. Horseradish that you find on the shelf. Ketchup. Mustard. Meat flavorings and tenderizers. Bouillon cubes. Hot sauce and Tabasco sauce. Premade or packaged marinades. Premade or packaged taco seasonings. Relishes. Regular salad dressings. Where to find more information:  National Heart, Lung, and [place]: https://wilson-eaton.com/  American Heart Association: www.heart.org Summary  The DASH eating plan is a healthy eating plan that has been shown to reduce high blood pressure (hypertension). It may also reduce your risk for type 2 diabetes, heart disease, and stroke.  With the DASH eating plan, you should limit salt (sodium) intake to 2,300 mg a day. If you have hypertension, you may need to reduce your sodium intake to 1,500 mg a day.  When on the DASH eating plan, aim to eat more fresh fruits and vegetables, whole grains, lean proteins, low-fat dairy, and heart-healthy fats.  Work with your health care provider or diet and nutrition specialist (dietitian) to adjust your eating plan to your individual calorie needs. This information is  not intended to replace advice given to you by your health care provider. Make sure you discuss any questions you have with your health care provider. Document Revised: 06/07/2017 Document Reviewed: 06/18/2016 Elsevier Patient Education  2020 [employer].

## 2020-02-15 ENCOUNTER — Encounter: Payer: Self-pay | Admitting: Family Medicine

## 2020-03-09 DEATH — deceased

## 2020-05-09 ENCOUNTER — Telehealth: Payer: Medicare Other

## 2020-06-10 ENCOUNTER — Encounter: Payer: Medicare Other | Admitting: Family Medicine
